# Patient Record
Sex: Female | Born: 1935 | Race: White | Hispanic: No | Smoking: Never smoker
Health system: Southern US, Community
[De-identification: ages and names within clinical notes are randomized; demographics above are authoritative.]

## PROBLEM LIST (undated history)

## (undated) DIAGNOSIS — E785 Hyperlipidemia, unspecified: Secondary | ICD-10-CM

## (undated) DIAGNOSIS — R42 Dizziness and giddiness: Secondary | ICD-10-CM

## (undated) DIAGNOSIS — E079 Disorder of thyroid, unspecified: Secondary | ICD-10-CM

## (undated) DIAGNOSIS — M19019 Primary osteoarthritis, unspecified shoulder: Secondary | ICD-10-CM

## (undated) DIAGNOSIS — M48061 Spinal stenosis, lumbar region without neurogenic claudication: Secondary | ICD-10-CM

## (undated) DIAGNOSIS — C73 Malignant neoplasm of thyroid gland: Secondary | ICD-10-CM

## (undated) DIAGNOSIS — M25311 Other instability, right shoulder: Secondary | ICD-10-CM

## (undated) DIAGNOSIS — I471 Supraventricular tachycardia: Secondary | ICD-10-CM

## (undated) DIAGNOSIS — M353 Polymyalgia rheumatica: Secondary | ICD-10-CM

## (undated) DIAGNOSIS — E119 Type 2 diabetes mellitus without complications: Secondary | ICD-10-CM

## (undated) DIAGNOSIS — M810 Age-related osteoporosis without current pathological fracture: Secondary | ICD-10-CM

## (undated) DIAGNOSIS — I10 Essential (primary) hypertension: Secondary | ICD-10-CM

## (undated) HISTORY — PX: OTHER SURGICAL HISTORY: SHX169

## (undated) HISTORY — PX: THYROIDECTOMY: SHX17

## (undated) HISTORY — PX: ABDOMINAL HYSTERECTOMY: SHX81

## (undated) HISTORY — PX: TOTAL THYROIDECTOMY: SHX2547

## (undated) HISTORY — PX: APPENDECTOMY: SHX54

---

## 2004-01-16 ENCOUNTER — Inpatient Hospital Stay: Payer: Self-pay | Admitting: Internal Medicine

## 2004-06-05 ENCOUNTER — Ambulatory Visit: Payer: Self-pay | Admitting: Internal Medicine

## 2005-02-06 ENCOUNTER — Inpatient Hospital Stay: Payer: Self-pay | Admitting: Internal Medicine

## 2005-03-15 ENCOUNTER — Ambulatory Visit: Payer: Self-pay | Admitting: Infectious Diseases

## 2005-10-21 ENCOUNTER — Ambulatory Visit: Payer: Self-pay | Admitting: Internal Medicine

## 2005-12-31 ENCOUNTER — Emergency Department: Payer: Self-pay | Admitting: Emergency Medicine

## 2005-12-31 ENCOUNTER — Other Ambulatory Visit: Payer: Self-pay

## 2006-01-01 ENCOUNTER — Ambulatory Visit: Payer: Self-pay | Admitting: Emergency Medicine

## 2006-09-26 ENCOUNTER — Emergency Department: Payer: Self-pay | Admitting: Emergency Medicine

## 2006-09-26 ENCOUNTER — Other Ambulatory Visit: Payer: Self-pay

## 2007-02-18 ENCOUNTER — Emergency Department: Payer: Self-pay | Admitting: Emergency Medicine

## 2007-02-18 ENCOUNTER — Other Ambulatory Visit: Payer: Self-pay

## 2007-03-24 ENCOUNTER — Inpatient Hospital Stay: Payer: Self-pay | Admitting: Internal Medicine

## 2007-04-08 ENCOUNTER — Other Ambulatory Visit: Payer: Self-pay

## 2007-04-08 ENCOUNTER — Observation Stay: Payer: Self-pay | Admitting: Internal Medicine

## 2008-08-23 ENCOUNTER — Emergency Department: Payer: Self-pay | Admitting: Unknown Physician Specialty

## 2008-10-19 ENCOUNTER — Ambulatory Visit: Payer: Self-pay | Admitting: Rheumatology

## 2010-04-09 ENCOUNTER — Emergency Department: Payer: Self-pay | Admitting: Emergency Medicine

## 2010-04-15 ENCOUNTER — Emergency Department: Payer: Self-pay | Admitting: Emergency Medicine

## 2010-05-24 ENCOUNTER — Ambulatory Visit: Payer: Self-pay | Admitting: Internal Medicine

## 2010-09-06 ENCOUNTER — Ambulatory Visit: Payer: Self-pay | Admitting: Ophthalmology

## 2010-09-17 ENCOUNTER — Ambulatory Visit: Payer: Self-pay | Admitting: Ophthalmology

## 2010-10-10 ENCOUNTER — Ambulatory Visit: Payer: Self-pay | Admitting: Internal Medicine

## 2010-10-12 ENCOUNTER — Observation Stay: Payer: Self-pay | Admitting: Internal Medicine

## 2011-02-21 ENCOUNTER — Ambulatory Visit: Payer: Self-pay

## 2011-02-21 ENCOUNTER — Ambulatory Visit: Payer: Self-pay | Admitting: Podiatry

## 2011-05-24 ENCOUNTER — Ambulatory Visit: Payer: Self-pay | Admitting: Internal Medicine

## 2011-07-31 ENCOUNTER — Emergency Department: Payer: Self-pay | Admitting: *Deleted

## 2011-07-31 LAB — CBC WITH DIFFERENTIAL/PLATELET
Basophil %: 0.6 %
Eosinophil %: 0.7 %
HGB: 12.2 g/dL (ref 12.0–16.0)
Lymphocyte #: 1.9 10*3/uL (ref 1.0–3.6)
MCH: 28.2 pg (ref 26.0–34.0)
MCV: 85 fL (ref 80–100)
Monocyte #: 0.7 x10 3/mm (ref 0.2–0.9)
Monocyte %: 7.8 %
Neutrophil #: 6 10*3/uL (ref 1.4–6.5)
Platelet: 254 10*3/uL (ref 150–440)
RBC: 4.34 10*6/uL (ref 3.80–5.20)
WBC: 8.6 10*3/uL (ref 3.6–11.0)

## 2011-07-31 LAB — BASIC METABOLIC PANEL
BUN: 22 mg/dL — ABNORMAL HIGH (ref 7–18)
Chloride: 95 mmol/L — ABNORMAL LOW (ref 98–107)
Co2: 31 mmol/L (ref 21–32)
EGFR (Non-African Amer.): 51 — ABNORMAL LOW
Osmolality: 274 (ref 275–301)
Potassium: 4 mmol/L (ref 3.5–5.1)

## 2011-09-26 ENCOUNTER — Emergency Department: Payer: Self-pay | Admitting: Emergency Medicine

## 2011-09-26 LAB — CBC
HCT: 37 % (ref 35.0–47.0)
HGB: 12.3 g/dL (ref 12.0–16.0)
MCH: 28.6 pg (ref 26.0–34.0)
MCHC: 33.3 g/dL (ref 32.0–36.0)
MCV: 86 fL (ref 80–100)
RBC: 4.31 10*6/uL (ref 3.80–5.20)
RDW: 12.7 % (ref 11.5–14.5)
WBC: 7.3 10*3/uL (ref 3.6–11.0)

## 2011-09-26 LAB — COMPREHENSIVE METABOLIC PANEL
Anion Gap: 7 (ref 7–16)
BUN: 16 mg/dL (ref 7–18)
Bilirubin,Total: 0.4 mg/dL (ref 0.2–1.0)
Chloride: 96 mmol/L — ABNORMAL LOW (ref 98–107)
Creatinine: 0.68 mg/dL (ref 0.60–1.30)
EGFR (African American): >60
Osmolality: 270 (ref 275–301)
Potassium: 3.7 mmol/L (ref 3.5–5.1)
Sodium: 133 mmol/L — ABNORMAL LOW (ref 136–145)
Total Protein: 7.4 g/dL (ref 6.4–8.2)

## 2011-09-26 LAB — URINALYSIS, COMPLETE
Blood: NEGATIVE
Ketone: NEGATIVE
Nitrite: NEGATIVE
Ph: 9 (ref 4.5–8.0)
Protein: NEGATIVE
Specific Gravity: 1.006 (ref 1.003–1.030)
WBC UR: 2 /HPF (ref 0–5)

## 2011-09-26 LAB — TROPONIN I: Troponin-I: <0.02 ng/mL

## 2011-09-26 LAB — CK TOTAL AND CKMB (NOT AT ARMC): CK-MB: 3.9 ng/mL — ABNORMAL HIGH (ref 0.5–3.6)

## 2012-01-10 ENCOUNTER — Ambulatory Visit: Payer: Self-pay | Admitting: Internal Medicine

## 2012-03-02 ENCOUNTER — Ambulatory Visit: Payer: Self-pay | Admitting: Orthopedic Surgery

## 2012-03-24 ENCOUNTER — Ambulatory Visit: Payer: Self-pay | Admitting: Vascular Surgery

## 2012-03-24 LAB — BASIC METABOLIC PANEL
Anion Gap: 8 (ref 7–16)
BUN: 30 mg/dL — ABNORMAL HIGH (ref 7–18)
Calcium, Total: 9.7 mg/dL (ref 8.5–10.1)
Chloride: 97 mmol/L — ABNORMAL LOW (ref 98–107)
Co2: 26 mmol/L (ref 21–32)
Creatinine: 1.05 mg/dL (ref 0.60–1.30)
EGFR (African American): 55 — ABNORMAL LOW
EGFR (Non-African Amer.): 47 — ABNORMAL LOW
Sodium: 131 mmol/L — ABNORMAL LOW (ref 136–145)

## 2012-11-26 ENCOUNTER — Ambulatory Visit: Payer: Self-pay | Admitting: Orthopedic Surgery

## 2014-05-20 NOTE — Op Note (Signed)
PATIENT NAME:  Amanda Stark, Amanda Stark MR#:  [id number] DATE OF BIRTH:  May 08, 1922  DATE OF PROCEDURE:  03/24/2012  PREOPERATIVE DIAGNOSIS:  Hematoma, right chest wall and right arm.   POSTOPERATIVE DIAGNOSIS:  Hematoma, right chest wall and right arm.   PROCEDURE PERFORMED:  Venography, right upper extremity.   SURGEON:  Hortencia Pilar, MD  SEDATION:  Versed 3 mg, plus fentanyl 100 mcg administered IV.   Continuous ECG, pulse oximetry, and cardiopulmonary monitoring were performed throughout the entire procedure by the interventional radiology nurse.   Total sedation time was 45 minutes.   ACCESS:  5-French catheter, right basilic vein.   CONTRAST USED:  Isovue 30 mL.   FLUOROSCOPY TIME:  1.1 minutes.   INDICATIONS:  The patient is an 81 year old woman who developed a spontaneous chest wall and right breast hematoma that extended into the axillary area, and subsequently has caused significant swelling and pain in the right upper extremity. Concerns for venous obstruction secondary to external compression were raised, and the patient is therefore undergoing venography to better evaluate the central venous system, in particular the subclavian and axillary veins.   The risks and benefits were reviewed. All questions answered. The patient has agreed to proceed.   PROCEDURE:  The patient was taken to special procedures and placed in the supine position. After adequate sedation was achieved her right arm was extended, prepped and draped in a sterile fashion. Ultrasound was placed in a sterile sleeve. Venous anatomy was examined with the ultrasound as well as the area of hematoma in the right upper arm. The hematoma was clearly compressing the paired brachial veins of the right upper extremity. The cephalic vein was somewhat enlarged the antecubital fossa, and then courses over the hematoma toward the shoulder. The basilic vein appears to be uninvolved and widely patent. It is echolucent and  compressible, indicating patency. Images were recorded for the permanent record, and the puncture with a micropuncture kit is made under direct visualization. A MicroSheath was inserted and hand injection of contrast is utilized through the [employer]. Images in the arm were quite good, but the images of the subclavian and central veins are inadequate from this level, and ultimately a floppy Glidewire was extended, A KMP catheter, but these images still remained uncertain, and therefore a multi-sidehole pigtail catheter was advanced up into the central veins and hand injection of contrast was utilized to demonstrate the complete venous anatomy. After looking at the images, the catheter was removed, pressure was held, and there were no immediate complications.   INTERPRETATION:  The venous anatomy is as described above. The basilic vein is widely patent. Cephalic vein appears to be, patent but is somewhat dilated more distally secondary to  external compression as it courses over the hematoma in the upper arm. Brachial veins appear to be essentially occluded by compression from the hematoma. The axillary vein is widely patent, as is the subclavian, innominate, and superior vena cava. The area of concern appears to have been the confluence of the cephalic vein with the subclavian and non-contrasted blood flowing from the cephalic into the subclavian at that point, with the pigtail catheter in position and a magnified image.   There does not appear to be any evidence of stricture or stenosis throughout the axillary, subclavian, innominate system.    ____________________________ Katha Cabal, MD ggs:dm D: 03/25/2012 08:12:00 ET T: 03/25/2012 09:47:38 ET JOB#: [id number]  cc: Rusty Aus, MD Katha Cabal, MD, <Dictator>    [name] Lory  [facility] MD ELECTRONICALLY SIGNED 04/28/2012 17:14

## 2014-05-20 NOTE — Op Note (Signed)
PATIENT NAME:  Amanda Stark, Amanda Stark MR#:  [id number] DATE OF BIRTH:  March 10, 1922  DATE OF PROCEDURE:  03/25/2012   PREOPERATIVE DIAGNOSIS:  Hematoma, right chest wall and right arm.   POSTOPERATIVE DIAGNOSIS:  Hematoma, right chest wall and right arm.   PROCEDURE PERFORMED:  Venography, right upper extremity.   SURGEON:  Hortencia Pilar, MD  SEDATION:  Versed 3 mg, plus fentanyl 100 mcg administered IV.   Continuous ECG, pulse oximetry, and cardiopulmonary monitoring were performed throughout the entire procedure by the interventional radiology nurse.   Total sedation time was 45 minutes.   ACCESS:  5-French catheter, right basilic vein.   CONTRAST USED:  Isovue 30 mL.   FLUOROSCOPY TIME:  1.1 minutes.   INDICATIONS:  The patient is an 81 year old woman who developed a spontaneous chest wall and right breast hematoma that extended into the axillary area, and subsequently has caused significant swelling and pain in the right upper extremity. Concerns for venous obstruction secondary to external compression were raised, and the patient is therefore undergoing venography to better evaluate the central venous system, in particular the subclavian and axillary veins.   The risks and benefits were reviewed. All questions answered. The patient has agreed to proceed.   PROCEDURE:  The patient was taken to special procedures and placed in the supine position. After adequate sedation was achieved her right arm was extended, prepped and draped in a sterile fashion. Ultrasound was placed in a sterile sleeve. Venous anatomy was examined with the ultrasound as well as the area of hematoma in the right upper arm. The hematoma was clearly compressing the paired brachial veins of the right upper extremity. The cephalic vein was somewhat enlarged the antecubital fossa, and then courses over the hematoma toward the shoulder. The basilic vein appears to be uninvolved and widely patent. It is echolucent and  compressible, indicating patency. Images were recorded for the permanent record, and the puncture with a micropuncture kit is made under direct visualization. A MicroSheath was inserted and hand injection of contrast is utilized through the [employer]. Images in the arm were quite good, but the images of the subclavian and central veins are inadequate from this level, and ultimately a floppy Glidewire was extended, A KMP catheter, but these images still remained uncertain, and therefore a multi-sidehole pigtail catheter was advanced up into the central veins and hand injection of contrast was utilized to demonstrate the complete venous anatomy. After looking at the images, the catheter was removed, pressure was held, and there were no immediate complications.   INTERPRETATION:  The venous anatomy is as described above. The basilic vein is widely patent. Cephalic vein appears to be, patent but is somewhat dilated more distally secondary to  external compression as it courses over the hematoma in the upper arm. Brachial veins appear to be essentially occluded by compression from the hematoma. The axillary vein is widely patent, as is the subclavian, innominate, and superior vena cava. The area of concern appears to have been the confluence of the cephalic vein with the subclavian and non-contrasted blood flowing from the cephalic into the subclavian at that point, with the pigtail catheter in position and a magnified image.   There does not appear to be any evidence of stricture or stenosis throughout the axillary, subclavian, innominate system.     ____________________________ Katha Cabal, MD ggs:dm D: 03/25/2012 08:12:00 ET T: 03/25/2012 09:47:38 ET JOB#: [id number]  cc: Rusty Aus, MD Katha Cabal, MD, <Dictator>

## 2014-08-02 ENCOUNTER — Observation Stay
Admission: EM | Admit: 2014-08-02 | Discharge: 2014-08-03 | Disposition: A | Discharge status: Home / Self Care | Payer: Medicare Other | Attending: Internal Medicine | Admitting: Internal Medicine

## 2014-08-02 ENCOUNTER — Emergency Department: Payer: Medicare Other

## 2014-08-02 ENCOUNTER — Other Ambulatory Visit: Payer: Self-pay

## 2014-08-02 DIAGNOSIS — E785 Hyperlipidemia, unspecified: Secondary | ICD-10-CM | POA: Diagnosis not present

## 2014-08-02 DIAGNOSIS — R000 Tachycardia, unspecified: Secondary | ICD-10-CM | POA: Insufficient documentation

## 2014-08-02 DIAGNOSIS — W19XXXA Unspecified fall, initial encounter: Secondary | ICD-10-CM

## 2014-08-02 DIAGNOSIS — Z794 Long term (current) use of insulin: Secondary | ICD-10-CM | POA: Diagnosis not present

## 2014-08-02 DIAGNOSIS — M810 Age-related osteoporosis without current pathological fracture: Secondary | ICD-10-CM | POA: Diagnosis not present

## 2014-08-02 DIAGNOSIS — K449 Diaphragmatic hernia without obstruction or gangrene: Secondary | ICD-10-CM | POA: Diagnosis not present

## 2014-08-02 DIAGNOSIS — Z9889 Other specified postprocedural states: Secondary | ICD-10-CM | POA: Insufficient documentation

## 2014-08-02 DIAGNOSIS — M353 Polymyalgia rheumatica: Secondary | ICD-10-CM | POA: Insufficient documentation

## 2014-08-02 DIAGNOSIS — M4806 Spinal stenosis, lumbar region: Secondary | ICD-10-CM | POA: Diagnosis not present

## 2014-08-02 DIAGNOSIS — Z85850 Personal history of malignant neoplasm of thyroid: Secondary | ICD-10-CM | POA: Diagnosis not present

## 2014-08-02 DIAGNOSIS — E079 Disorder of thyroid, unspecified: Secondary | ICD-10-CM | POA: Diagnosis not present

## 2014-08-02 DIAGNOSIS — E039 Hypothyroidism, unspecified: Secondary | ICD-10-CM | POA: Diagnosis not present

## 2014-08-02 DIAGNOSIS — I10 Essential (primary) hypertension: Secondary | ICD-10-CM | POA: Insufficient documentation

## 2014-08-02 DIAGNOSIS — E890 Postprocedural hypothyroidism: Secondary | ICD-10-CM | POA: Insufficient documentation

## 2014-08-02 DIAGNOSIS — R42 Dizziness and giddiness: Secondary | ICD-10-CM | POA: Insufficient documentation

## 2014-08-02 DIAGNOSIS — E119 Type 2 diabetes mellitus without complications: Secondary | ICD-10-CM | POA: Insufficient documentation

## 2014-08-02 DIAGNOSIS — Z8501 Personal history of malignant neoplasm of esophagus: Secondary | ICD-10-CM | POA: Diagnosis not present

## 2014-08-02 DIAGNOSIS — I471 Supraventricular tachycardia, unspecified: Secondary | ICD-10-CM | POA: Diagnosis present

## 2014-08-02 DIAGNOSIS — E871 Hypo-osmolality and hyponatremia: Secondary | ICD-10-CM | POA: Diagnosis not present

## 2014-08-02 DIAGNOSIS — I248 Other forms of acute ischemic heart disease: Secondary | ICD-10-CM | POA: Diagnosis present

## 2014-08-02 DIAGNOSIS — Z90710 Acquired absence of both cervix and uterus: Secondary | ICD-10-CM | POA: Insufficient documentation

## 2014-08-02 HISTORY — DX: Other instability, right shoulder: M25.311

## 2014-08-02 HISTORY — DX: Age-related osteoporosis without current pathological fracture: M81.0

## 2014-08-02 HISTORY — DX: Disorder of thyroid, unspecified: E07.9

## 2014-08-02 HISTORY — DX: Type 2 diabetes mellitus without complications: E11.9

## 2014-08-02 HISTORY — DX: Hyperlipidemia, unspecified: E78.5

## 2014-08-02 HISTORY — DX: Polymyalgia rheumatica: M35.3

## 2014-08-02 HISTORY — DX: Malignant neoplasm of thyroid gland: C73

## 2014-08-02 HISTORY — DX: Primary osteoarthritis, unspecified shoulder: M19.019

## 2014-08-02 HISTORY — DX: Supraventricular tachycardia: I47.1

## 2014-08-02 HISTORY — DX: Spinal stenosis, lumbar region without neurogenic claudication: M48.061

## 2014-08-02 HISTORY — DX: Essential (primary) hypertension: I10

## 2014-08-02 LAB — TROPONIN I
TROPONIN I: 0.06 ng/mL — AB (ref <0.031)
Troponin I: 0.04 ng/mL — ABNORMAL HIGH (ref <0.031)

## 2014-08-02 LAB — MAGNESIUM
Magnesium: 1.9 mg/dL (ref 1.7–2.4)
Magnesium: 1.8 mg/dL (ref 1.7–2.4)

## 2014-08-02 LAB — COMPREHENSIVE METABOLIC PANEL
ALT: 18 U/L (ref 14–54)
ANION GAP: 8 (ref 5–15)
AST: 23 U/L (ref 15–41)
Albumin: 3.4 g/dL — ABNORMAL LOW (ref 3.5–5.0)
Alkaline Phosphatase: 78 U/L (ref 38–126)
BUN: 20 mg/dL (ref 6–20)
CALCIUM: 9.2 mg/dL (ref 8.9–10.3)
CO2: 32 mmol/L (ref 22–32)
CREATININE: 0.68 mg/dL (ref 0.44–1.00)
Chloride: 91 mmol/L — ABNORMAL LOW (ref 101–111)
Glucose, Bld: 135 mg/dL — ABNORMAL HIGH (ref 65–99)
Potassium: 4.2 mmol/L (ref 3.5–5.1)
Sodium: 131 mmol/L — ABNORMAL LOW (ref 135–145)
TOTAL PROTEIN: 7.5 g/dL (ref 6.5–8.1)
Total Bilirubin: 0.4 mg/dL (ref 0.3–1.2)

## 2014-08-02 LAB — URINALYSIS COMPLETE WITH MICROSCOPIC (ARMC ONLY)
BILIRUBIN URINE: NEGATIVE
Bacteria, UA: NONE SEEN
Glucose, UA: NEGATIVE mg/dL
Ketones, ur: NEGATIVE mg/dL
Leukocytes, UA: NEGATIVE
Nitrite: NEGATIVE
Protein, ur: NEGATIVE mg/dL
Specific Gravity, Urine: 1.01 (ref 1.005–1.030)
pH: 8 (ref 5.0–8.0)

## 2014-08-02 LAB — GLUCOSE, CAPILLARY
GLUCOSE-CAPILLARY: 56 mg/dL — AB (ref 65–99)
Glucose-Capillary: 147 mg/dL — ABNORMAL HIGH (ref 65–99)
Glucose-Capillary: 131 mg/dL — ABNORMAL HIGH (ref 65–99)
Glucose-Capillary: 85 mg/dL (ref 65–99)

## 2014-08-02 LAB — CBC
HCT: 36.5 % (ref 35.0–47.0)
Hemoglobin: 11.6 g/dL — ABNORMAL LOW (ref 12.0–16.0)
MCH: 24.9 pg — ABNORMAL LOW (ref 26.0–34.0)
MCHC: 31.9 g/dL — ABNORMAL LOW (ref 32.0–36.0)
MCV: 78.1 fL — ABNORMAL LOW (ref 80.0–100.0)
PLATELETS: 366 10*3/uL (ref 150–440)
RBC: 4.68 MIL/uL (ref 3.80–5.20)
RDW: 15.3 % — ABNORMAL HIGH (ref 11.5–14.5)
WBC: 12 10*3/uL — ABNORMAL HIGH (ref 3.6–11.0)

## 2014-08-02 MED ORDER — HEPARIN SODIUM (PORCINE) 5000 UNIT/ML IJ SOLN
5000.0000 [IU] | Freq: Three times a day (TID) | INTRAMUSCULAR | Status: DC
  Administered 2014-08-02 – 2014-08-03 (×4): 5000 [IU] via SUBCUTANEOUS
  Filled 2014-08-02 (×4): qty 1

## 2014-08-02 MED ORDER — LISINOPRIL 10 MG PO TABS: 10.0000 mg | ORAL_TABLET | Freq: Two times a day (BID) | ORAL | Status: DC

## 2014-08-02 MED ORDER — ONDANSETRON HCL 4 MG PO TABS: 4.0000 mg | ORAL_TABLET | Freq: Four times a day (QID) | ORAL | Status: DC | PRN

## 2014-08-02 MED ORDER — ONDANSETRON HCL 4 MG/2ML IJ SOLN: 4.0000 mg | Freq: Four times a day (QID) | INTRAMUSCULAR | Status: DC | PRN

## 2014-08-02 MED ORDER — INSULIN ASPART 100 UNIT/ML ~~LOC~~ SOLN: 0.0000 [IU] | Freq: Every day | SUBCUTANEOUS | Status: DC

## 2014-08-02 MED ORDER — METOPROLOL TARTRATE 50 MG PO TABS: 50.0000 mg | ORAL_TABLET | Freq: Once | ORAL | Status: DC

## 2014-08-02 MED ORDER — METOPROLOL TARTRATE 1 MG/ML IV SOLN
5.0000 mg | Freq: Once | INTRAVENOUS | Status: AC
  Administered 2014-08-02: 5 mg via INTRAVENOUS

## 2014-08-02 MED ORDER — METOPROLOL TARTRATE 1 MG/ML IV SOLN
5.0000 mg | INTRAVENOUS | Status: DC | PRN
  Administered 2014-08-03 (×2): 5 mg via INTRAVENOUS
  Filled 2014-08-02 (×2): qty 5

## 2014-08-02 MED ORDER — SODIUM CHLORIDE 0.9 % IV BOLUS (SEPSIS)
500.0000 mL | Freq: Once | INTRAVENOUS | Status: AC
  Administered 2014-08-02: 500 mL via INTRAVENOUS

## 2014-08-02 MED ORDER — METOPROLOL TARTRATE 50 MG PO TABS: 50.0000 mg | ORAL_TABLET | Freq: Three times a day (TID) | ORAL | Status: DC

## 2014-08-02 MED ORDER — LORAZEPAM 1 MG PO TABS
1.0000 mg | ORAL_TABLET | Freq: Every day | ORAL | Status: DC
  Filled 2014-08-02: qty 1

## 2014-08-02 MED ORDER — PANTOPRAZOLE SODIUM 40 MG PO TBEC
40.0000 mg | DELAYED_RELEASE_TABLET | Freq: Every day | ORAL | Status: DC
  Administered 2014-08-02 – 2014-08-03 (×2): 40 mg via ORAL
  Filled 2014-08-02 (×2): qty 1

## 2014-08-02 MED ORDER — METOPROLOL TARTRATE 25 MG PO TABS: 25.0000 mg | ORAL_TABLET | Freq: Once | ORAL | Status: DC

## 2014-08-02 MED ORDER — METOPROLOL TARTRATE 100 MG PO TABS
100.0000 mg | ORAL_TABLET | Freq: Two times a day (BID) | ORAL | Status: DC
  Administered 2014-08-02 – 2014-08-03 (×3): 100 mg via ORAL
  Filled 2014-08-02 (×3): qty 1

## 2014-08-02 MED ORDER — INSULIN ASPART 100 UNIT/ML ~~LOC~~ SOLN
0.0000 [IU] | Freq: Three times a day (TID) | SUBCUTANEOUS | Status: DC
  Administered 2014-08-02 (×2): 1 [IU] via SUBCUTANEOUS
  Administered 2014-08-03: 5 [IU] via SUBCUTANEOUS
  Administered 2014-08-03: 1 [IU] via SUBCUTANEOUS
  Filled 2014-08-02: qty 1
  Filled 2014-08-02: qty 5
  Filled 2014-08-02 (×2): qty 1

## 2014-08-02 MED ORDER — INSULIN DETEMIR 100 UNIT/ML ~~LOC~~ SOLN
10.0000 [IU] | Freq: Every day | SUBCUTANEOUS | Status: DC
  Filled 2014-08-02 (×2): qty 0.1

## 2014-08-02 MED ORDER — ACETAMINOPHEN 650 MG RE SUPP: 650.0000 mg | Freq: Four times a day (QID) | RECTAL | Status: DC | PRN

## 2014-08-02 MED ORDER — INSULIN DETEMIR 100 UNIT/ML ~~LOC~~ SOLN
8.0000 [IU] | Freq: Every day | SUBCUTANEOUS | Status: DC
  Administered 2014-08-02 – 2014-08-03 (×2): 8 [IU] via SUBCUTANEOUS
  Filled 2014-08-02 (×4): qty 0.08

## 2014-08-02 MED ORDER — SODIUM CHLORIDE 0.9 % IV SOLN
1000.0000 mL | Freq: Once | INTRAVENOUS | Status: AC
  Administered 2014-08-02: 1000 mL via INTRAVENOUS

## 2014-08-02 MED ORDER — LEVOTHYROXINE SODIUM 88 MCG PO TABS: 88.0000 ug | ORAL_TABLET | Freq: Every day | ORAL | Status: DC

## 2014-08-02 MED ORDER — PREDNISONE 10 MG PO TABS
5.0000 mg | ORAL_TABLET | Freq: Every day | ORAL | Status: DC
  Administered 2014-08-03: 5 mg via ORAL
  Filled 2014-08-02: qty 1

## 2014-08-02 MED ORDER — ACETAMINOPHEN 325 MG PO TABS: 650.0000 mg | ORAL_TABLET | Freq: Four times a day (QID) | ORAL | Status: DC | PRN

## 2014-08-02 MED ORDER — INSULIN DETEMIR 100 UNIT/ML ~~LOC~~ SOLN
8.0000 [IU] | Freq: Every day | SUBCUTANEOUS | Status: DC
  Filled 2014-08-02: qty 0.08

## 2014-08-02 MED ORDER — METOPROLOL TARTRATE 25 MG PO TABS
ORAL_TABLET | ORAL | Status: AC
  Filled 2014-08-02: qty 1

## 2014-08-02 MED ORDER — ALBUTEROL SULFATE (2.5 MG/3ML) 0.083% IN NEBU
2.5000 mg | INHALATION_SOLUTION | RESPIRATORY_TRACT | Status: DC | PRN
Start: 2014-08-02 — End: 2014-08-03

## 2014-08-02 MED ORDER — DOXAZOSIN MESYLATE 1 MG PO TABS
1.0000 mg | ORAL_TABLET | Freq: Every day | ORAL | Status: DC
  Filled 2014-08-02: qty 1

## 2014-08-02 MED ORDER — METOPROLOL TARTRATE 1 MG/ML IV SOLN
INTRAVENOUS | Status: AC
  Administered 2014-08-02: 5 mg via INTRAVENOUS
  Filled 2014-08-02: qty 5

## 2014-08-02 MED ORDER — DOCUSATE SODIUM 100 MG PO CAPS
100.0000 mg | ORAL_CAPSULE | Freq: Two times a day (BID) | ORAL | Status: DC
  Administered 2014-08-02 – 2014-08-03 (×3): 100 mg via ORAL
  Filled 2014-08-02 (×3): qty 1

## 2014-08-02 NOTE — H&P (Signed)
[facility] [place] NAME: Amanda Stark    MR#:  [id number]  DATE OF BIRTH:  Jul 05, 1922  DATE OF ADMISSION:  08/02/2014  PRIMARY CARE PHYSICIAN: Rusty Aus., MD   REQUESTING/REFERRING PHYSICIAN: Loney Hering, MD  CHIEF COMPLAINT:   Chief Complaint  Patient presents with  . Fall  Dizziness and fall this am.  HISTORY OF PRESENT ILLNESS:  Amanda Stark  is a 81 y.o. female with a known history of SVT, HTN and DM. The patient reports that she fell this am without injury but has some dizziness. The patient again denies any chest pain or shortness breath nausea or vomiting.  She was noted to have SVT at 130's and treated with NS,  lopressor iv and po in ED.  PAST MEDICAL HISTORY:   Past Medical History  Diagnosis Date  . SVT (supraventricular tachycardia)   . Thyroid disease   . Instability of right shoulder joint   . Glenohumeral arthritis   . Diabetes mellitus without complication   . Hypertension   . Hyperlipidemia   . PMR (polymyalgia rheumatica)   . Lumbar spinal stenosis   . Osteoporosis   . Thyroid cancer   . Esophageal cancer     PAST SURGICAL HISTORY:   Past Surgical History  Procedure Laterality Date  . Total thyroidectomy    . Kidney stone removal    . Right lens implant    . Appendectomy    . Abdominal hysterectomy      SOCIAL HISTORY:   History  Substance Use Topics  . Smoking status: Never Smoker   . Smokeless tobacco: Not on file  . Alcohol Use: No    FAMILY HISTORY:   Family History  Problem Relation Age of Onset  . Cancer Mother   . Cancer Sister     DRUG ALLERGIES:   Allergies  Allergen Reactions  . Ascriptin A-D [Aspirin Buf(Alhyd-Mghyd-Cacar)]   . Codeine   . Hydrocodone   . Statins     Statins-Hmg-CoA Reductase Inhibitors  . Tramadol     REVIEW OF SYSTEMS:  CONSTITUTIONAL: No fever, generalized weakness. Dizziness. EYES: No blurred or double vision.  EARS, NOSE,  AND THROAT: No tinnitus or ear pain.  RESPIRATORY: No cough, shortness of breath, wheezing or hemoptysis.  CARDIOVASCULAR: No chest pain, orthopnea, edema.  GASTROINTESTINAL: No nausea, vomiting, diarrhea or abdominal pain.  GENITOURINARY: No dysuria, hematuria. Incontinence. ENDOCRINE: No polyuria, nocturia,  HEMATOLOGY: No anemia, easy bruising or bleeding SKIN: No rash or lesion. MUSCULOSKELETAL: No joint pain or arthritis.   NEUROLOGIC: No tingling, numbness, weakness.  PSYCHIATRY: No anxiety or depression.   MEDICATIONS AT HOME:   Prior to Admission medications   Medication Sig Start Date End Date Taking? Authorizing Provider  Calcium Carbonate-Vitamin D (CALCIUM + D PO) Take 1 tablet by mouth 2 (two) times daily.   Yes Historical Provider, MD  docusate sodium (COLACE) 100 MG capsule Take 100 mg by mouth 2 (two) times daily.   Yes Historical Provider, MD  insulin detemir (LEVEMIR) 100 UNIT/ML injection Inject 8 Units into the skin daily.    Yes Historical Provider, MD  Iron-Vitamin C 65-125 MG TABS Take 1 tablet by mouth daily.   Yes Historical Provider, MD  levothyroxine (SYNTHROID, LEVOTHROID) 88 MCG tablet Take 88 mcg by mouth daily before breakfast.   Yes Historical Provider, MD  LORazepam (ATIVAN) 1 MG tablet Take 1 mg by mouth at bedtime.   Yes Historical Provider,  MD  metoprolol (LOPRESSOR) 50 MG tablet Take 100 mg by mouth 2 (two) times daily.    Yes Historical Provider, MD  Multiple Vitamins-Minerals (MULTIVITAMIN WITH MINERALS) tablet Take 1 tablet by mouth daily.   Yes Historical Provider, MD  omeprazole (PRILOSEC) 20 MG capsule Take 20 mg by mouth daily.   Yes Historical Provider, MD  oxyCODONE-acetaminophen (PERCOCET/ROXICET) 5-325 MG per tablet Take 1 tablet by mouth 2 (two) times daily as needed for moderate pain.    Yes Historical Provider, MD  pantoprazole (PROTONIX) 40 MG tablet Take 40 mg by mouth daily.   Yes Historical Provider, MD  predniSONE (DELTASONE) 2.5 MG  tablet Take 5 mg by mouth daily with breakfast.   Yes Historical Provider, MD  VITAMIN D, CHOLECALCIFEROL, PO Take 1 tablet by mouth daily.   Yes Historical Provider, MD      VITAL SIGNS:  Blood pressure 108/75, pulse 131, temperature 97.8 F (36.6 C), resp. rate 17, height 5\' 4"  (1.626 m), weight 52.164 kg (115 lb), SpO2 96 %.  PHYSICAL EXAMINATION:  GENERAL:  81 y.o.-year-old patient lying in the bed with no acute distress.  EYES: Pupils equal, round, reactive to light and accommodation. No scleral icterus. Extraocular muscles intact.  HEENT: Head atraumatic, normocephalic. Oropharynx and nasopharynx clear.  NECK:  Supple, no jugular venous distention. No thyroid enlargement, no tenderness.  LUNGS: Normal breath sounds bilaterally, no wheezing, rales,rhonchi or crepitation. No use of accessory muscles of respiration.  CARDIOVASCULAR: S1, S2 normal. No murmurs, rubs, or gallops. Tachycardia. ABDOMEN: Soft, nontender, nondistended. Bowel sounds present. No organomegaly or mass.  EXTREMITIES: No pedal edema, cyanosis, or clubbing.  NEUROLOGIC: Cranial nerves II through XII are intact. Muscle strength 5/5 in all extremities. Sensation intact. Gait not checked.  PSYCHIATRIC: The patient is alert and oriented x 3.  SKIN: No obvious rash, lesion, or ulcer. Bruised on bilateral legs.  LABORATORY PANEL:   CBC  Recent Labs Lab 08/02/14 0629  WBC 12.0*  HGB 11.6*  HCT 36.5  PLT 366   ------------------------------------------------------------------------------------------------------------------  Chemistries   Recent Labs Lab 08/02/14 0629  NA 131*  K 4.2  CL 91*  CO2 32  GLUCOSE 135*  BUN 20  CREATININE 0.68  CALCIUM 9.2  MG 1.9  AST 23  ALT 18  ALKPHOS 78  BILITOT 0.4   ------------------------------------------------------------------------------------------------------------------  Cardiac Enzymes  Recent Labs Lab 08/02/14 0629  TROPONINI 0.04*    ------------------------------------------------------------------------------------------------------------------  RADIOLOGY:  Dg Chest 1 View  08/02/2014   CLINICAL DATA:  Fall with tachycardia  EXAM: CHEST  1 VIEW  COMPARISON:  09/26/2011  FINDINGS: Interface over the peripheral right chest is best ascribed to skin fold. On contemporaneous cervical spine CT there is no apical pneumothorax.  Chronic mild cardiomegaly and aortic tortuosity. Rounded opacity behind the heart correlates with a hiatal hernia.  Hyperinflation, suspect COPD. Peripheral density in the right lower chest correlates with scarring on CT 01/10/2012.  There is a lytic focus in the anterior right second rib with loss of cortex.  IMPRESSION: 1. When accounting for a skin fold on the right, no acute finding. 2. Lytic focus in the anterior right second rib. In this patient with history of malignancy, recommend chest CT follow-up.   Electronically Signed   By: Monte Fantasia M.D.   On: 08/02/2014 07:07   Ct Head Wo Contrast  08/02/2014   CLINICAL DATA:  Status post fall today.  Initial encounter.  EXAM: CT HEAD WITHOUT CONTRAST  CT CERVICAL SPINE WITHOUT  CONTRAST  TECHNIQUE: Multidetector CT imaging of the head and cervical spine was performed following the standard protocol without intravenous contrast. Multiplanar CT image reconstructions of the cervical spine were also generated.  COMPARISON:  Head CT scan 09/26/2011 and 10/12/2010.  FINDINGS: CT HEAD FINDINGS  Hypoattenuation in the subcortical and periventricular deep white matter consistent with chronic microvascular ischemic change is again seen. Small linear calcification which likely lies along the left tentorium is unchanged and likely related to some remote insult. The patient has a remote lacunar infarction in left cerebellum which is new since the most recent CT. No evidence of acute abnormality including hemorrhage, infarct, mass lesion, mass effect, midline shift or abnormal  extra-axial fluid collections identified. The calvarium is intact. There is no hydrocephalus or pneumocephalus.  CT CERVICAL SPINE FINDINGS  Vertebral body height and alignment are maintained. Loss of disc space height is noted at C5-6. The lung apices are clear. Carotid atherosclerotic vascular disease appears worse on the left.  IMPRESSION: No acute abnormality head or cervical spine.  Chronic microvascular ischemic change.  C5-6 degenerative disc disease.  Atherosclerotic vascular disease.   Electronically Signed   By: Inge Rise M.D.   On: 08/02/2014 07:12   Ct Cervical Spine Wo Contrast  08/02/2014   CLINICAL DATA:  Status post fall today.  Initial encounter.  EXAM: CT HEAD WITHOUT CONTRAST  CT CERVICAL SPINE WITHOUT CONTRAST  TECHNIQUE: Multidetector CT imaging of the head and cervical spine was performed following the standard protocol without intravenous contrast. Multiplanar CT image reconstructions of the cervical spine were also generated.  COMPARISON:  Head CT scan 09/26/2011 and 10/12/2010.  FINDINGS: CT HEAD FINDINGS  Hypoattenuation in the subcortical and periventricular deep white matter consistent with chronic microvascular ischemic change is again seen. Small linear calcification which likely lies along the left tentorium is unchanged and likely related to some remote insult. The patient has a remote lacunar infarction in left cerebellum which is new since the most recent CT. No evidence of acute abnormality including hemorrhage, infarct, mass lesion, mass effect, midline shift or abnormal extra-axial fluid collections identified. The calvarium is intact. There is no hydrocephalus or pneumocephalus.  CT CERVICAL SPINE FINDINGS  Vertebral body height and alignment are maintained. Loss of disc space height is noted at C5-6. The lung apices are clear. Carotid atherosclerotic vascular disease appears worse on the left.  IMPRESSION: No acute abnormality head or cervical spine.  Chronic  microvascular ischemic change.  C5-6 degenerative disc disease.  Atherosclerotic vascular disease.   Electronically Signed   By: Inge Rise M.D.   On: 08/02/2014 07:12    EKG:   Orders placed or performed in visit on 07/31/11  . EKG 12-Lead    IMPRESSION AND PLAN:   SVT Fall Hyponatremia HTN DM   Place for observation. Tele monitor. Lopressor iv prn with PO 100 mg bid. Follow up troponin and cardiology consult.  NS iv, f/u BMP and UA.[name] Continue levemir and start sliding scale.      All the records are reviewed and case discussed with ED provider. Management plans discussed with the patient, her daughter and they are in agreement.  CODE STATUS: FULL CODE  TOTAL TIME TAKING CARE OF THIS PATIENT: 52 minutes.    Demetrios Loll M.D on 08/02/2014 at 9:15 AM  Between 7am to 6pm - Pager - [phone]  After 6pm go to [email or web address] - password [email or web address] [facility]  [facility]  Office  [phone]  CC: Primary care physician;  Rusty Aus., MD

## 2014-08-02 NOTE — ED Provider Notes (Signed)
[facility] Emergency Department Provider Note  ____________________________________________  Time seen: Approximately 620 AM  I have reviewed the triage vital signs and the nursing notes.   HISTORY  Chief Complaint Fall    HPI Amanda Stark is a 81 y.o. female who lives at independent living facility and fell this morning. The patient's medical alert bracelet went also EMS came to evaluate her. The patient reports that she has no pain right after the fall did have some dizziness. The patient does have a history of vertigo and has some skin tears. The patient insisted on coming into the emergency department so she is here to be evaluated. The patient fell last week and was seen by Dr. Gilford Rile and everything was unremarkable. The patient according to EMS has a blood sugar of 122. She reports that she does not remember getting up and thinks she might of been going to the bathroom but she is not sure exactly how she fell. The patient again denies no pain has no chest pain or shortness breath nausea or vomiting. She reports that her son asked when called if her heart rate was fast again but EMS reports that it was not fast.   Past Medical History  Diagnosis Date  . SVT (supraventricular tachycardia)   . Thyroid disease   . Instability of right shoulder joint   . Glenohumeral arthritis   . Diabetes mellitus without complication   . Hypertension   . Hyperlipidemia   . PMR (polymyalgia rheumatica)   . Lumbar spinal stenosis   . Osteoporosis   . Thyroid cancer   . Esophageal cancer     Patient Active Problem List   Diagnosis Date Noted  . SVT (supraventricular tachycardia) 08/02/2014  . Hyponatremia 08/02/2014    Past Surgical History  Procedure Laterality Date  . Total thyroidectomy    . Kidney stone removal    . Right lens implant    . Appendectomy    . Abdominal hysterectomy      Current Outpatient Rx  Name  Route  Sig  Dispense  Refill  .  Calcium Carbonate-Vitamin D (CALCIUM + D PO)   Oral   Take 1 tablet by mouth 2 (two) times daily.         [name] docusate sodium (COLACE) 100 MG capsule   Oral   Take 100 mg by mouth 2 (two) times daily.         [name] doxazosin (CARDURA) 2 MG tablet   Oral   Take 1 mg by mouth at bedtime.          . insulin detemir (LEVEMIR) 100 UNIT/ML injection   Subcutaneous   Inject 10 Units into the skin daily.          . Iron-Vitamin C 65-125 MG TABS   Oral   Take 1 tablet by mouth daily.         [name] levothyroxine (SYNTHROID, LEVOTHROID) 88 MCG tablet   Oral   Take 88 mcg by mouth daily before breakfast.         . lisinopril (PRINIVIL,ZESTRIL) 10 MG tablet   Oral   Take 10 mg by mouth 2 (two) times daily.         [name] LORazepam (ATIVAN) 1 MG tablet   Oral   Take 1 mg by mouth at bedtime.         . metoprolol (LOPRESSOR) 50 MG tablet   Oral   Take 50 mg by mouth 3 (three)  times daily.         . Multiple Vitamins-Minerals (MULTIVITAMIN WITH MINERALS) tablet   Oral   Take 1 tablet by mouth daily.         [name] oxyCODONE-acetaminophen (PERCOCET/ROXICET) 5-325 MG per tablet   Oral   Take 1 tablet by mouth 2 (two) times daily as needed for moderate pain.          . pantoprazole (PROTONIX) 40 MG tablet   Oral   Take 40 mg by mouth daily.         . predniSONE (DELTASONE) 2.5 MG tablet   Oral   Take 5 mg by mouth daily with breakfast.         . VITAMIN D, CHOLECALCIFEROL, PO   Oral   Take 1 tablet by mouth daily.           Allergies Ascriptin a-d; Codeine; Hydrocodone; Statins; and Tramadol  Family History  Problem Relation Age of Onset  . Cancer Mother   . Cancer Sister     Social History History  Substance Use Topics  . Smoking status: Not on file  . Smokeless tobacco: Not on file  . Alcohol Use: Not on file    Review of Systems Constitutional: Wall No fever/chills Eyes: No visual changes. ENT: No sore throat. Cardiovascular: Denies chest  pain. Respiratory: Denies shortness of breath. Gastrointestinal: No abdominal pain.  No nausea, no vomiting.   Genitourinary: Negative for dysuria. Musculoskeletal: Negative for back pain. Skin: Negative for rash. Neurological: Dizziness  10-point ROS otherwise negative.  ____________________________________________   PHYSICAL EXAM:  VITAL SIGNS: ED Triage Vitals  Enc Vitals Group     BP 08/02/14 0623 130/104 mmHg     Pulse Rate 08/02/14 0623 131     Resp 08/02/14 0740 20     Temp 08/02/14 0623 97.8 F (36.6 C)     Temp src --      SpO2 08/02/14 0623 98 %     Weight 08/02/14 0623 115 lb (52.164 kg)     Height 08/02/14 0623 5\' 4"  (1.626 m)     Head Cir --      Peak Flow --      Pain Score 08/02/14 0625 0     Pain Loc --      Pain Edu? --      Excl. in [place]? --     Constitutional: Alert and oriented. Well appearing and in no acute distress. Eyes: Conjunctivae are normal. PERRL. EOMI. Head: Atraumatic. Nose: No congestion/rhinnorhea. Mouth/Throat: Mucous membranes are moist.  Oropharynx non-erythematous. Cardiovascular: Normal rate, regular rhythm. Grossly normal heart sounds.  Good peripheral circulation. Respiratory: Normal respiratory effort.  No retractions. Lungs CTAB. Gastrointestinal: Soft and nontender. No distention. No abdominal bruits. No CVA tenderness. Genitourinary: deferred Musculoskeletal: No lower extremity tenderness nor edema.  No joint effusions. Neurologic:  Normal speech and language. No gross focal neurologic deficits are appreciated.  Skin:  Skin is warm, dry and intact. No rash noted. Psychiatric: Mood and affect are normal.   ____________________________________________   LABS (all labs ordered are listed, but only abnormal results are displayed)  Labs Reviewed  CBC - Abnormal; Notable for the following:    WBC 12.0 (*)    Hemoglobin 11.6 (*)    MCV 78.1 (*)    MCH 24.9 (*)    MCHC 31.9 (*)    RDW 15.3 (*)    All other components  within normal limits  TROPONIN I - Abnormal; Notable for  the following:    Troponin I 0.04 (*)    All other components within normal limits  COMPREHENSIVE METABOLIC PANEL - Abnormal; Notable for the following:    Sodium 131 (*)    Chloride 91 (*)    Glucose, Bld 135 (*)    Albumin 3.4 (*)    All other components within normal limits  MAGNESIUM  URINALYSIS COMPLETEWITH MICROSCOPIC ([facility] ONLY)   ____________________________________________  EKG  ED ECG REPORT I, Loney Hering, the attending physician, personally viewed and interpreted this ECG.   Date: 08/02/2014  EKG Time: 625  Rate: 130  Rhythm: SVT at a rate of 130  Axis: Left axis deviation  Intervals:none  ST&T Change: flipped t waves in lead avl  ____________________________________________  RADIOLOGY  CXR: No acute finding, lytic focus in the right anterior second rib with loss of cortex CT head/cspine: No acute abnormality head or cervical spine. Chronic microvascular ischemic disease, C5-6 degenerative disc disease, atherosclerotic vascular disease. ____________________________________________   PROCEDURES  Procedure(s) performed: None  Critical Care performed: No  ____________________________________________   INITIAL IMPRESSION / ASSESSMENT AND PLAN / ED COURSE  Pertinent labs & imaging results that were available during my care of the patient were reviewed by me and considered in my medical decision making (see chart for details).  The patient is a 81 year old female who comes in with a fall and is found to have SVT. The patient does have some mild elevation of her troponin with normal blood pressure. According to her daughter she has had a recent change in her medications to help control this SVT. The patient had a recent fall when she developed the tachycardia previously. The patient will receive some lopressor 5mg  IV and 25mg  orally to help control her symptoms. Patient also received a liter of  normal saline IV. I will admit the patient to the hospitalist service who can further evaluate the patient and determine if there is another cause or better treatment for her symptoms. I discussed this with the patient and her daughter and they do agree with the plan as stated. I also discussed the patient's care with Dr. Geryl Councilman who will accept the patient to the hospitalist service. ____________________________________________   FINAL CLINICAL IMPRESSION(S) / ED DIAGNOSES  Final diagnoses:  Fall, initial encounter  Demand ischemia  SVT (supraventricular tachycardia)      Loney Hering, MD 08/02/14 [id number]

## 2014-08-02 NOTE — ED Notes (Signed)
MD at bedside. 

## 2014-08-02 NOTE — ED Notes (Signed)
Patient from [facility] for a fall. Patient denies pain at the present time but does point to her right leg. SKin tears to bilateral elbows noted. No bleeding at this time.

## 2014-08-02 NOTE — Consult Note (Signed)
[facility] Cardiology  CARDIOLOGY CONSULT NOTE  Patient ID: MADELL HEINO MRN: [id number] DOB/AGE: [birth year] 81 y.o.  Admit date: 08/02/2014 Referring Physician Emily Filbert M.D. Primary Physician Emily Filbert M.D. Primary Cardiologist Jordan Hawks M.D. Reason for Consultation SVT  HPI: 81 year old female with history of SVT who is admitted after falling. The patient reports that this morning, she apparently fell without loss of consciousness. She had difficulty getting up and called [facility] and EMS arrived. Patient was brought to [facility] emergency room where she was noted be in narrow complex tachycardia at a rate of 130 bpm. Patient was treated with intravenous fluids and Lopressor is admitted to telemetry. Patient currently is in sinus rhythm at a rate of 70 bpm. Patient denies chest pain or shortness of breath. She does report she had a similar episode last week, where she fell, without loss of consciousness. Patient was seen by Dr. Emily Filbert as an outpatient, noted to have elevated blood pressure, and metoprolol tartrate was increased to 100 mg twice a day.  Review of systems complete and found to be negative unless listed above     Past Medical History  Diagnosis Date  . SVT (supraventricular tachycardia)   . Thyroid disease   . Instability of right shoulder joint   . Glenohumeral arthritis   . Diabetes mellitus without complication   . Hypertension   . Hyperlipidemia   . PMR (polymyalgia rheumatica)   . Lumbar spinal stenosis   . Osteoporosis   . Thyroid cancer   . Esophageal cancer     Past Surgical History  Procedure Laterality Date  . Total thyroidectomy    . Kidney stone removal    . Right lens implant    . Appendectomy    . Abdominal hysterectomy      Prescriptions prior to admission  Medication Sig Dispense Refill Last Dose  . Calcium Carbonate-Vitamin D (CALCIUM + D PO) Take 1 tablet by mouth 2 (two) times daily.   unknown  . docusate sodium (COLACE) 100 MG capsule Take  100 mg by mouth 2 (two) times daily.   unknown  . insulin detemir (LEVEMIR) 100 UNIT/ML injection Inject 8 Units into the skin daily.    unknown  . Iron-Vitamin C 65-125 MG TABS Take 1 tablet by mouth daily.   unknown  . levothyroxine (SYNTHROID, LEVOTHROID) 88 MCG tablet Take 88 mcg by mouth daily before breakfast.   unknown  . LORazepam (ATIVAN) 1 MG tablet Take 1 mg by mouth at bedtime.   unknown  . metoprolol (LOPRESSOR) 50 MG tablet Take 100 mg by mouth 2 (two) times daily.    unknown  . Multiple Vitamins-Minerals (MULTIVITAMIN WITH MINERALS) tablet Take 1 tablet by mouth daily.   unknown  . omeprazole (PRILOSEC) 20 MG capsule Take 20 mg by mouth daily.   unknown  . oxyCODONE-acetaminophen (PERCOCET/ROXICET) 5-325 MG per tablet Take 1 tablet by mouth 2 (two) times daily as needed for moderate pain.    unknown  . pantoprazole (PROTONIX) 40 MG tablet Take 40 mg by mouth daily.   unknown  . predniSONE (DELTASONE) 2.5 MG tablet Take 5 mg by mouth daily with breakfast.   unknown  . VITAMIN D, CHOLECALCIFEROL, PO Take 1 tablet by mouth daily.   unknown   History   Social History  . Marital Status: Married    Spouse Name: N/A  . Number of Children: N/A  . Years of Education: N/A   Occupational History  . Not on file.  Social History Main Topics  . Smoking status: Never Smoker   . Smokeless tobacco: Never Used  . Alcohol Use: No  . Drug Use: No  . Sexual Activity: Not Currently   Other Topics Concern  . Not on file   Social History Narrative  . No narrative on file    Family History  Problem Relation Age of Onset  . Cancer Mother   . Cancer Sister       Review of systems complete and found to be negative unless listed above      PHYSICAL EXAM  General: Well developed, well nourished, in no acute distress HEENT:  Normocephalic and atramatic Neck:  No JVD.  Lungs: Clear bilaterally to auscultation and percussion. Heart: HRRR . Normal S1 and S2 without gallops or  murmurs.  Abdomen: Bowel sounds are positive, abdomen soft and non-tender  Msk:  Back normal, normal gait. Normal strength and tone for age. Extremities: No clubbing, cyanosis or edema.   Neuro: Alert and oriented X 3. Psych:  Good affect, responds appropriately  Labs:   Lab Results  Component Value Date   WBC 12.0* 08/02/2014   HGB 11.6* 08/02/2014   HCT 36.5 08/02/2014   MCV 78.1* 08/02/2014   PLT 366 08/02/2014    Recent Labs Lab 08/02/14 0629  NA 131*  K 4.2  CL 91*  CO2 32  BUN 20  CREATININE 0.68  CALCIUM 9.2  PROT 7.5  BILITOT 0.4  ALKPHOS 78  ALT 18  AST 23  GLUCOSE 135*   Lab Results  Component Value Date   TROPONINI 0.04* 08/02/2014   No results found for: CHOL No results found for: HDL No results found for: LDLCALC No results found for: TRIG No results found for: CHOLHDL No results found for: LDLDIRECT    Radiology: Dg Chest 1 View  08/02/2014   CLINICAL DATA:  Fall with tachycardia  EXAM: CHEST  1 VIEW  COMPARISON:  09/26/2011  FINDINGS: Interface over the peripheral right chest is best ascribed to skin fold. On contemporaneous cervical spine CT there is no apical pneumothorax.  Chronic mild cardiomegaly and aortic tortuosity. Rounded opacity behind the heart correlates with a hiatal hernia.  Hyperinflation, suspect COPD. Peripheral density in the right lower chest correlates with scarring on CT 01/10/2012.  There is a lytic focus in the anterior right second rib with loss of cortex.  IMPRESSION: 1. When accounting for a skin fold on the right, no acute finding. 2. Lytic focus in the anterior right second rib. In this patient with history of malignancy, recommend chest CT follow-up.   Electronically Signed   By: Monte Fantasia M.D.   On: 08/02/2014 07:07   Ct Head Wo Contrast  08/02/2014   CLINICAL DATA:  Status post fall today.  Initial encounter.  EXAM: CT HEAD WITHOUT CONTRAST  CT CERVICAL SPINE WITHOUT CONTRAST  TECHNIQUE: Multidetector CT imaging of the  head and cervical spine was performed following the standard protocol without intravenous contrast. Multiplanar CT image reconstructions of the cervical spine were also generated.  COMPARISON:  Head CT scan 09/26/2011 and 10/12/2010.  FINDINGS: CT HEAD FINDINGS  Hypoattenuation in the subcortical and periventricular deep white matter consistent with chronic microvascular ischemic change is again seen. Small linear calcification which likely lies along the left tentorium is unchanged and likely related to some remote insult. The patient has a remote lacunar infarction in left cerebellum which is new since the most recent CT. No evidence of acute abnormality  including hemorrhage, infarct, mass lesion, mass effect, midline shift or abnormal extra-axial fluid collections identified. The calvarium is intact. There is no hydrocephalus or pneumocephalus.  CT CERVICAL SPINE FINDINGS  Vertebral body height and alignment are maintained. Loss of disc space height is noted at C5-6. The lung apices are clear. Carotid atherosclerotic vascular disease appears worse on the left.  IMPRESSION: No acute abnormality head or cervical spine.  Chronic microvascular ischemic change.  C5-6 degenerative disc disease.  Atherosclerotic vascular disease.   Electronically Signed   By: Inge Rise M.D.   On: 08/02/2014 07:12   Ct Cervical Spine Wo Contrast  08/02/2014   CLINICAL DATA:  Status post fall today.  Initial encounter.  EXAM: CT HEAD WITHOUT CONTRAST  CT CERVICAL SPINE WITHOUT CONTRAST  TECHNIQUE: Multidetector CT imaging of the head and cervical spine was performed following the standard protocol without intravenous contrast. Multiplanar CT image reconstructions of the cervical spine were also generated.  COMPARISON:  Head CT scan 09/26/2011 and 10/12/2010.  FINDINGS: CT HEAD FINDINGS  Hypoattenuation in the subcortical and periventricular deep white matter consistent with chronic microvascular ischemic change is again seen.  Small linear calcification which likely lies along the left tentorium is unchanged and likely related to some remote insult. The patient has a remote lacunar infarction in left cerebellum which is new since the most recent CT. No evidence of acute abnormality including hemorrhage, infarct, mass lesion, mass effect, midline shift or abnormal extra-axial fluid collections identified. The calvarium is intact. There is no hydrocephalus or pneumocephalus.  CT CERVICAL SPINE FINDINGS  Vertebral body height and alignment are maintained. Loss of disc space height is noted at C5-6. The lung apices are clear. Carotid atherosclerotic vascular disease appears worse on the left.  IMPRESSION: No acute abnormality head or cervical spine.  Chronic microvascular ischemic change.  C5-6 degenerative disc disease.  Atherosclerotic vascular disease.   Electronically Signed   By: Inge Rise M.D.   On: 08/02/2014 07:12    EKG: narrow complex tachycardia at a rate of 130 bpm consistent with junctional tachycardia, does not appear to be atrial tachycardia or atrial flutter  ASSESSMENT AND PLAN:   81 year old female with history of supraventricular tachycardia, who presents after falling and having difficulty getting up, noted to be in narrow complex tachycardia at a rate of 130 bpm. Patient treated with intravenous Lopressor, currently in sinus rhythm.  Recommendations  1. Continue current medications 2. Consider adding Cardizem elevated blood pressure or recurrent SVT 3. Consider 30 day loop monitor following discharge to correlate symptoms with episodes of SVT  Signed: Tylique Aull MD,PhD, [facility] 08/02/2014, 1:22 PM

## 2014-08-02 NOTE — Progress Notes (Signed)
   08/02/14 1500  Clinical Encounter Type  Visited With Patient and family together  Visit Type Initial  Referral From Nurse  Consult/Referral To Chaplain  Spiritual Encounters  Spiritual Needs Brochure  Stress Factors  Patient Stress Factors None identified   Faith tradition: [employer] Status: alert and oriented/SVT (supraventricular tachycardia) Family: Daughter bedside Visit Assessment: The daughter and patient requested Adv Directive education. Chaplain gave the brief lesson and issued the packet for their family meeting. The patient inquired about the DNR process but did not make decision or request to see the physician to make the order; The patient and daughter shared stories about their cow advocacy and children's simulation program.  Chaplains and pastoral care can be reached via online request or pager [phone]

## 2014-08-03 ENCOUNTER — Encounter
Admission: RE | Admit: 2014-08-03 | Discharge: 2014-08-03 | Disposition: A | Discharge status: Home / Self Care | Payer: Medicare Other | Source: Ambulatory Visit | Attending: Internal Medicine | Admitting: Internal Medicine

## 2014-08-03 DIAGNOSIS — R410 Disorientation, unspecified: Secondary | ICD-10-CM | POA: Insufficient documentation

## 2014-08-03 DIAGNOSIS — I471 Supraventricular tachycardia: Secondary | ICD-10-CM | POA: Insufficient documentation

## 2014-08-03 DIAGNOSIS — I10 Essential (primary) hypertension: Secondary | ICD-10-CM | POA: Insufficient documentation

## 2014-08-03 DIAGNOSIS — E119 Type 2 diabetes mellitus without complications: Secondary | ICD-10-CM | POA: Insufficient documentation

## 2014-08-03 LAB — BASIC METABOLIC PANEL
ANION GAP: 6 (ref 5–15)
BUN: 23 mg/dL — ABNORMAL HIGH (ref 6–20)
CALCIUM: 8.4 mg/dL — AB (ref 8.9–10.3)
CHLORIDE: 94 mmol/L — AB (ref 101–111)
CO2: 29 mmol/L (ref 22–32)
Creatinine, Ser: 0.68 mg/dL (ref 0.44–1.00)
GFR calc Af Amer: >60 mL/min (ref >60)
GFR calc non Af Amer: >60 mL/min (ref >60)
GLUCOSE: 160 mg/dL — AB (ref 65–99)
POTASSIUM: 4.6 mmol/L (ref 3.5–5.1)
Sodium: 129 mmol/L — ABNORMAL LOW (ref 135–145)

## 2014-08-03 LAB — CBC
HCT: 31.8 % — ABNORMAL LOW (ref 35.0–47.0)
Hemoglobin: 10.5 g/dL — ABNORMAL LOW (ref 12.0–16.0)
MCH: 25.4 pg — ABNORMAL LOW (ref 26.0–34.0)
MCHC: 32.9 g/dL (ref 32.0–36.0)
MCV: 77.2 fL — ABNORMAL LOW (ref 80.0–100.0)
Platelets: 340 10*3/uL (ref 150–440)
RBC: 4.12 MIL/uL (ref 3.80–5.20)
RDW: 15.3 % — AB (ref 11.5–14.5)
WBC: 10 10*3/uL (ref 3.6–11.0)

## 2014-08-03 LAB — TSH: TSH: 32.3 u[IU]/mL — ABNORMAL HIGH (ref 0.350–4.500)

## 2014-08-03 LAB — GLUCOSE, CAPILLARY
GLUCOSE-CAPILLARY: 115 mg/dL — AB (ref 65–99)
GLUCOSE-CAPILLARY: 211 mg/dL — AB (ref 65–99)
GLUCOSE-CAPILLARY: 287 mg/dL — AB (ref 65–99)
Glucose-Capillary: 125 mg/dL — ABNORMAL HIGH (ref 65–99)

## 2014-08-03 MED ORDER — LEVOTHYROXINE SODIUM 100 MCG PO TABS
100.0000 ug | ORAL_TABLET | Freq: Every day | ORAL | Status: DC
  Administered 2014-08-03: 100 ug via ORAL
  Filled 2014-08-03: qty 1

## 2014-08-03 MED ORDER — LEVOTHYROXINE SODIUM 100 MCG PO TABS: 100.0000 ug | ORAL_TABLET | Freq: Every day | ORAL | Status: DC

## 2014-08-03 NOTE — Progress Notes (Signed)
Pt's SBP remained over 150, PRN IV metoprolol given, & will recheck in 30 mins, pt asymptomatic, will continue to monitor. Conley Simmonds, RN

## 2014-08-03 NOTE — Discharge Summary (Signed)
Amanda Stark, is a 81 y.o. female  DOB 07-05-1922  MRN [id number].  Admission date:  08/02/2014  Admitting Physician  Rusty Aus, MD  Discharge Date:  08/03/2014    Admission Diagnosis  SVT (supraventricular tachycardia) [I47.1] Demand ischemia [I24.8] Fall, initial encounter [W19.XXXA]  Discharge Diagnoses    Supraventricular tachycardia Hypothyroid Hypertension Polymyalgia rheumatica Lumbar spinal stenosis   Past Medical History  Diagnosis Date  . SVT (supraventricular tachycardia)   . Thyroid disease   . Instability of right shoulder joint   . Glenohumeral arthritis   . Diabetes mellitus without complication   . Hypertension   . Hyperlipidemia   . PMR (polymyalgia rheumatica)   . Lumbar spinal stenosis   . Osteoporosis   . Thyroid cancer   . Esophageal cancer     Past Surgical History  Procedure Laterality Date  . Total thyroidectomy    . Kidney stone removal    . Right lens implant    . Appendectomy    . Abdominal hysterectomy         History of present illness and  Hospital Course:     Kindly see H&P for history of present illness and admission details, please review complete Labs, Consult reports and Test reports for all details in brief  HPI  from the history and physical done on the day of admission    Hospital Course    Patient was admitted and her metoprolol dose increased to 100 mg twice a day. She went back to normal sinus rhythm and is asymptomatic. She has not been taking her medicine correctly, likely drawing her into this abnormality. For now her lisinopril and Cardura medications will be held.   Discharge Condition: Stable   Follow UP  Dr. Sabra Heck one week    Discharge Instructions  and  Discharge Medications  Calcium with vitamin D twice a day Colace 100 mg twice a day Levemir insulin 8 units at bedtime Vitron C iron pill daily Ativan 1 mg at  bedtime Metoprolol tartrate 50 mg 2 tabs twice daily Multiple vitamin daily Omeprazole 20 mg daily Percocet 5/325 one tab twice a day Prednisone 5 g daily Vitamin D daily Synthroid 100 g daily    Today   Subjective:   Amanda Stark today is asymptomatic and ready to go home.   Objective:   Blood pressure 164/67, pulse 73, temperature 98.4 F (36.9 C), temperature source Oral, resp. rate 18, height 5\' 3"  (1.6 m), weight 47.356 kg (104 lb 6.4 oz), SpO2 97 %.   Exam Awake Alert, Oriented x 3, No new F.N deficits, Normal affect [place].AT,PERRAL Supple Neck,No JVD, No cervical lymphadenopathy appriciated.  Symmetrical Chest wall movement, Good air movement bilaterally, CTAB RRR,No Gallops,Rubs or new Murmurs, Abd Soft, Non tender, No rebound. No Cyanosis, Clubbing or edema, No new Rash or bruise  Total Time in preparing paper work, data evaluation and todays exam - 35 minutes  Amanda Stark F. M.D on 08/03/2014 at 7:24 AM

## 2014-08-03 NOTE — Plan of Care (Signed)
Problem: Discharge Progression Outcomes Goal: Other Discharge Outcomes/Goals Outcome: Completed/Met Date Met:  08/03/14 Took over care at 15:20 for Amanda Stark, pt is alert and oriented, forgetful at times, on room air, denies chest pain, d/c to home with home health, educated daughter and patient on updated medications, prescriptions given to daughter this morning by Dr. Sabra Heck, pt up to bathroom with stand by assist, pushed to visitor entrance via nursing staff. Uneventful shift.

## 2014-08-03 NOTE — Care Management (Signed)
It is reported during progression that patient is from [facility] but upon further discussion it was discovered that patient is from [employer] independent apartment on the grounds of [employer].  There is concern that patient is not able to stand.  Discussed obtaining PT consult.  Family has requested meeting with CSW to discuss higher level of care.  CSW will notify this CM if there are any home heath needs

## 2014-08-03 NOTE — Care Management (Signed)
Physical therapy is recommending that patient can discharge home with home health physical therapy.  Patient has walker at home.  She will also benefit from home health nurse for skilled monitoring and assessment.  Patient is in agreement with this plan.  She verbalizes that "I know I am [age] but I want to make my own decisions."  No agency preference- referral to [facility] for physical therapy and nursing

## 2014-08-03 NOTE — Clinical Social Work Note (Signed)
CSW spoke to pt.  She had just been moved from the bed to the recliner.  Pt  Was awake and oriented x4 and sitting up in the chair legs extended and crossed at the ankles.  She stated that she would be in favor of DC to SNF if needed.  CSW then contacted next of kin pt's daughter daughter [name] [phone].  [name] and son stated that they would prefer to handle pt's placement.  CSW notified family that [place] would still need to discuss DC plans with pt as long as pt was competent enough to make her own decisions.  [name] stated that she understood this.  CSW explained what SNF was and gave them a SNF packet to look over.  CSW also asked the pt be seen by PT before being placed.  Per PT, pt is ambulating well enough at her base line to go back to her independent living with some home health.  CSW notified family that SNF placement was not recommended at this time and she does not qualify to go to SNF at this point in time.  RNCM notified that [facility] was recommended at this time.  CSW signing off.

## 2014-08-03 NOTE — Evaluation (Signed)
Physical Therapy Evaluation Patient Details Name: Amanda Stark MRN: [id number] DOB: January 22, 1923 Today's Date: 08/03/2014   History of Present Illness  Pt is a 81 year old female who was admitted to the hospital for falls at home and SVT.   Clinical Impression  Pt presents with hx of SVT, HTN, DM, thyroid disease, hyperlipidemia, osteoporosis, PMR, thyroid cancer and esophageal cancer. Examination reveals that pt performs bed mobility independently, transfers at modified independence, and ambulates 150 ft with RW at supervision. It was noted that pt has mild balance issues with ambulation (cross-stepping/staggering), however this was noted to be the pt's baseline. It is not the therapist's opinion that this gait pattern is unsafe for the pt, but it is conceded by the PT that there are improvements to be made in her gait pattern, which is why HHPT is the PT recommendation. Pt also performed a 5 time sit-to-stand in 16.5 seconds, which is a good time for a woman of her age. By all physical accounts, pt appears to be doing quite well.     Follow Up Recommendations Home health PT    Equipment Recommendations       Recommendations for Other Services       Precautions / Restrictions Precautions Precautions: None Restrictions Weight Bearing Restrictions: No      Mobility  Bed Mobility Overal bed mobility: Independent             General bed mobility comments: Needs no assistance  Transfers Overall transfer level: Modified independent Equipment used: Rolling walker (2 wheeled)             General transfer comment: Pt is able to transfer with RW, but requires no assistance from therapist.   Ambulation/Gait Ambulation/Gait assistance: Modified independent (Device/Increase time);Supervision Ambulation Distance (Feet): 150 Feet Assistive device: Rolling walker (2 wheeled) Gait Pattern/deviations: Step-through pattern;Staggering left;Staggering right (mild staggering) Gait  velocity: decreased   General Gait Details: Pt able to ambulate at supervision with RW. Mild LOBs observed by PT, however it was brought to the therapist's attention by the pt that this is how she walked prior to arriving.   Stairs            Wheelchair Mobility    Modified Rankin (Stroke Patients Only)       Balance Overall balance assessment: No apparent balance deficits (not formally assessed) (At baseline)                                           Pertinent Vitals/Pain Pain Assessment: No/denies pain    Home Living Family/patient expects to be discharged to:: Assisted living Living Arrangements: Alone             Home Equipment: Walker - 2 wheels Additional Comments: Pt is currently in the indep care section of her assisted living facility.     Prior Function Level of Independence: Independent with assistive device(s)         Comments: Pt was able to ambulate, perform ADLs and drive prior to admission      Hand Dominance        Extremity/Trunk Assessment   Upper Extremity Assessment: Overall WFL for tasks assessed           Lower Extremity Assessment: Overall WFL for tasks assessed         Communication   Communication: No difficulties  Cognition Arousal/Alertness: Awake/alert  Behavior During Therapy: WFL for tasks assessed/performed Overall Cognitive Status: Within Functional Limits for tasks assessed                      General Comments      Exercises        Assessment/Plan    PT Assessment Patient needs continued PT services  PT Diagnosis Difficulty walking;Abnormality of gait   PT Problem List Decreased mobility;Decreased balance;Decreased range of motion;Decreased knowledge of use of DME;Cardiopulmonary status limiting activity  PT Treatment Interventions DME instruction;Gait training;Stark training;Functional mobility training;Therapeutic activities;Neuromuscular re-education;Balance  training;Therapeutic exercise;Patient/family education   PT Goals (Current goals can be found in the Care Plan section) Acute Rehab PT Goals Patient Stated Goal: To return home PT Goal Formulation: With patient Time For Goal Achievement: 08/17/14 Potential to Achieve Goals: Good    Frequency Min 2X/week   Barriers to discharge        Co-evaluation               End of Session Equipment Utilized During Treatment: Gait belt Activity Tolerance: Patient tolerated treatment well Patient left: in bed;with bed alarm set;with call bell/phone within reach Nurse Communication: Mobility status         Time: 5681-2751 PT Time Calculation (min) (ACUTE ONLY): 16 min   Charges:         PT G CodesJanyth Contes 09-01-2014, 2:35 PM  Janyth Contes, SPT. [phone]

## 2014-08-03 NOTE — Evaluation (Signed)
Physical Therapy Evaluation Patient Details Name: Amanda Stark MRN: [id number] DOB: 08-23-1922 Today's Date: 08/03/2014   History of Present Illness  Pt is a 81 year old female who was admitted to the hospital for falls at home and SVT.   Clinical Impression  Pt presents with hx of SVT, HTN, DM, thyroid disease, hyperlipidemia, osteoporosis, PMR, thyroid cancer and esophageal cancer. Examination reveals that pt performs bed mobility independently, transfers at modified independence, and ambulates 150 ft with RW at supervision. It was noted that pt has mild balance issues with ambulation (cross-stepping/staggering), however this was noted to be the pt's baseline. It is not the therapist's opinion that this gait pattern is unsafe for the pt, but it is conceded by the PT that there are improvements to be made in her gait pattern, which is why HHPT is the PT recommendation. Pt also performed a 5 time sit-to-stand in 16.5 seconds, which is a good time for a woman of her age. By all physical accounts, pt appears to be doing quite well. This entire session was guided, instructed, and directly supervised by Greggory Stallion, DPT.     Follow Up Recommendations Home health PT    Equipment Recommendations       Recommendations for Other Services       Precautions / Restrictions Precautions Precautions: None Restrictions Weight Bearing Restrictions: No      Mobility  Bed Mobility Overal bed mobility: Independent             General bed mobility comments: Needs no assistance  Transfers Overall transfer level: Modified independent Equipment used: Rolling walker (2 wheeled)             General transfer comment: Pt is able to transfer with RW, but requires no assistance from therapist.   Ambulation/Gait Ambulation/Gait assistance: Modified independent (Device/Increase time);Supervision Ambulation Distance (Feet): 150 Feet Assistive device: Rolling walker (2 wheeled) Gait  Pattern/deviations: Step-through pattern;Staggering left;Staggering right (mild staggering) Gait velocity: decreased   General Gait Details: Pt able to ambulate at supervision with RW. Mild LOBs observed by PT, however it was brought to the therapist's attention by the pt that this is how she walked prior to arriving.   Stairs            Wheelchair Mobility    Modified Rankin (Stroke Patients Only)       Balance Overall balance assessment: No apparent balance deficits (not formally assessed) (At baseline)                                           Pertinent Vitals/Pain Pain Assessment: No/denies pain    Home Living Family/patient expects to be discharged to:: Assisted living Living Arrangements: Alone             Home Equipment: Walker - 2 wheels Additional Comments: Pt is currently in the indep care section of her assisted living facility.     Prior Function Level of Independence: Independent with assistive device(s)         Comments: Pt was able to ambulate, perform ADLs and drive prior to admission      Hand Dominance        Extremity/Trunk Assessment   Upper Extremity Assessment: Overall WFL for tasks assessed           Lower Extremity Assessment: Overall WFL for tasks assessed  Communication   Communication: No difficulties  Cognition Arousal/Alertness: Awake/alert Behavior During Therapy: WFL for tasks assessed/performed Overall Cognitive Status: Within Functional Limits for tasks assessed                      General Comments      Exercises        Assessment/Plan    PT Assessment Patient needs continued PT services  PT Diagnosis Difficulty walking;Abnormality of gait   PT Problem List Decreased mobility;Decreased balance;Decreased range of motion;Decreased knowledge of use of DME;Cardiopulmonary status limiting activity  PT Treatment Interventions DME instruction;Gait training;Stair  training;Functional mobility training;Therapeutic activities;Neuromuscular re-education;Balance training;Therapeutic exercise;Patient/family education   PT Goals (Current goals can be found in the Care Plan section) Acute Rehab PT Goals Patient Stated Goal: To return home PT Goal Formulation: With patient Time For Goal Achievement: 08/17/14 Potential to Achieve Goals: Good    Frequency Min 2X/week   Barriers to discharge        Co-evaluation               End of Session Equipment Utilized During Treatment: Gait belt Activity Tolerance: Patient tolerated treatment well Patient left: in bed;with bed alarm set;with call bell/phone within reach Nurse Communication: Mobility status    Functional Assessment Tool Used: 5 time sit<>Stand Functional Limitation: Mobility: Walking and moving around Mobility: Walking and Moving Around Current Status (Z6109): At least 1 percent but less than 20 percent impaired, limited or restricted Mobility: Walking and Moving Around Goal Status [phone]): 0 percent impaired, limited or restricted    Time: 0981-1914 PT Time Calculation (min) (ACUTE ONLY): 16 min   Charges:   PT Evaluation $Initial PT Evaluation Tier I: 1 Procedure     PT G Codes:   PT G-Codes **NOT FOR INPATIENT CLASS** Functional Assessment Tool Used: 5 time sit<>Stand Functional Limitation: Mobility: Walking and moving around Mobility: Walking and Moving Around Current Status (N8295): At least 1 percent but less than 20 percent impaired, limited or restricted Mobility: Walking and Moving Around Goal Status [phone]): 0 percent impaired, limited or restricted    [facility] 08/03/2014, 2:45 PM Janyth Contes, SPT. [phone]

## 2014-08-07 ENCOUNTER — Emergency Department
Admission: EM | Admit: 2014-08-07 | Discharge: 2014-08-08 | Disposition: A | Discharge status: Home / Self Care | Payer: Medicare Other | Attending: Emergency Medicine | Admitting: Emergency Medicine

## 2014-08-07 ENCOUNTER — Encounter: Payer: Self-pay | Admitting: Emergency Medicine

## 2014-08-07 ENCOUNTER — Other Ambulatory Visit: Payer: Self-pay

## 2014-08-07 DIAGNOSIS — Z79899 Other long term (current) drug therapy: Secondary | ICD-10-CM | POA: Insufficient documentation

## 2014-08-07 DIAGNOSIS — I10 Essential (primary) hypertension: Secondary | ICD-10-CM | POA: Diagnosis not present

## 2014-08-07 DIAGNOSIS — Z7952 Long term (current) use of systemic steroids: Secondary | ICD-10-CM | POA: Insufficient documentation

## 2014-08-07 DIAGNOSIS — W19XXXD Unspecified fall, subsequent encounter: Secondary | ICD-10-CM

## 2014-08-07 DIAGNOSIS — W1839XD Other fall on same level, subsequent encounter: Secondary | ICD-10-CM | POA: Insufficient documentation

## 2014-08-07 DIAGNOSIS — S81801D Unspecified open wound, right lower leg, subsequent encounter: Secondary | ICD-10-CM | POA: Insufficient documentation

## 2014-08-07 NOTE — ED Notes (Signed)
Pt. Here via EMS from [facility] (retired living, not assisted living) for fall.  Pt. Denies hitting head or LOC.   EMS report pt fell out of bed.  Pt. Has a bandaged rt. Lower extremity.  EMS report skin tear to lower rt. Leg.

## 2014-08-07 NOTE — ED Notes (Signed)
Pt. Released from [facility] last week for fall.  Family and pt. State 3 fall in as many weeks.

## 2014-08-07 NOTE — ED Notes (Signed)
Pt. Has a bandaged rt. Lower extremity due to fall from bed.  EMS report skin tear to lower rt. Extremity.

## 2014-08-08 ENCOUNTER — Emergency Department: Payer: Medicare Other

## 2014-08-08 LAB — CBC WITH DIFFERENTIAL/PLATELET
BASOS PCT: 0 %
Basophils Absolute: 0 10*3/uL (ref 0–0.1)
EOS ABS: 0.1 10*3/uL (ref 0–0.7)
Eosinophils Relative: 1 %
HCT: 31.9 % — ABNORMAL LOW (ref 35.0–47.0)
Hemoglobin: 10.2 g/dL — ABNORMAL LOW (ref 12.0–16.0)
Lymphocytes Relative: 22 %
Lymphs Abs: 2.3 10*3/uL (ref 1.0–3.6)
MCH: 25.1 pg — ABNORMAL LOW (ref 26.0–34.0)
MCHC: 32.1 g/dL (ref 32.0–36.0)
MCV: 78.1 fL — ABNORMAL LOW (ref 80.0–100.0)
MONO ABS: 0.9 10*3/uL (ref 0.2–0.9)
MONOS PCT: 9 %
Neutro Abs: 6.9 10*3/uL — ABNORMAL HIGH (ref 1.4–6.5)
Neutrophils Relative %: 68 %
PLATELETS: 334 10*3/uL (ref 150–440)
RBC: 4.08 MIL/uL (ref 3.80–5.20)
RDW: 15.5 % — ABNORMAL HIGH (ref 11.5–14.5)
WBC: 10.3 10*3/uL (ref 3.6–11.0)

## 2014-08-08 LAB — URINALYSIS COMPLETE WITH MICROSCOPIC (ARMC ONLY)
Bacteria, UA: NONE SEEN
Bilirubin Urine: NEGATIVE
Glucose, UA: 50 mg/dL — AB
KETONES UR: NEGATIVE mg/dL
Leukocytes, UA: NEGATIVE
Nitrite: NEGATIVE
PH: 9 — AB (ref 5.0–8.0)
Protein, ur: NEGATIVE mg/dL
Specific Gravity, Urine: 1.005 (ref 1.005–1.030)
Squamous Epithelial / LPF: NONE SEEN

## 2014-08-08 LAB — TROPONIN I: Troponin I: <0.03 ng/mL (ref <0.031)

## 2014-08-08 LAB — GLUCOSE, CAPILLARY: Glucose-Capillary: 79 mg/dL (ref 65–99)

## 2014-08-08 LAB — COMPREHENSIVE METABOLIC PANEL
ALT: 16 U/L (ref 14–54)
AST: 20 U/L (ref 15–41)
Albumin: 3.2 g/dL — ABNORMAL LOW (ref 3.5–5.0)
Alkaline Phosphatase: 88 U/L (ref 38–126)
Anion gap: 9 (ref 5–15)
BILIRUBIN TOTAL: 0.7 mg/dL (ref 0.3–1.2)
BUN: 20 mg/dL (ref 6–20)
CO2: 31 mmol/L (ref 22–32)
Calcium: 9.3 mg/dL (ref 8.9–10.3)
Chloride: 91 mmol/L — ABNORMAL LOW (ref 101–111)
Creatinine, Ser: 0.57 mg/dL (ref 0.44–1.00)
GFR calc Af Amer: >60 mL/min (ref >60)
GLUCOSE: 79 mg/dL (ref 65–99)
POTASSIUM: 4.1 mmol/L (ref 3.5–5.1)
Sodium: 131 mmol/L — ABNORMAL LOW (ref 135–145)
Total Protein: 7.1 g/dL (ref 6.5–8.1)

## 2014-08-08 NOTE — ED Notes (Signed)
Patient transported to X-ray 

## 2014-08-08 NOTE — ED Provider Notes (Signed)
[facility] Emergency Department Provider Note  ____________________________________________  Time seen: Approximately 12:28 AM  I have reviewed the triage vital signs and the nursing notes.   HISTORY  Chief Complaint Fall    HPI Amanda Stark is a 81 y.o. female who presents to the ED via EMS for a chief complaint of fall. Patient lives independently in the [place] portion of [place]; states she was getting up to use her walker to ambulate to the restroom when her feet slipped out from under her. Denies striking head or LOC. Complains of a skin tear to right leg.  Family is very concerned that this is patient's third fall in 2 weeks. She was recently admitted to the hospital s/p fall; she was found to be in SVT with an elevated troponin. While in the hospital, the social worker did discuss with patient to go to a higher level of care. However, it was ultimately decided she could return to live independently with physical therapy. Family state they are working to place her at [facility] but were told it would be at least 1 month before everything would be set in place.  Patient denies chest pain, shortness of breath, abdominal pain, vomiting, diarrhea, hip pain. Tetanus shot is up-to-date.   Past Medical History  Diagnosis Date  . SVT (supraventricular tachycardia)   . Thyroid disease   . Instability of right shoulder joint   . Glenohumeral arthritis   . Diabetes mellitus without complication   . Hypertension   . Hyperlipidemia   . PMR (polymyalgia rheumatica)   . Lumbar spinal stenosis   . Osteoporosis   . Thyroid cancer   . Esophageal cancer     Patient Active Problem List   Diagnosis Date Noted  . SVT (supraventricular tachycardia) 08/02/2014  . Hyponatremia 08/02/2014    Past Surgical History  Procedure Laterality Date  . Total thyroidectomy    . Kidney stone removal    . Right lens implant    . Appendectomy    . Abdominal  hysterectomy    . Thyroidectomy    . Back surgery      Current Outpatient Rx  Name  Route  Sig  Dispense  Refill  . Calcium Carbonate-Vitamin D (CALCIUM + D PO)   Oral   Take 1 tablet by mouth 2 (two) times daily.         [name] docusate sodium (COLACE) 100 MG capsule   Oral   Take 100 mg by mouth 2 (two) times daily.         . insulin detemir (LEVEMIR) 100 UNIT/ML injection   Subcutaneous   Inject 8 Units into the skin daily.          . Iron-Vitamin C 65-125 MG TABS   Oral   Take 1 tablet by mouth daily.         [name] levothyroxine (SYNTHROID, LEVOTHROID) 100 MCG tablet   Oral   Take 1 tablet (100 mcg total) by mouth daily before breakfast.   30 tablet   11   . LORazepam (ATIVAN) 1 MG tablet   Oral   Take 1 mg by mouth at bedtime.         . metoprolol (LOPRESSOR) 50 MG tablet   Oral   Take 100 mg by mouth 2 (two) times daily.          . Multiple Vitamins-Minerals (MULTIVITAMIN WITH MINERALS) tablet   Oral   Take 1 tablet by mouth daily.         [name]  omeprazole (PRILOSEC) 20 MG capsule   Oral   Take 20 mg by mouth daily.         [name] oxyCODONE-acetaminophen (PERCOCET/ROXICET) 5-325 MG per tablet   Oral   Take 1 tablet by mouth 2 (two) times daily as needed for moderate pain.          . predniSONE (DELTASONE) 2.5 MG tablet   Oral   Take 5 mg by mouth daily with breakfast.         . VITAMIN D, CHOLECALCIFEROL, PO   Oral   Take 1 tablet by mouth daily.           Allergies Ascriptin a-d; Codeine; Hydrocodone; Statins; and Tramadol  Family History  Problem Relation Age of Onset  . Cancer Mother   . Cancer Sister     Social History History  Substance Use Topics  . Smoking status: Never Smoker   . Smokeless tobacco: Never Used  . Alcohol Use: No    Review of Systems Constitutional: No fever/chills Eyes: No visual changes. ENT: No sore throat. Cardiovascular: Denies chest pain. Respiratory: Denies shortness of breath. Gastrointestinal:  No abdominal pain.  No nausea, no vomiting.  No diarrhea.  No constipation. Genitourinary: Negative for dysuria. Musculoskeletal: Positive for right lower extremity skin tear. Negative for back pain. Skin: Negative for rash. Neurological: Negative for headaches, focal weakness or numbness.  10-point ROS otherwise negative.  ____________________________________________   PHYSICAL EXAM:  VITAL SIGNS: ED Triage Vitals  Enc Vitals Group     BP 08/07/14 2337 187/73 mmHg     Pulse Rate 08/07/14 2337 65     Resp 08/07/14 2337 15     Temp 08/07/14 2337 97.6 F (36.4 C)     Temp Source 08/07/14 2337 Oral     SpO2 08/07/14 2337 100 %     Weight 08/07/14 2337 104 lb (47.174 kg)     Height 08/07/14 2337 5\' 3"  (1.6 m)     Head Cir --      Peak Flow --      Pain Score 08/07/14 2340 4     Pain Loc --      Pain Edu? --      Excl. in [place]? --     Constitutional: Alert and oriented. Well appearing and in no acute distress. Eyes: Conjunctivae are normal. PERRL. EOMI. Head: Atraumatic. Nose: No congestion/rhinnorhea. Mouth/Throat: Mucous membranes are moist.  Oropharynx non-erythematous. Neck: No stridor. No cervical spine tenderness to palpation. Cardiovascular: Normal rate, regular rhythm. Grossly normal heart sounds.  Good peripheral circulation. Respiratory: Normal respiratory effort.  No retractions. Lungs CTAB. Gastrointestinal: Soft and nontender. No distention. No abdominal bruits. No CVA tenderness. Musculoskeletal: Lateral aspect of right lower leg with skin tear. No deformities noted. 2+ distal pulses palpated. Neurologic:  Normal speech and language. No gross focal neurologic deficits are appreciated. Speech is normal.  Skin:  Skin is warm, dry and intact. No rash noted. Psychiatric: Mood and affect are normal. Speech and behavior are normal.  ____________________________________________   LABS (all labs ordered are listed, but only abnormal results are displayed)  Labs  Reviewed  URINALYSIS COMPLETEWITH MICROSCOPIC ([facility] ONLY) - Abnormal; Notable for the following:    Color, Urine STRAW (*)    APPearance CLEAR (*)    Glucose, UA 50 (*)    Hgb urine dipstick 1+ (*)    pH 9.0 (*)    All other components within normal limits  CBC WITH DIFFERENTIAL/PLATELET - Abnormal; Notable for  the following:    Hemoglobin 10.2 (*)    HCT 31.9 (*)    MCV 78.1 (*)    MCH 25.1 (*)    RDW 15.5 (*)    Neutro Abs 6.9 (*)    All other components within normal limits  COMPREHENSIVE METABOLIC PANEL - Abnormal; Notable for the following:    Sodium 131 (*)    Chloride 91 (*)    Albumin 3.2 (*)    All other components within normal limits  GLUCOSE, CAPILLARY  TROPONIN I  CBG MONITORING, ED   ____________________________________________  EKG  ED ECG REPORT I, SUNG,JADE J, the attending physician, personally viewed and interpreted this ECG.   Date: 08/08/2014  EKG Time: 2337  Rate: 64  Rhythm: normal EKG, normal sinus rhythm  Axis: LAD  Intervals:none  ST&T Change: Nonspecific  ____________________________________________  RADIOLOGY  Chest 2 view (viewed by me, interpreted per Dr. Gerilyn Nestle): No evidence of active pulmonary disease. Stable chronic changes since previous study.  Right knee x-rays (viewed by me, interpreted by Dr. Gerilyn Nestle): No acute bony abnormalities. ____________________________________________   PROCEDURES  Procedure(s) performed: None  Critical Care performed: No  ____________________________________________   INITIAL IMPRESSION / ASSESSMENT AND PLAN / ED COURSE  Pertinent labs & imaging results that were available during my care of the patient were reviewed by me and considered in my medical decision making (see chart for details).  81 year old female s/p fall with right lower extremity skin tear. Wound will be dressed by nurse. Extensive discussion with patient and her family regarding safety concern of patient returning to  live independently. Will obtain screening lab work, urine and chest x-ray. If there is no medical necessity for hospital admission, patient will remain in the ED overnight for clinical social work consult in the morning.  ----------------------------------------- 3:06 AM on 08/08/2014 -----------------------------------------  Patient resting in no acute distress. I had updated family of patient's test results including stable hyponatremia and normalize troponin from prior admission. At this time we will place patient in a hospital bed for comfort while she awaits clinical social work consult in the morning.   ----------------------------------------- 7:13 AM on 08/08/2014 -----------------------------------------  Patient resting in NAD. Pending CSW and PT consults. Care transferred to Dr. Archie Balboa.  ____________________________________________   FINAL CLINICAL IMPRESSION(S) / ED DIAGNOSES  Final diagnoses:  Fall, subsequent encounter      Paulette Blanch, MD 08/08/14 [phone]

## 2014-08-08 NOTE — Discharge Instructions (Signed)
Please seek medical attention for any high fevers, chest pain, shortness of breath, change in behavior, persistent vomiting, bloody stool or any other new or concerning symptoms.  Fall Prevention and Home Safety Falls cause injuries and can affect all age groups. It is possible to prevent falls.  HOW TO PREVENT FALLS  Wear shoes with rubber soles that do not have an opening for your toes.  Keep the inside and outside of your house well lit.  Use night lights throughout your home.  Remove clutter from floors.  Clean up floor spills.  Remove throw rugs or fasten them to the floor with carpet tape.  Do not place electrical cords across pathways.  Put grab bars by your tub, shower, and toilet. Do not use towel bars as grab bars.  Put handrails on both sides of the stairway. Fix loose handrails.  Do not climb on stools or stepladders, if possible.  Do not wax your floors.  Repair uneven or unsafe sidewalks, walkways, or stairs.  Keep items you use a lot within reach.  Be aware of pets.  Keep emergency numbers next to the telephone.  Put smoke detectors in your home and near bedrooms. Ask your doctor what other things you can do to prevent falls. Document Released: 11/10/2008 Document Revised: 07/16/2011 Document Reviewed: 04/16/2011 [facility] Patient Information 2015 [place], [place]. This information is not intended to replace advice given to you by your health care provider. Make sure you discuss any questions you have with your health care provider.

## 2014-08-08 NOTE — ED Notes (Signed)
Pt has acceptance at [facility], called and [facility] advised that room that pt would be going to was not cleaned yet at this time and should be cleaned around 1400

## 2014-08-08 NOTE — Clinical Social Work Placement (Signed)
   CLINICAL SOCIAL WORK PLACEMENT  NOTE  Date:  08/08/2014  Patient Details  Name: MILISA KIMBELL MRN: [id number] Date of Birth: 06/09/1922  Clinical Social Work is seeking post-discharge placement for this patient at the [place] level of care (*CSW will initial, date and re-position this form in  chart as items are completed):  Yes   Patient/family provided with [facility] Work Department's list of facilities offering this level of care within the geographic area requested by the patient (or if unable, by the patient's family).  Yes   Patient/family informed of their freedom to choose among providers that offer the needed level of care, that participate in Medicare, Medicaid or managed care program needed by the patient, have an available bed and are willing to accept the patient.  Yes   Patient/family informed of [facility]'s ownership interest in [facility] and [facility], as well as of the fact that they are under no obligation to receive care at these facilities.  PASRR submitted to EDS on       PASRR number received on       Existing PASRR number confirmed on 08/08/14     FL2 transmitted to all facilities in geographic area requested by pt/family on 08/08/14     FL2 transmitted to all facilities within larger geographic area on       Patient informed that his/her managed care company has contracts with or will negotiate with certain facilities, including the following:        Yes   Patient/family informed of bed offers received.  Patient chooses bed at [facility]     Physician recommends and patient chooses bed at      Patient to be transferred to [facility] on 08/08/14.  Patient to be transferred to facility by EMS     Patient family notified on 08/08/14 of transfer.  Name of family member notified:  Oren Beckmann (daughter)     PHYSICIAN Please prepare priority discharge summary, including medications, Please  sign FL2     Additional Comment:  Patient and family agrees with the placement at [facility] for short-term SNF.  Patient initially preferred a private room but has agreed with the semi-private bed offer.  CSW spoke with Kathy(daughter) and she will go to the facility to sign admission paperwork.  Patient will be transferred by [facility].  Patient will be going to bed 214A and call report to [phone].    _______________________________________________ Edmund Hilda, LCSW 08/08/2014, 12:20 PM

## 2014-08-08 NOTE — ED Notes (Signed)
PT in room to consult with pt

## 2014-08-08 NOTE — Care Management Note (Signed)
Case Management Note  Patient Details  Name: Amanda Stark MRN: [id number] Date of Birth: 1922-03-20  Subjective/Objective:   Pt . Is a 81 year old with falls at home. Family have decided she needs a higher level of care. Awaiting CSW.                 Action/Plan:   Expected Discharge Date:                  Expected Discharge Plan:     In-House Referral:     Discharge planning Services     Post Acute Care Choice:    Choice offered to:     DME Arranged:    DME Agency:     HH Arranged:    [place] Agency:     Status of Service:     Medicare Important Message Given:    Date Medicare IM Given:    Medicare IM give by:    Date Additional Medicare IM Given:    Additional Medicare Important Message give by:     If discussed at [place] of Stay Meetings, dates discussed:    Additional Comments:  Beau Fanny, RN 08/08/2014, 10:06 AM

## 2014-08-08 NOTE — Progress Notes (Signed)
°   08/08/14 1200  Clinical Encounter Type  Visited With Patient (Pastoral Care and prayer provided.)  Visit Type Follow-up  Referral From Chaplain  Spiritual Encounters  Spiritual Needs Prayer

## 2014-08-08 NOTE — ED Provider Notes (Signed)
-----------------------------------------   3:13 PM on 08/08/2014 -----------------------------------------  Patient was seen by social work and placement was obtained. Patient being transported by EMS.  Nance Pear, MD 08/08/14 [phone]

## 2014-08-08 NOTE — ED Notes (Signed)
Pt. BS of 81.

## 2014-08-08 NOTE — Evaluation (Signed)
Physical Therapy Evaluation Patient Details Name: Amanda Stark MRN: [id number] DOB: 1922/05/08 Today's Date: 08/08/2014   History of Present Illness  Pt is a 81 y.o. female s/p fall at home (feet slipped out from under her trying to use walker to ambulate to restroom) sustaining R LE skin tear and seen in ED for PT evaluation.  Knee imaging on R negative for acute bony abnormality 08/08/14.  Pt with recent admission d/t fall 08/02/14 and found to have  SVT with elevated troponin.  Pt reports 3 falls in past 2 weeks.  Clinical Impression  Currently pt demonstrates impairments with balance, gait mechanics, and limitations with functional mobility.  Prior to admission, pt was independent using rollator.  Pt and pt's family report 3 falls in past 2 weeks.  Pt lives alone in independent living section of her facility (family is working on moving pt to assistive living section of a facility).  Currently pt is min assist with ambulation d/t balance and gait mechanic impairments.  Pt would benefit from skilled PT to address above noted impairments and functional limitations.  Recommend pt discharge to STR when medically appropriate (pt and pt's family in agreement).     Follow Up Recommendations SNF    Equipment Recommendations  Rolling walker with 5" wheels    Recommendations for Other Services       Precautions / Restrictions Precautions Precautions: Fall Restrictions Weight Bearing Restrictions: No      Mobility  Bed Mobility Overal bed mobility: Independent (Supine to sit)                Transfers Overall transfer level: Needs assistance Equipment used: Rolling walker (2 wheeled) Transfers: Sit to/from Stand Sit to Stand: Min guard         General transfer comment: vc's required for safety with transfers  Ambulation/Gait Ambulation/Gait assistance: Min guard;Min assist Ambulation Distance (Feet): 100 Feet Assistive device: Rolling walker (2 wheeled)   Gait velocity:  decreased   General Gait Details: Pt with occasional loss of balance requiring assist to prevent falls; mild scisorring gait pattern noted intermittently; occasional path veering noted (mostly to L); altered stepping pattern (sometimes taking long steps and other times shorter steps)  Stairs            Wheelchair Mobility    Modified Rankin (Stroke Patients Only)       Balance Overall balance assessment: Needs assistance Sitting-balance support: No upper extremity supported Sitting balance-[name]: Good     Standing balance support: Bilateral upper extremity supported Standing balance-[name]: Fair                               Pertinent Vitals/Pain Pain Assessment: 0-10 Pain Score: 6  Pain Location: R knee Pain Descriptors / Indicators: Sore;Tender Pain Intervention(s): Limited activity within patient's tolerance;Monitored during session  Pt's HR 80-88bpm during session. Sitting on edge of bed pt's BP 181/86 and standing 171/91 (pt c/o feeling "woozy" standing but went away)    Home Living Family/patient expects to be discharged to:: Skilled nursing facility Living Arrangements: Alone   Type of Home: Independent living facility Home Access: Level entry     Home Layout: One level Home Equipment: Walker - 4 wheels (3 ww) Additional Comments: Pt lives in [place] section of Independent living.    Prior Function Level of Independence: Independent with assistive device(s)         Comments: Pt uses 4ww  Hand Dominance        Extremity/Trunk Assessment   Upper Extremity Assessment: Generalized weakness           Lower Extremity Assessment: Generalized weakness         Communication   Communication: No difficulties  Cognition Arousal/Alertness: Awake/alert Behavior During Therapy: WFL for tasks assessed/performed Overall Cognitive Status:  (pt oriented x4 but pt tangential at times and did not clearly answer questions  (pt's family assisted))       Memory: Decreased recall of precautions              General Comments  [name] and 2 sons present for beginning of session.  Nursing cleared pt for participation in PT.  Pt agreeable to PT session.    Exercises        Assessment/Plan    PT Assessment Patient needs continued PT services  PT Diagnosis Difficulty walking;Abnormality of gait   PT Problem List Decreased strength;Decreased balance;Decreased mobility;Decreased safety awareness;Pain  PT Treatment Interventions DME instruction;Gait training;Functional mobility training;Therapeutic activities;Therapeutic exercise;Balance training;Patient/family education   PT Goals (Current goals can be found in the Care Plan section) Acute Rehab PT Goals Patient Stated Goal: To not fall any more PT Goal Formulation: With patient Time For Goal Achievement: 08/22/14 Potential to Achieve Goals: Good    Frequency Min 2X/week   Barriers to discharge Decreased caregiver support      Co-evaluation               End of Session Equipment Utilized During Treatment: Gait belt Activity Tolerance: Patient tolerated treatment well (except did c/o 6/10 R knee pain) Patient left: in bed;with call bell/phone within reach;with bed alarm set Nurse Communication: Mobility status; STR recommendation    Functional Assessment Tool Used: AM-PAC without stairs Functional Limitation: Mobility: Walking and moving around Mobility: Walking and Moving Around Current Status (J6811): At least 40 percent but less than 60 percent impaired, limited or restricted Mobility: Walking and Moving Around Goal Status [phone]): At least 1 percent but less than 20 percent impaired, limited or restricted    Time: 0905-0930 PT Time Calculation (min) (ACUTE ONLY): 25 min   Charges:   PT Evaluation $Initial PT Evaluation Tier I: 1 Procedure     PT G Codes:   PT G-Codes **NOT FOR INPATIENT CLASS** Functional Assessment  Tool Used: AM-PAC without stairs Functional Limitation: Mobility: Walking and moving around Mobility: Walking and Moving Around Current Status (M3559): At least 40 percent but less than 60 percent impaired, limited or restricted Mobility: Walking and Moving Around Goal Status [phone]): At least 1 percent but less than 20 percent impaired, limited or restricted    Leitha Bleak 08/08/2014, 10:58 AM Leitha Bleak, [place]

## 2014-08-11 DIAGNOSIS — I471 Supraventricular tachycardia: Secondary | ICD-10-CM | POA: Diagnosis not present

## 2014-08-11 DIAGNOSIS — I10 Essential (primary) hypertension: Secondary | ICD-10-CM | POA: Diagnosis not present

## 2014-08-11 DIAGNOSIS — E119 Type 2 diabetes mellitus without complications: Secondary | ICD-10-CM | POA: Diagnosis not present

## 2014-08-11 DIAGNOSIS — R410 Disorientation, unspecified: Secondary | ICD-10-CM | POA: Diagnosis not present

## 2014-08-11 LAB — SEDIMENTATION RATE: SED RATE: 58 mm/h — AB (ref 0–30)

## 2014-08-11 LAB — TSH: TSH: 29.568 u[IU]/mL — AB (ref 0.350–4.500)

## 2014-08-11 LAB — VITAMIN B12: Vitamin B-12: 546 pg/mL (ref 180–914)

## 2014-08-14 ENCOUNTER — Other Ambulatory Visit
Admission: RE | Admit: 2014-08-14 | Discharge: 2014-08-14 | Disposition: A | Discharge status: Home / Self Care | Payer: Medicare Other | Source: Ambulatory Visit | Attending: Internal Medicine | Admitting: Internal Medicine

## 2014-08-14 DIAGNOSIS — R410 Disorientation, unspecified: Secondary | ICD-10-CM | POA: Diagnosis present

## 2014-08-14 LAB — URINALYSIS COMPLETE WITH MICROSCOPIC (ARMC ONLY)
Bacteria, UA: NONE SEEN
Bilirubin Urine: NEGATIVE
Glucose, UA: 50 mg/dL — AB
Hgb urine dipstick: NEGATIVE
Ketones, ur: NEGATIVE mg/dL
Leukocytes, UA: NEGATIVE
Nitrite: NEGATIVE
Protein, ur: NEGATIVE mg/dL
Specific Gravity, Urine: 1.01 (ref 1.005–1.030)
Squamous Epithelial / HPF: NONE SEEN
pH: 7 (ref 5.0–8.0)

## 2014-08-16 LAB — URINE CULTURE: Culture: 3000

## 2014-08-20 ENCOUNTER — Other Ambulatory Visit
Admission: RE | Admit: 2014-08-20 | Discharge: 2014-08-20 | Disposition: A | Discharge status: Home / Self Care | Payer: Medicare Other | Source: Ambulatory Visit | Attending: Internal Medicine | Admitting: Internal Medicine

## 2014-08-20 DIAGNOSIS — R350 Frequency of micturition: Secondary | ICD-10-CM | POA: Insufficient documentation

## 2014-08-20 DIAGNOSIS — R300 Dysuria: Secondary | ICD-10-CM | POA: Insufficient documentation

## 2014-08-20 LAB — URINALYSIS COMPLETE WITH MICROSCOPIC (ARMC ONLY)
Bacteria, UA: NONE SEEN
Bilirubin Urine: NEGATIVE
Glucose, UA: NEGATIVE mg/dL
Ketones, ur: NEGATIVE mg/dL
Nitrite: NEGATIVE
PH: 7 (ref 5.0–8.0)
PROTEIN: 30 mg/dL — AB
SPECIFIC GRAVITY, URINE: 1.011 (ref 1.005–1.030)
Squamous Epithelial / LPF: NONE SEEN

## 2014-08-22 LAB — URINE CULTURE: Culture: 80000

## 2014-08-29 ENCOUNTER — Encounter
Admission: RE | Admit: 2014-08-29 | Discharge: 2014-08-29 | Disposition: A | Discharge status: Home / Self Care | Payer: Medicare Other | Source: Ambulatory Visit | Attending: Internal Medicine | Admitting: Internal Medicine

## 2014-09-14 ENCOUNTER — Other Ambulatory Visit: Payer: Self-pay

## 2014-09-14 ENCOUNTER — Emergency Department: Payer: Medicare Other

## 2014-09-14 ENCOUNTER — Encounter: Payer: Self-pay | Admitting: Emergency Medicine

## 2014-09-14 ENCOUNTER — Emergency Department
Admission: EM | Admit: 2014-09-14 | Discharge: 2014-09-14 | Disposition: A | Discharge status: Home / Self Care | Payer: Medicare Other | Attending: Emergency Medicine | Admitting: Emergency Medicine

## 2014-09-14 DIAGNOSIS — Y9389 Activity, other specified: Secondary | ICD-10-CM | POA: Diagnosis not present

## 2014-09-14 DIAGNOSIS — I10 Essential (primary) hypertension: Secondary | ICD-10-CM | POA: Insufficient documentation

## 2014-09-14 DIAGNOSIS — S8010XA Contusion of unspecified lower leg, initial encounter: Secondary | ICD-10-CM | POA: Insufficient documentation

## 2014-09-14 DIAGNOSIS — R42 Dizziness and giddiness: Secondary | ICD-10-CM | POA: Insufficient documentation

## 2014-09-14 DIAGNOSIS — E119 Type 2 diabetes mellitus without complications: Secondary | ICD-10-CM | POA: Diagnosis not present

## 2014-09-14 DIAGNOSIS — S0990XA Unspecified injury of head, initial encounter: Secondary | ICD-10-CM | POA: Insufficient documentation

## 2014-09-14 DIAGNOSIS — S3992XA Unspecified injury of lower back, initial encounter: Secondary | ICD-10-CM | POA: Insufficient documentation

## 2014-09-14 DIAGNOSIS — W010XXA Fall on same level from slipping, tripping and stumbling without subsequent striking against object, initial encounter: Secondary | ICD-10-CM | POA: Diagnosis not present

## 2014-09-14 DIAGNOSIS — Y998 Other external cause status: Secondary | ICD-10-CM | POA: Insufficient documentation

## 2014-09-14 DIAGNOSIS — S0003XA Contusion of scalp, initial encounter: Secondary | ICD-10-CM | POA: Insufficient documentation

## 2014-09-14 DIAGNOSIS — Y92121 Bathroom in nursing home as the place of occurrence of the external cause: Secondary | ICD-10-CM | POA: Insufficient documentation

## 2014-09-14 HISTORY — DX: Dizziness and giddiness: R42

## 2014-09-14 LAB — CBC WITH DIFFERENTIAL/PLATELET
BASOS PCT: 1 %
Basophils Absolute: 0.1 10*3/uL (ref 0–0.1)
EOS ABS: 0.2 10*3/uL (ref 0–0.7)
EOS PCT: 1 %
HCT: 36.2 % (ref 35.0–47.0)
HEMOGLOBIN: 11.6 g/dL — AB (ref 12.0–16.0)
Lymphocytes Relative: 25 %
Lymphs Abs: 2.7 10*3/uL (ref 1.0–3.6)
MCH: 26 pg (ref 26.0–34.0)
MCHC: 32 g/dL (ref 32.0–36.0)
MCV: 81.2 fL (ref 80.0–100.0)
MONOS PCT: 8 %
Monocytes Absolute: 0.9 10*3/uL (ref 0.2–0.9)
NEUTROS PCT: 65 %
Neutro Abs: 7.3 10*3/uL — ABNORMAL HIGH (ref 1.4–6.5)
Platelets: 312 10*3/uL (ref 150–440)
RBC: 4.45 MIL/uL (ref 3.80–5.20)
RDW: 16.8 % — ABNORMAL HIGH (ref 11.5–14.5)
WBC: 11.1 10*3/uL — AB (ref 3.6–11.0)

## 2014-09-14 LAB — URINALYSIS COMPLETE WITH MICROSCOPIC (ARMC ONLY)
Bilirubin Urine: NEGATIVE
GLUCOSE, UA: 150 mg/dL — AB
Ketones, ur: NEGATIVE mg/dL
LEUKOCYTES UA: NEGATIVE
Nitrite: NEGATIVE
PH: 7 (ref 5.0–8.0)
Protein, ur: NEGATIVE mg/dL
Specific Gravity, Urine: 1.011 (ref 1.005–1.030)

## 2014-09-14 LAB — COMPREHENSIVE METABOLIC PANEL
ALBUMIN: 3.3 g/dL — AB (ref 3.5–5.0)
ALK PHOS: 76 U/L (ref 38–126)
ALT: 18 U/L (ref 14–54)
AST: 24 U/L (ref 15–41)
Anion gap: 7 (ref 5–15)
BUN: 22 mg/dL — ABNORMAL HIGH (ref 6–20)
CALCIUM: 9.2 mg/dL (ref 8.9–10.3)
CO2: 31 mmol/L (ref 22–32)
Chloride: 90 mmol/L — ABNORMAL LOW (ref 101–111)
Creatinine, Ser: 0.72 mg/dL (ref 0.44–1.00)
GFR calc non Af Amer: >60 mL/min (ref >60)
GLUCOSE: 137 mg/dL — AB (ref 65–99)
POTASSIUM: 3.6 mmol/L (ref 3.5–5.1)
SODIUM: 128 mmol/L — AB (ref 135–145)
Total Bilirubin: 0.6 mg/dL (ref 0.3–1.2)
Total Protein: 6.9 g/dL (ref 6.5–8.1)

## 2014-09-14 LAB — GLUCOSE, CAPILLARY: GLUCOSE-CAPILLARY: 389 mg/dL — AB (ref 65–99)

## 2014-09-14 LAB — TROPONIN I: TROPONIN I: 0.04 ng/mL — AB (ref <0.031)

## 2014-09-14 MED ORDER — MECLIZINE HCL 25 MG PO TABS: 25.0000 mg | ORAL_TABLET | Freq: Three times a day (TID) | ORAL | Status: DC | PRN

## 2014-09-14 MED ORDER — MECLIZINE HCL 25 MG PO TABS
25.0000 mg | ORAL_TABLET | Freq: Once | ORAL | Status: AC
  Administered 2014-09-14: 25 mg via ORAL
  Filled 2014-09-14: qty 1

## 2014-09-14 NOTE — ED Provider Notes (Signed)
[facility] Emergency Department Provider Note     Time seen: ----------------------------------------- 9:06 AM on 09/14/2014 -----------------------------------------    I have reviewed the triage vital signs and the nursing notes.   HISTORY  Chief Complaint Fall; Headache; and Back Pain    HPI Amanda Stark is a 81 y.o. female brought to the ER for fall. According to reports she's had recent fall. Patient denies complaints of the back pain currently and soreness to her scalp. Patient unwitnessed fall at the nursing home, no clear etiology was identified as to the reason for the fall.   Past Medical History  Diagnosis Date  . SVT (supraventricular tachycardia)   . Thyroid disease   . Instability of right shoulder joint   . Glenohumeral arthritis   . Diabetes mellitus without complication   . Hypertension   . Hyperlipidemia   . PMR (polymyalgia rheumatica)   . Lumbar spinal stenosis   . Osteoporosis   . Thyroid cancer   . Esophageal cancer     Patient Active Problem List   Diagnosis Date Noted  . SVT (supraventricular tachycardia) 08/02/2014  . Hyponatremia 08/02/2014    Past Surgical History  Procedure Laterality Date  . Total thyroidectomy    . Kidney stone removal    . Right lens implant    . Appendectomy    . Abdominal hysterectomy    . Thyroidectomy    . Back surgery      Allergies Ascriptin a-d; Codeine; Hydrocodone; Statins; and Tramadol  Social History Social History  Substance Use Topics  . Smoking status: Never Smoker   . Smokeless tobacco: Never Used  . Alcohol Use: No    Review of Systems Constitutional: Negative for fever. Eyes: Negative for visual changes. ENT: Negative for sore throat. Cardiovascular: Negative for chest pain. Respiratory: Negative for shortness of breath. Gastrointestinal: Negative for abdominal pain, vomiting and diarrhea. Genitourinary: Negative for dysuria. Musculoskeletal:  Positive for low back pain Skin: Negative for rash. Neurological: Negative for headaches, positive for weakness  10-point ROS otherwise negative.  ____________________________________________   PHYSICAL EXAM:  VITAL SIGNS: ED Triage Vitals  Enc Vitals Group     BP --      Pulse --      Resp --      Temp --      Temp src --      SpO2 --      Weight --      Height --      Head Cir --      Peak Flow --      Pain Score --      Pain Loc --      Pain Edu? --      Excl. in [place]? --     Constitutional: Alert and oriented. Well appearing and in no distress. Eyes: Conjunctivae are normal. PERRL. Normal extraocular movements. ENT   Head: Posterior scalp hematoma   Nose: No congestion/rhinnorhea.   Mouth/Throat: Mucous membranes are moist.   Neck: No stridor. Cardiovascular: Normal rate, regular rhythm. Normal and symmetric distal pulses are present in all extremities.  Respiratory: Normal respiratory effort without tachypnea nor retractions. Breath sounds are clear and equal bilaterally. No wheezes/rales/rhonchi. Gastrointestinal: Soft and nontender. No distention. No abdominal bruits.  Musculoskeletal: Nontender with normal range of motion in all extremities. No joint effusions.  No lower extremity tenderness nor edema. Neurologic:  Normal speech and language. No gross focal neurologic deficits are appreciated. Speech is normal. Patient  is weak but was able to stand with assistance Skin:  Scattered bruising on her extremities Psychiatric: Mood and affect are normal. Speech and behavior are normal. Patient exhibits appropriate insight and judgment. ____________________________________________  EKG: Interpreted by me. Normal sinus rhythm with left axis deviation, rate is 74 bpm, first-degree AV block, septal infarct.  ____________________________________________  ED COURSE:  Pertinent labs & imaging results that were available during my care of the patient were  reviewed by me and considered in my medical decision making (see chart for details). Patient with frequent falls, will check basic labs and imaging. ____________________________________________    LABS (pertinent positives/negatives)  Labs Reviewed  CBC WITH DIFFERENTIAL/PLATELET - Abnormal; Notable for the following:    WBC 11.1 (*)    Hemoglobin 11.6 (*)    RDW 16.8 (*)    Neutro Abs 7.3 (*)    All other components within normal limits  COMPREHENSIVE METABOLIC PANEL - Abnormal; Notable for the following:    Sodium 128 (*)    Chloride 90 (*)    Glucose, Bld 137 (*)    BUN 22 (*)    Albumin 3.3 (*)    All other components within normal limits  TROPONIN I - Abnormal; Notable for the following:    Troponin I 0.04 (*)    All other components within normal limits  URINALYSIS COMPLETEWITH MICROSCOPIC ([facility] ONLY) - Abnormal; Notable for the following:    Color, Urine YELLOW (*)    APPearance CLEAR (*)    Glucose, UA 150 (*)    Hgb urine dipstick 1+ (*)    Bacteria, UA RARE (*)    Squamous Epithelial / LPF 0-5 (*)    All other components within normal limits    RADIOLOGY Images were viewed by me  CT head, lumbosacral spine x-ray IMPRESSION: No acute intracranial abnormality.  Atrophy, chronic microvascular disease.   IMPRESSION: Severe scoliosis and spondylosis. No acute bony abnormality. ____________________________________________  FINAL ASSESSMENT AND PLAN  Fall, minor head injury, low back pain  Plan: Patient with labs and imaging as dictated above. Patient has been complaining of some vertigo symptoms and room spinning sensation. She states this is the reason that she fell. Her scan is unremarkable. She's being started on some meclizine. She is stable for outpatient follow-up   Earleen Newport, MD   Earleen Newport, MD 09/14/14 1048

## 2014-09-14 NOTE — Discharge Instructions (Signed)
Head Injury °You have received a head injury. It does not appear serious at this time. Headaches and vomiting are common following head injury. It should be easy to awaken from sleeping. Sometimes it is necessary for you to stay in the emergency department for a while for observation. Sometimes admission to the hospital may be needed. After injuries such as yours, most problems occur within the first 24 hours, but side effects may occur up to 7-10 days after the injury. It is important for you to carefully monitor your condition and contact your health care provider or seek immediate medical care if there is a change in your condition. °WHAT ARE THE TYPES OF HEAD INJURIES? °Head injuries can be as minor as a bump. Some head injuries can be more severe. More severe head injuries include: °· A jarring injury to the brain (concussion). °· A bruise of the brain (contusion). This mean there is bleeding in the brain that can cause swelling. °· A cracked skull (skull fracture). °· Bleeding in the brain that collects, clots, and forms a bump (hematoma). °WHAT CAUSES A HEAD INJURY? °A serious head injury is most likely to happen to someone who is in a car wreck and is not wearing a seat belt. Other causes of major head injuries include bicycle or motorcycle accidents, sports injuries, and falls. °HOW ARE HEAD INJURIES DIAGNOSED? °A complete history of the event leading to the injury and your current symptoms will be helpful in diagnosing head injuries. Many times, pictures of the brain, such as CT or MRI are needed to see the extent of the injury. Often, an overnight hospital stay is necessary for observation.  °WHEN SHOULD I SEEK IMMEDIATE MEDICAL CARE?  °You should get help right away if: °· You have confusion or drowsiness. °· You feel sick to your stomach (nauseous) or have continued, forceful vomiting. °· You have dizziness or unsteadiness that is getting worse. °· You have severe, continued headaches not relieved by  medicine. Only take over-the-counter or prescription medicines for pain, fever, or discomfort as directed by your health care provider. °· You do not have normal function of the arms or legs or are unable to walk. °· You notice changes in the black spots in the center of the colored part of your eye (pupil). °· You have a clear or bloody fluid coming from your nose or ears. °· You have a loss of vision. °During the next 24 hours after the injury, you must stay with someone who can watch you for the warning signs. This person should contact local emergency services (911 in the [place] if you have seizures, you become unconscious, or you are unable to wake up. °HOW CAN I PREVENT A HEAD INJURY IN THE FUTURE? °The most important factor for preventing major head injuries is avoiding motor vehicle accidents.  To minimize the potential for damage to your head, it is crucial to wear seat belts while riding in motor vehicles. Wearing helmets while bike riding and playing collision sports (like football) is also helpful. Also, avoiding dangerous activities around the house will further help reduce your risk of head injury.  °WHEN CAN I RETURN TO NORMAL ACTIVITIES AND ATHLETICS? °You should be reevaluated by your health care provider before returning to these activities. If you have any of the following symptoms, you should not return to activities or contact sports until 1 week after the symptoms have stopped: °· Persistent headache. °· Dizziness or vertigo. °· Poor attention and concentration. °· Confusion. °·   Memory problems.  Nausea or vomiting.  Fatigue or tire easily.  Irritability.  Intolerant of bright lights or loud noises.  Anxiety or depression.  Disturbed sleep. MAKE SURE YOU:   Understand these instructions.  Will watch your condition.  Will get help right away if you are not doing well or get worse. Document Released: 01/14/2005 Document Revised: 01/19/2013 Document Reviewed:  09/21/2012 [facility] Patient Information 2015 [place], [place]. This information is not intended to replace advice given to you by your health care provider. Make sure you discuss any questions you have with your health care provider.   Vertigo Vertigo means you feel like you or your surroundings are moving when they are not. Vertigo can be dangerous if it occurs when you are at work, driving, or performing difficult activities.  CAUSES  Vertigo occurs when there is a conflict of signals sent to your brain from the visual and sensory systems in your body. There are many different causes of vertigo, including:  Infections, especially in the inner ear.  A bad reaction to a drug or misuse of alcohol and medicines.  Withdrawal from drugs or alcohol.  Rapidly changing positions, such as lying down or rolling over in bed.  A migraine headache.  Decreased blood flow to the brain.  Increased pressure in the brain from a head injury, infection, tumor, or bleeding. SYMPTOMS  You may feel as though the world is spinning around or you are falling to the ground. Because your balance is upset, vertigo can cause nausea and vomiting. You may have involuntary eye movements (nystagmus). DIAGNOSIS  Vertigo is usually diagnosed by physical exam. If the cause of your vertigo is unknown, your caregiver may perform imaging tests, such as an MRI scan (magnetic resonance imaging). TREATMENT  Most cases of vertigo resolve on their own, without treatment. Depending on the cause, your caregiver may prescribe certain medicines. If your vertigo is related to body position issues, your caregiver may recommend movements or procedures to correct the problem. In rare cases, if your vertigo is caused by certain inner ear problems, you may need surgery. HOME CARE INSTRUCTIONS   Follow your caregiver's instructions.  Avoid driving.  Avoid operating heavy machinery.  Avoid performing any tasks that would be dangerous to  you or others during a vertigo episode.  Tell your caregiver if you notice that certain medicines seem to be causing your vertigo. Some of the medicines used to treat vertigo episodes can actually make them worse in some people. SEEK IMMEDIATE MEDICAL CARE IF:   Your medicines do not relieve your vertigo or are making it worse.  You develop problems with talking, walking, weakness, or using your arms, hands, or legs.  You develop severe headaches.  Your nausea or vomiting continues or gets worse.  You develop visual changes.  A family member notices behavioral changes.  Your condition gets worse. MAKE SURE YOU:  Understand these instructions.  Will watch your condition.  Will get help right away if you are not doing well or get worse. Document Released: 10/24/2004 Document Revised: 04/08/2011 Document Reviewed: 08/02/2010 [facility] Patient Information 2015 [place], [place]. This information is not intended to replace advice given to you by your health care provider. Make sure you discuss any questions you have with your health care provider.

## 2014-09-14 NOTE — ED Notes (Signed)
Patient arrives via [facility] from [facility] with c/o unwitnessed fall. Patient states she was in the restroom urinating when she had an acute onset of vertigo resulting in a fall injuring her lower back and striking her posterior right head; Hematoma noted. Hx of Vertigo confirmed with patient. EMS reports [facility] resulted a CBG of 121 and subsequently gave 10 Units of insulin prior to departure from their facility.

## 2014-10-07 ENCOUNTER — Encounter: Payer: Self-pay | Admitting: Emergency Medicine

## 2014-10-07 ENCOUNTER — Emergency Department
Admission: EM | Admit: 2014-10-07 | Discharge: 2014-10-07 | Disposition: A | Discharge status: Home / Self Care | Payer: Medicare Other | Attending: Student | Admitting: Student

## 2014-10-07 ENCOUNTER — Emergency Department: Payer: Medicare Other

## 2014-10-07 DIAGNOSIS — Z7952 Long term (current) use of systemic steroids: Secondary | ICD-10-CM | POA: Diagnosis not present

## 2014-10-07 DIAGNOSIS — Z79899 Other long term (current) drug therapy: Secondary | ICD-10-CM | POA: Diagnosis not present

## 2014-10-07 DIAGNOSIS — I471 Supraventricular tachycardia: Secondary | ICD-10-CM | POA: Insufficient documentation

## 2014-10-07 DIAGNOSIS — Z792 Long term (current) use of antibiotics: Secondary | ICD-10-CM | POA: Insufficient documentation

## 2014-10-07 DIAGNOSIS — Z794 Long term (current) use of insulin: Secondary | ICD-10-CM | POA: Diagnosis not present

## 2014-10-07 DIAGNOSIS — R739 Hyperglycemia, unspecified: Secondary | ICD-10-CM

## 2014-10-07 DIAGNOSIS — R000 Tachycardia, unspecified: Secondary | ICD-10-CM | POA: Diagnosis present

## 2014-10-07 DIAGNOSIS — I10 Essential (primary) hypertension: Secondary | ICD-10-CM | POA: Diagnosis not present

## 2014-10-07 DIAGNOSIS — E1165 Type 2 diabetes mellitus with hyperglycemia: Secondary | ICD-10-CM | POA: Diagnosis not present

## 2014-10-07 LAB — COMPREHENSIVE METABOLIC PANEL
ALBUMIN: 3.3 g/dL — AB (ref 3.5–5.0)
ALK PHOS: 85 U/L (ref 38–126)
ALT: 18 U/L (ref 14–54)
ANION GAP: 9 (ref 5–15)
AST: 30 U/L (ref 15–41)
BUN: 27 mg/dL — ABNORMAL HIGH (ref 6–20)
CALCIUM: 9.3 mg/dL (ref 8.9–10.3)
CO2: 29 mmol/L (ref 22–32)
Chloride: 91 mmol/L — ABNORMAL LOW (ref 101–111)
Creatinine, Ser: 0.88 mg/dL (ref 0.44–1.00)
GFR calc non Af Amer: 56 mL/min — ABNORMAL LOW (ref >60)
GLUCOSE: 400 mg/dL — AB (ref 65–99)
POTASSIUM: 5 mmol/L (ref 3.5–5.1)
SODIUM: 129 mmol/L — AB (ref 135–145)
TOTAL PROTEIN: 6.7 g/dL (ref 6.5–8.1)
Total Bilirubin: 0.6 mg/dL (ref 0.3–1.2)

## 2014-10-07 LAB — URINALYSIS COMPLETE WITH MICROSCOPIC (ARMC ONLY)
BILIRUBIN URINE: NEGATIVE
Ketones, ur: NEGATIVE mg/dL
LEUKOCYTES UA: NEGATIVE
NITRITE: NEGATIVE
Protein, ur: NEGATIVE mg/dL
Specific Gravity, Urine: 1.01 (ref 1.005–1.030)
pH: 7 (ref 5.0–8.0)

## 2014-10-07 LAB — CBC WITH DIFFERENTIAL/PLATELET
BASOS PCT: 0 %
Basophils Absolute: 0 10*3/uL (ref 0–0.1)
EOS ABS: 0 10*3/uL (ref 0–0.7)
EOS PCT: 0 %
HCT: 35.2 % (ref 35.0–47.0)
Hemoglobin: 11.2 g/dL — ABNORMAL LOW (ref 12.0–16.0)
LYMPHS ABS: 1.3 10*3/uL (ref 1.0–3.6)
Lymphocytes Relative: 8 %
MCH: 26.4 pg (ref 26.0–34.0)
MCHC: 31.9 g/dL — AB (ref 32.0–36.0)
MCV: 82.8 fL (ref 80.0–100.0)
MONO ABS: 0.6 10*3/uL (ref 0.2–0.9)
MONOS PCT: 4 %
NEUTROS PCT: 88 %
Neutro Abs: 13.9 10*3/uL — ABNORMAL HIGH (ref 1.4–6.5)
Platelets: 348 10*3/uL (ref 150–440)
RBC: 4.25 MIL/uL (ref 3.80–5.20)
RDW: 14.9 % — AB (ref 11.5–14.5)
WBC: 15.9 10*3/uL — ABNORMAL HIGH (ref 3.6–11.0)

## 2014-10-07 LAB — T4, FREE: FREE T4: 1.15 ng/dL — AB (ref 0.61–1.12)

## 2014-10-07 LAB — TROPONIN I: Troponin I: <0.03 ng/mL (ref <0.031)

## 2014-10-07 LAB — TSH: TSH: 5.428 u[IU]/mL — AB (ref 0.350–4.500)

## 2014-10-07 LAB — PROTIME-INR
INR: 0.97
PROTHROMBIN TIME: 13.1 s (ref 11.4–15.0)

## 2014-10-07 LAB — APTT: APTT: 27 s (ref 24–36)

## 2014-10-07 MED ORDER — ADENOSINE 6 MG/2ML IV SOLN
6.0000 mg | Freq: Once | INTRAVENOUS | Status: AC
  Administered 2014-10-07: 6 mg via INTRAVENOUS

## 2014-10-07 MED ORDER — SODIUM CHLORIDE 0.9 % IV BOLUS (SEPSIS)
500.0000 mL | Freq: Once | INTRAVENOUS | Status: AC
  Administered 2014-10-07: 500 mL via INTRAVENOUS

## 2014-10-07 MED ORDER — ADENOSINE 6 MG/2ML IV SOLN
INTRAVENOUS | Status: AC
  Administered 2014-10-07: 6 mg via INTRAVENOUS
  Filled 2014-10-07: qty 2

## 2014-10-07 MED ORDER — ADENOSINE 12 MG/4ML IV SOLN
INTRAVENOUS | Status: AC
  Filled 2014-10-07: qty 4

## 2014-10-07 NOTE — ED Notes (Signed)
Pt on 2L [place], resting in bed, pt awake and alert, pt complaining of dry mouth

## 2014-10-07 NOTE — ED Notes (Signed)
Pt. Going home with family.  DNR given back to family to take back to facility.

## 2014-10-07 NOTE — ED Notes (Signed)
6 mg adenosine given with Dr. Edd Fabian at bedside, continuous EKG preformed, pt awake and alert, pt now 88 NSR

## 2014-10-07 NOTE — ED Notes (Signed)
[id number] 

## 2014-10-07 NOTE — ED Notes (Signed)
Pt placed on 2L [place], vaegeal maneuver attempted, MD at bedside

## 2014-10-07 NOTE — ED Provider Notes (Signed)
[facility] Emergency Department Provider Note  ____________________________________________  Time seen: Approximately 3:42 PM  I have reviewed the triage vital signs and the nursing notes.   HISTORY  Chief Complaint Irregular Heart Beat    HPI Amanda Stark is a 81 y.o. female with history of diabetes, thyroid disease, hypertension, hyperlipidemia history of SVT who presents for evaluation of elevated heart rate. Patient reports that her home health aide checked her heart rate today and it was elevated to the center to the emergency department. Currently she is asymptomatic other than complaining of dry mouth. She has no chest pain, no difficulty breathing. She denies any recent illness including no cough, sneezing, runny nose, congestion, vomiting, diarrhea, fevers or chills.She was seen here on 09/15/2014 for a fall with head injury but reports no recurrent falls since that time. Currently she has no symptoms. No modifying factors.   Past Medical History  Diagnosis Date  . SVT (supraventricular tachycardia)   . Thyroid disease   . Instability of right shoulder joint   . Glenohumeral arthritis   . Diabetes mellitus without complication   . Hypertension   . Hyperlipidemia   . PMR (polymyalgia rheumatica)   . Lumbar spinal stenosis   . Osteoporosis   . Thyroid cancer   . Esophageal cancer   . Vertigo     Patient Active Problem List   Diagnosis Date Noted  . SVT (supraventricular tachycardia) 08/02/2014  . Hyponatremia 08/02/2014    Past Surgical History  Procedure Laterality Date  . Total thyroidectomy    . Kidney stone removal    . Right lens implant    . Appendectomy    . Abdominal hysterectomy    . Thyroidectomy    . Back surgery      Current Outpatient Rx  Name  Route  Sig  Dispense  Refill  . acetaminophen (TYLENOL) 650 MG CR tablet   Oral   Take 650 mg by mouth every 4 (four) hours as needed for pain or fever.         .  Calcium Carb-Cholecalciferol (CALCIUM 600+D3 PO)   Oral   Take 1 tablet by mouth 2 (two) times daily.         . Cholecalciferol (VITAMIN D3) 5000 UNITS TABS   Oral   Take 5,000 Units by mouth daily.         [name] docusate sodium (COLACE) 100 MG capsule   Oral   Take 100 mg by mouth 2 (two) times daily.         . insulin detemir (LEVEMIR) 100 UNIT/ML injection   Subcutaneous   Inject 5-10 Units into the skin 2 (two) times daily. Pt uses 10 units in the morning and 5 units at bedtime.         . Iron-Vitamin C (VITRON-C) 65-125 MG TABS   Oral   Take 1 tablet by mouth daily at 12 noon.          [name] levothyroxine (SYNTHROID, LEVOTHROID) 125 MCG tablet   Oral   Take 125 mcg by mouth daily before breakfast.         . LORazepam (ATIVAN) 1 MG tablet   Oral   Take 1 mg by mouth at bedtime.         . meclizine (ANTIVERT) 25 MG tablet   Oral   Take 1 tablet (25 mg total) by mouth 3 (three) times daily as needed for dizziness or nausea.   30 tablet  1   . metoprolol (LOPRESSOR) 100 MG tablet   Oral   Take 100 mg by mouth 2 (two) times daily.         . Multiple Vitamin (MULTIVITAMIN WITH MINERALS) TABS tablet   Oral   Take 1 tablet by mouth daily.         [name] neomycin-bacitracin-polymyxin (NEOSPORIN) ointment   Topical   Apply 1 application topically daily. Pt applies to right lower leg.         [name] omeprazole (PRILOSEC) 20 MG capsule   Oral   Take 20 mg by mouth daily.         [name] oxyCODONE-acetaminophen (PERCOCET/ROXICET) 5-325 MG per tablet   Oral   Take 1 tablet by mouth 2 (two) times daily as needed for severe pain.         . polyethylene glycol (MIRALAX / GLYCOLAX) packet   Oral   Take 17 g by mouth daily.         . predniSONE (DELTASONE) 5 MG tablet   Oral   Take 7.5 mg by mouth daily.         [name] levothyroxine (SYNTHROID, LEVOTHROID) 100 MCG tablet   Oral   Take 1 tablet (100 mcg total) by mouth daily before breakfast. Patient not taking:  Reported on 09/14/2014   30 tablet   11     Allergies Ascriptin a-d; Aspirin; Codeine; Hydrocodone; Statins; and Tramadol  Family History  Problem Relation Age of Onset  . Cancer Mother   . Cancer Sister     Social History Social History  Substance Use Topics  . Smoking status: Never Smoker   . Smokeless tobacco: Never Used  . Alcohol Use: No    Review of Systems Constitutional: No fever/chills Eyes: No visual changes. ENT: No sore throat. Cardiovascular: Denies chest pain. Respiratory: Denies shortness of breath. Gastrointestinal: No abdominal pain.  No nausea, no vomiting.  No diarrhea.  No constipation. Genitourinary: Negative for dysuria. Musculoskeletal: Negative for back pain. Skin: Negative for rash. Neurological: Negative for headaches, focal weakness or numbness.  10-point ROS otherwise negative.  ____________________________________________   PHYSICAL EXAM:  VITAL SIGNS: ED Triage Vitals  Enc Vitals Group     BP 10/07/14 1510 137/84 mmHg     Pulse Rate 10/07/14 1510 140     Resp 10/07/14 1510 18     Temp 10/07/14 1510 98.9 F (37.2 C)     Temp Source 10/07/14 1510 Oral     SpO2 10/07/14 1510 98 %     Weight 10/07/14 1506 107 lb (48.535 kg)     Height 10/07/14 1506 5\' 2"  (1.575 m)     Head Cir --      Peak Flow --      Pain Score 10/07/14 1506 3     Pain Loc --      Pain Edu? --      Excl. in [place]? --     Constitutional: Alert and oriented. Well appearing and in no acute distress. Eyes: Conjunctivae are normal. PERRL. EOMI. Head: Atraumatic. Nose: No congestion/rhinnorhea. Mouth/Throat: Mucous membranes are moist.  Oropharynx non-erythematous. Neck: No stridor.  Cardiovascular: tachycardic rate, regular rhythm. Grossly normal heart sounds.  Good peripheral circulation. Respiratory: Normal respiratory effort.  No retractions. Lungs CTAB. Gastrointestinal: Soft and nontender. No distention. No abdominal bruits. No CVA  tenderness. Genitourinary: deferred Musculoskeletal: No lower extremity tenderness nor edema.  No joint effusions. Neurologic:  Normal speech and language. No gross focal neurologic deficits  are appreciated.  Skin:  Skin is warm, dry and intact. No rash noted. Psychiatric: Mood and affect are normal. Speech and behavior are normal.  ____________________________________________   LABS (all labs ordered are listed, but only abnormal results are displayed)  Labs Reviewed  CBC WITH DIFFERENTIAL/PLATELET - Abnormal; Notable for the following:    WBC 15.9 (*)    Hemoglobin 11.2 (*)    MCHC 31.9 (*)    RDW 14.9 (*)    Neutro Abs 13.9 (*)    All other components within normal limits  COMPREHENSIVE METABOLIC PANEL - Abnormal; Notable for the following:    Sodium 129 (*)    Chloride 91 (*)    Glucose, Bld 400 (*)    BUN 27 (*)    Albumin 3.3 (*)    GFR calc non Af Amer 56 (*)    All other components within normal limits  URINALYSIS COMPLETEWITH MICROSCOPIC ([facility] ONLY) - Abnormal; Notable for the following:    Color, Urine STRAW (*)    APPearance CLEAR (*)    Glucose, UA >500 (*)    Hgb urine dipstick 1+ (*)    Bacteria, UA RARE (*)    Squamous Epithelial / LPF 0-5 (*)    All other components within normal limits  TSH - Abnormal; Notable for the following:    TSH 5.428 (*)    All other components within normal limits  T4, FREE - Abnormal; Notable for the following:    Free T4 1.15 (*)    All other components within normal limits  URINE CULTURE  TROPONIN I  APTT  PROTIME-INR   ____________________________________________  EKG  ED ECG REPORT I, Joanne Gavel, the attending physician, personally viewed and interpreted this ECG.   Date: 10/07/2014  EKG Time: 15:18  Rate: 138  Rhythm: supraventricular tachycardia  Axis: normal  Intervals:left anterior fascicular block  ST&T Change: No STEMI  ED ECG REPORT I, Joanne Gavel, the attending physician, personally viewed  and interpreted this ECG.   Date: 10/07/2014  EKG Time: 15:39  Rate: 87  Rhythm: normal sinus rhythm with premature supraventricular complexes  Axis: Left  Intervals:none  ST&T Change: No acute ST segment elevation.   ____________________________________________  RADIOLOGY  CXR  IMPRESSION: Patchy densities in the right chest could represent a combination of volume loss and chronic changes, such as old rib fractures. Recommend follow-up examination to exclude airspace densities in this region.  Low lung volumes.  Hiatal hernia. ____________________________________________   PROCEDURES  Procedure(s) performed: None  Critical Care performed: Yes, see critical care note(s). Total critical care time spent 30 minutes.  ____________________________________________   INITIAL IMPRESSION / ASSESSMENT AND PLAN / ED COURSE  Pertinent labs & imaging results that were available during my care of the patient were reviewed by me and considered in my medical decision making (see chart for details).  Amanda Stark is a 81 y.o. female with history of diabetes, hypertension, hyperlipidemia history of SVT who presents for evaluation of elevated heart rate found to have stable SVT with heart rate in the low 130s to 140s on arrival which would not respond to vagal maneuvers. She converted to sinus rhythm with 6 mg grams of adenosine and is currently in sinus rhythm. Currently asymptomatic maintaining adequate blood pressure. She has had this before. Plan screening labs, chest x-ray, observation in the ER.  ----------------------------------------- 6:57 PM on 10/07/2014 -----------------------------------------  Patient to use to feel well. She has had no recurrence of superventricular tachycardia  since being in the department and she has been observed for nearly 4 hours. Troponin negative. Normal coags. CBC with notches of leukocytosis which may be: Induced in the setting of her SVT  today. Her urine appears chronically inflamed on chart review and she has no urinary complaints so we'll send cultures. She has mild anemia which is chronic and stable. She was noted to be hyponatremic with sodium of 129 however she also has hyperglycemia with glucose of 400 on arrival and this correct appropriately for her degree of hyperglycemia. Her glucose improved to 326 with IV fluids only, I encouraged her to take her diabetic medications when she gets home. Normal bicarb, no ketones, not comsistent with DKA.  T4 very minimally elevated  at 1.15 but she has history of thyroid disease. Additionally she has chronic hyponatremia which is stable on chart review. He does return precautions, need for close PCP follow-up in the patient and her family at bedside are comfortable with the discharge plan. DC home. ____________________________________________   FINAL CLINICAL IMPRESSION(S) / ED DIAGNOSES  Final diagnoses:  SVT (supraventricular tachycardia)  Hyperglycemia      Joanne Gavel, MD 10/07/14 [phone]

## 2014-10-07 NOTE — ED Notes (Signed)
Pt states resides at [facility] and her home health nurse came out to see her and told her that her heart beat was irregular, states she has been feeling "funny" in her chest for the last couple of days

## 2014-10-07 NOTE — ED Notes (Signed)
Called into patients room to discuss plan of care. Family told this RN that the patient had a fall 3 weeks ago. When she fell she hit the back of her head and chipped both her upper and lower dentures. Since the fall, family states the patient has been increasingly confused. Patient did have CT done and are now requesting x-rays. Family also states patient is just getting over a UTI that she was diagnosed with a week ago. Patient has finished full course of antibiotics. Dr. Edd Fabian notified.

## 2014-10-07 NOTE — ED Notes (Signed)
Pt states dizziness, pt on monitor, code cart at bedside, IV established, pt awake and alert

## 2014-10-08 LAB — GLUCOSE, CAPILLARY: Glucose-Capillary: 327 mg/dL — ABNORMAL HIGH (ref 65–99)

## 2014-10-09 LAB — URINE CULTURE

## 2014-11-20 ENCOUNTER — Encounter: Payer: Self-pay | Admitting: Emergency Medicine

## 2014-11-20 ENCOUNTER — Emergency Department: Payer: Medicare Other

## 2014-11-20 ENCOUNTER — Inpatient Hospital Stay
Admission: EM | Admit: 2014-11-20 | Discharge: 2014-11-23 | DRG: 310 | Disposition: A | Discharge status: Nursing Home (Includes ICF) | Payer: Medicare Other | Attending: Internal Medicine | Admitting: Internal Medicine

## 2014-11-20 DIAGNOSIS — Z8501 Personal history of malignant neoplasm of esophagus: Secondary | ICD-10-CM | POA: Diagnosis not present

## 2014-11-20 DIAGNOSIS — R000 Tachycardia, unspecified: Secondary | ICD-10-CM

## 2014-11-20 DIAGNOSIS — M353 Polymyalgia rheumatica: Secondary | ICD-10-CM | POA: Diagnosis present

## 2014-11-20 DIAGNOSIS — I480 Paroxysmal atrial fibrillation: Secondary | ICD-10-CM | POA: Diagnosis present

## 2014-11-20 DIAGNOSIS — E785 Hyperlipidemia, unspecified: Secondary | ICD-10-CM | POA: Diagnosis present

## 2014-11-20 DIAGNOSIS — Z794 Long term (current) use of insulin: Secondary | ICD-10-CM

## 2014-11-20 DIAGNOSIS — M4806 Spinal stenosis, lumbar region: Secondary | ICD-10-CM | POA: Diagnosis present

## 2014-11-20 DIAGNOSIS — F0390 Unspecified dementia without behavioral disturbance: Secondary | ICD-10-CM | POA: Diagnosis present

## 2014-11-20 DIAGNOSIS — M810 Age-related osteoporosis without current pathological fracture: Secondary | ICD-10-CM | POA: Diagnosis present

## 2014-11-20 DIAGNOSIS — I4891 Unspecified atrial fibrillation: Secondary | ICD-10-CM | POA: Diagnosis present

## 2014-11-20 DIAGNOSIS — E11649 Type 2 diabetes mellitus with hypoglycemia without coma: Secondary | ICD-10-CM | POA: Diagnosis present

## 2014-11-20 DIAGNOSIS — E039 Hypothyroidism, unspecified: Secondary | ICD-10-CM | POA: Diagnosis present

## 2014-11-20 DIAGNOSIS — R55 Syncope and collapse: Secondary | ICD-10-CM | POA: Diagnosis not present

## 2014-11-20 DIAGNOSIS — Z79899 Other long term (current) drug therapy: Secondary | ICD-10-CM

## 2014-11-20 DIAGNOSIS — Z85850 Personal history of malignant neoplasm of thyroid: Secondary | ICD-10-CM | POA: Diagnosis not present

## 2014-11-20 DIAGNOSIS — Z66 Do not resuscitate: Secondary | ICD-10-CM | POA: Diagnosis present

## 2014-11-20 DIAGNOSIS — K219 Gastro-esophageal reflux disease without esophagitis: Secondary | ICD-10-CM | POA: Diagnosis present

## 2014-11-20 DIAGNOSIS — I10 Essential (primary) hypertension: Secondary | ICD-10-CM | POA: Diagnosis present

## 2014-11-20 LAB — COMPREHENSIVE METABOLIC PANEL
ALK PHOS: 86 U/L (ref 38–126)
ALT: 18 U/L (ref 14–54)
AST: 23 U/L (ref 15–41)
Albumin: 3.3 g/dL — ABNORMAL LOW (ref 3.5–5.0)
Anion gap: 10 (ref 5–15)
BILIRUBIN TOTAL: 0.7 mg/dL (ref 0.3–1.2)
BUN: 30 mg/dL — AB (ref 6–20)
CALCIUM: 9.4 mg/dL (ref 8.9–10.3)
CO2: 29 mmol/L (ref 22–32)
CREATININE: 0.81 mg/dL (ref 0.44–1.00)
Chloride: 95 mmol/L — ABNORMAL LOW (ref 101–111)
GFR calc Af Amer: >60 mL/min (ref >60)
GFR calc non Af Amer: >60 mL/min (ref >60)
GLUCOSE: 207 mg/dL — AB (ref 65–99)
Potassium: 3.8 mmol/L (ref 3.5–5.1)
SODIUM: 134 mmol/L — AB (ref 135–145)
Total Protein: 7 g/dL (ref 6.5–8.1)

## 2014-11-20 LAB — URINALYSIS COMPLETE WITH MICROSCOPIC (ARMC ONLY)
BACTERIA UA: NONE SEEN
BILIRUBIN URINE: NEGATIVE
GLUCOSE, UA: 50 mg/dL — AB
LEUKOCYTES UA: NEGATIVE
NITRITE: NEGATIVE
PH: 7 (ref 5.0–8.0)
Protein, ur: 30 mg/dL — AB
SPECIFIC GRAVITY, URINE: 1.012 (ref 1.005–1.030)
SQUAMOUS EPITHELIAL / LPF: NONE SEEN

## 2014-11-20 LAB — PROTIME-INR
INR: 1.04
Prothrombin Time: 13.8 seconds (ref 11.4–15.0)

## 2014-11-20 LAB — T4, FREE: FREE T4: 1.53 ng/dL — AB (ref 0.61–1.12)

## 2014-11-20 LAB — CBC WITH DIFFERENTIAL/PLATELET
BASOS PCT: 1 %
Basophils Absolute: 0.1 10*3/uL (ref 0–0.1)
Eosinophils Absolute: 0.1 10*3/uL (ref 0–0.7)
Eosinophils Relative: 1 %
HEMATOCRIT: 37.6 % (ref 35.0–47.0)
HEMOGLOBIN: 12.6 g/dL (ref 12.0–16.0)
LYMPHS ABS: 2.5 10*3/uL (ref 1.0–3.6)
LYMPHS PCT: 20 %
MCH: 27.5 pg (ref 26.0–34.0)
MCHC: 33.4 g/dL (ref 32.0–36.0)
MCV: 82.2 fL (ref 80.0–100.0)
MONOS PCT: 12 %
Monocytes Absolute: 1.4 10*3/uL — ABNORMAL HIGH (ref 0.2–0.9)
NEUTROS ABS: 8.1 10*3/uL — AB (ref 1.4–6.5)
NEUTROS PCT: 66 %
Platelets: 379 10*3/uL (ref 150–440)
RBC: 4.57 MIL/uL (ref 3.80–5.20)
RDW: 13.3 % (ref 11.5–14.5)
WBC: 12.1 10*3/uL — ABNORMAL HIGH (ref 3.6–11.0)

## 2014-11-20 LAB — TSH: TSH: 7.616 u[IU]/mL — AB (ref 0.350–4.500)

## 2014-11-20 LAB — LIPASE, BLOOD: Lipase: 50 U/L (ref 11–51)

## 2014-11-20 LAB — LACTIC ACID, PLASMA
LACTIC ACID, VENOUS: 1.6 mmol/L (ref 0.5–2.0)
Lactic Acid, Venous: 1.2 mmol/L (ref 0.5–2.0)

## 2014-11-20 LAB — TROPONIN I: Troponin I: 0.05 ng/mL — ABNORMAL HIGH (ref <0.031)

## 2014-11-20 MED ORDER — DILTIAZEM HCL 25 MG/5ML IV SOLN
10.0000 mg | Freq: Once | INTRAVENOUS | Status: AC
  Administered 2014-11-20: 10 mg via INTRAVENOUS

## 2014-11-20 MED ORDER — ACETAMINOPHEN 650 MG RE SUPP: 650.0000 mg | Freq: Four times a day (QID) | RECTAL | Status: DC | PRN

## 2014-11-20 MED ORDER — ACETAMINOPHEN 325 MG PO TABS: 650.0000 mg | ORAL_TABLET | Freq: Four times a day (QID) | ORAL | Status: DC | PRN

## 2014-11-20 MED ORDER — ONDANSETRON HCL 4 MG/2ML IJ SOLN
4.0000 mg | Freq: Four times a day (QID) | INTRAMUSCULAR | Status: DC | PRN
  Administered 2014-11-20: 4 mg via INTRAVENOUS
  Filled 2014-11-20: qty 2

## 2014-11-20 MED ORDER — SODIUM CHLORIDE 0.9 % IJ SOLN
3.0000 mL | Freq: Two times a day (BID) | INTRAMUSCULAR | Status: DC
  Administered 2014-11-21 – 2014-11-22 (×4): 3 mL via INTRAVENOUS

## 2014-11-20 MED ORDER — DILTIAZEM HCL 25 MG/5ML IV SOLN
INTRAVENOUS | Status: AC
  Administered 2014-11-20: 10 mg via INTRAVENOUS
  Filled 2014-11-20: qty 5

## 2014-11-20 MED ORDER — SODIUM CHLORIDE 0.9 % IV BOLUS (SEPSIS)
1000.0000 mL | Freq: Once | INTRAVENOUS | Status: AC
  Administered 2014-11-20: 1000 mL via INTRAVENOUS

## 2014-11-20 MED ORDER — SODIUM CHLORIDE 0.9 % IV SOLN
INTRAVENOUS | Status: DC
  Administered 2014-11-21 (×2): via INTRAVENOUS

## 2014-11-20 MED ORDER — DILTIAZEM HCL 100 MG IV SOLR
5.0000 mg/h | INTRAVENOUS | Status: DC
  Administered 2014-11-20: 5 mg/h via INTRAVENOUS
  Filled 2014-11-20: qty 100

## 2014-11-20 MED ORDER — SODIUM CHLORIDE 0.9 % IV SOLN
Freq: Once | INTRAVENOUS | Status: AC
  Administered 2014-11-20: 23:00:00 via INTRAVENOUS

## 2014-11-20 MED ORDER — DILTIAZEM HCL 25 MG/5ML IV SOLN
10.0000 mg | Freq: Once | INTRAVENOUS | Status: AC
  Administered 2014-11-20: 10 mg via INTRAVENOUS
  Filled 2014-11-20: qty 5

## 2014-11-20 MED ORDER — DILTIAZEM LOAD VIA INFUSION
10.0000 mg | Freq: Once | INTRAVENOUS | Status: DC
  Filled 2014-11-20: qty 10

## 2014-11-20 MED ORDER — ONDANSETRON HCL 4 MG PO TABS: 4.0000 mg | ORAL_TABLET | Freq: Four times a day (QID) | ORAL | Status: DC | PRN

## 2014-11-20 MED ORDER — HEPARIN SODIUM (PORCINE) 5000 UNIT/ML IJ SOLN
5000.0000 [IU] | Freq: Three times a day (TID) | INTRAMUSCULAR | Status: DC
Start: 2014-11-20 — End: 2014-11-22
  Administered 2014-11-21 – 2014-11-22 (×5): 5000 [IU] via SUBCUTANEOUS
  Filled 2014-11-20 (×5): qty 1

## 2014-11-20 MED ORDER — ADENOSINE 6 MG/2ML IV SOLN
INTRAVENOUS | Status: AC
Start: 2014-11-20 — End: 2014-11-21
  Filled 2014-11-20: qty 2

## 2014-11-20 MED ORDER — HYDRALAZINE HCL 20 MG/ML IJ SOLN: 10.0000 mg | INTRAMUSCULAR | Status: DC | PRN

## 2014-11-20 NOTE — ED Provider Notes (Addendum)
[facility] Emergency Department Provider Note  ____________________________________________   I have reviewed the triage vital signs and the nursing notes.   HISTORY  Chief Complaint Nausea; Emesis; Diarrhea; and Loss of Consciousness  history is limited by patient baseline mental status, I did call the nursing home as well as talk to EMS   HPI Amanda Stark is a 81 y.o. female history of of diabetes, thyroid disease, hypertension, hyperlipidemia history on the dementia, as well as in the chart it appears SVT. Patient is ondiltiazem, 3 times a day but did not have his diltiazem dose at 2:00 this afternoon as is her walk. According to nursing home staff patient has been increasingly weak during the course of the day. She had diarrhea, nonbloody a few times, did not vomit. The past for "a few seconds" without hitting her head. Patient herself cannot give a history as she has a history of dementia. She is able to tell me her name and where she is. No documented fevers.  Past Medical History  Diagnosis Date  . SVT (supraventricular tachycardia) ([place])   . Thyroid disease   . Instability of right shoulder joint   . Glenohumeral arthritis   . Diabetes mellitus without complication ([place])   . Hypertension   . Hyperlipidemia   . PMR (polymyalgia rheumatica) ([facility])   . Lumbar spinal stenosis   . Osteoporosis   . Thyroid cancer ([place])   . Esophageal cancer ([place])   . Vertigo     Patient Active Problem List   Diagnosis Date Noted  . SVT (supraventricular tachycardia) ([place]) 08/02/2014  . Hyponatremia 08/02/2014    Past Surgical History  Procedure Laterality Date  . Total thyroidectomy    . Kidney stone removal    . Right lens implant    . Appendectomy    . Abdominal hysterectomy    . Thyroidectomy    . Back surgery      Current Outpatient Rx  Name  Route  Sig  Dispense  Refill  . acetaminophen (TYLENOL) 650 MG CR tablet   Oral   Take 650 mg by mouth  every 4 (four) hours as needed for pain or fever.         . Calcium Carb-Cholecalciferol (CALCIUM 600+D3 PO)   Oral   Take 1 tablet by mouth 2 (two) times daily.         . Cholecalciferol (VITAMIN D3) 5000 UNITS TABS   Oral   Take 5,000 Units by mouth daily.         [name] docusate sodium (COLACE) 100 MG capsule   Oral   Take 100 mg by mouth 2 (two) times daily.         . insulin detemir (LEVEMIR) 100 UNIT/ML injection   Subcutaneous   Inject 5-10 Units into the skin 2 (two) times daily. Pt uses 10 units in the morning and 5 units at bedtime.         . Iron-Vitamin C (VITRON-C) 65-125 MG TABS   Oral   Take 1 tablet by mouth daily at 12 noon.          [name] levothyroxine (SYNTHROID, LEVOTHROID) 100 MCG tablet   Oral   Take 1 tablet (100 mcg total) by mouth daily before breakfast. Patient not taking: Reported on 09/14/2014   30 tablet   11   . levothyroxine (SYNTHROID, LEVOTHROID) 125 MCG tablet   Oral   Take 125 mcg by mouth daily before breakfast.         .  LORazepam (ATIVAN) 1 MG tablet   Oral   Take 1 mg by mouth at bedtime.         . meclizine (ANTIVERT) 25 MG tablet   Oral   Take 1 tablet (25 mg total) by mouth 3 (three) times daily as needed for dizziness or nausea.   30 tablet   1   . metoprolol (LOPRESSOR) 100 MG tablet   Oral   Take 100 mg by mouth 2 (two) times daily.         . Multiple Vitamin (MULTIVITAMIN WITH MINERALS) TABS tablet   Oral   Take 1 tablet by mouth daily.         [name] neomycin-bacitracin-polymyxin (NEOSPORIN) ointment   Topical   Apply 1 application topically daily. Pt applies to right lower leg.         [name] omeprazole (PRILOSEC) 20 MG capsule   Oral   Take 20 mg by mouth daily.         [name] oxyCODONE-acetaminophen (PERCOCET/ROXICET) 5-325 MG per tablet   Oral   Take 1 tablet by mouth 2 (two) times daily as needed for severe pain.         . polyethylene glycol (MIRALAX / GLYCOLAX) packet   Oral   Take 17 g by mouth  daily.         . predniSONE (DELTASONE) 5 MG tablet   Oral   Take 7.5 mg by mouth daily.           Allergies Ascriptin a-d; Aspirin; Codeine; Hydrocodone; Statins; and Tramadol  Family History  Problem Relation Age of Onset  . Cancer Mother   . Cancer Sister     Social History Social History  Substance Use Topics  . Smoking status: Never Smoker   . Smokeless tobacco: Never Used  . Alcohol Use: No    Review of Systems Dementia severely limits her ability to obtain review of systems, patient denies everything. However, not reliably  ____________________________________________   PHYSICAL EXAM:  VITAL SIGNS: ED Triage Vitals  Enc Vitals Group     BP 11/20/14 2007 142/88 mmHg     Pulse Rate 11/20/14 2007 156     Resp 11/20/14 2007 17     Temp 11/20/14 2007 98.2 F (36.8 C)     Temp Source 11/20/14 2007 Oral     SpO2 11/20/14 2007 99 %     Weight 11/20/14 2007 110 lb (49.896 kg)     Height 11/20/14 2007 5\' 2"  (1.575 m)     Head Cir --      Peak Flow --      Pain Score --      Pain Loc --      Pain Edu? --      Excl. in [place]? --     Constitutional: Alert and oriented name and place. Well appearing and in no acute distress. Eyes: Conjunctivae are normal. PERRL. EOMI. Head: Atraumatic. Nose: No congestion/rhinnorhea. Mouth/Throat: Mucous membranes are dry.  Oropharynx non-erythematous. Neck: No stridor.   Nontender with no meningismus Cardiovascular: Tachycardia irregularly irregular Grossly normal heart sounds.  Good peripheral circulation. Respiratory: Normal respiratory effort.  No retractions. Lungs CTAB. Gastrointestinal: Soft and nontender. No distention. No guarding no rebound Back:  There is no focal tenderness or step off there is no midline tenderness there are no lesions noted. there is no CVA tenderness Musculoskeletal: No lower extremity tenderness. No joint effusions, no DVT signs strong distal pulses no edema Neurologic:  Normal  speech and  language. No gross focal neurologic deficits are appreciated.  Skin:  Skin is warm, dry and intact. No rash noted. Psychiatric: Mood and affect are normal. Speech and behavior are normal.  ____________________________________________   LABS (all labs ordered are listed, but only abnormal results are displayed)  Labs Reviewed  CBC WITH DIFFERENTIAL/PLATELET - Abnormal; Notable for the following:    WBC 12.1 (*)    Neutro Abs 8.1 (*)    Monocytes Absolute 1.4 (*)    All other components within normal limits  CULTURE, BLOOD (ROUTINE X 2)  CULTURE, BLOOD (ROUTINE X 2)  URINE CULTURE  PROTIME-INR  URINALYSIS COMPLETEWITH MICROSCOPIC ([facility] ONLY)  TSH  COMPREHENSIVE METABOLIC PANEL  LIPASE, BLOOD  TROPONIN I  LACTIC ACID, PLASMA  LACTIC ACID, PLASMA   ____________________________________________  EKG  I personally interpreted EKG,  First EKG shows a narrow complex tachycardia, rate 160, LVH, it is irregular most consistent with A. fib though SVT is also possible second EKG shows there are complex tachycardia rate 140. No acute ischemic changes some likely rate related ST changes. Most consistent with A. fib  ____________________________________________  RADIOLOGY  I personally reviewed x-ray ____________________________________________   PROCEDURES  Procedure(s) performed: None  Critical Care performed: CRITICAL CARE Performed by: Schuyler Amor   Total critical care time: 59 minutes  Critical care time was exclusive of separately billable procedures and treating other patients.  Critical care was necessary to treat or prevent imminent or life-threatening deterioration.  Critical care was time spent personally by me on the following activities: development of treatment plan with patient and/or surrogate as well as nursing, discussions with consultants, evaluation of patient's response to treatment, examination of patient, obtaining history from patient or  surrogate, ordering and performing treatments and interventions, ordering and review of laboratory studies, ordering and review of radiographic studies, pulse oximetry and re-evaluation of patient's condition.   ____________________________________________   INITIAL IMPRESSION / ASSESSMENT AND PLAN / ED COURSE  Pertinent labs & imaging results that were available during my care of the patient were reviewed by me and considered in my medical decision making (see chart for details).  Patient with history of SVT per notes presents today complaining of diarrhea and sick BP. Her heart rate is between 120 and 160, quite variable and most consistent with atrial fibrillation not SVT given the rates in the variability. We'll start her on a Cardizem drip as it appears that she has missed her evening dose of Cardizem according to the nursinghome.She has no abdominal pain or tenderness.She is at her baseline mentally. We are giving her IV fluid which is helping bring down her heart rate ,given that her blood pressures are in the 140s to 150s, thick we do have some room to give Cardizem which I think will help her pressure as well as help her feel better. In addition, patient has a history of demand ischemia placing her at some risk for untreated tachycardia. Patient does have a DO NOT RESUSCITATE order at bedside ____________________________________________ ----------------------------------------- 9:02 PM on 11/20/2014 -----------------------------------------  Heart rate closer to 110-140 now rather than 140-160. Family at bedside they state that the patient does have a history of atrial fibrillation one time in the past. Patient is in no significant distress at this time. The initial bolus of Cardizem has helped we are waiting for the infusion from the lab, I will give another bolus at this time as her pressure is holding strong. She remains nonfocal in her neurologic  exam.  ----------------------------------------- 9:28 PM on 11/20/2014 -----------------------------------------  Heart rate now in the 90s and low 100s, blood pressure is now in the 140s, patient a symptomatically at this time. Blood work is noted. Have admitted her to the hospitalist service.  ----------------------------------------- 10:05 PM on 11/20/2014 -----------------------------------------  Remains chest pain-free we are titrating the drip up, heart rate is 116 at this moment but sometimes goes up to 130s and 140s again. Still remains in A. fib. Patient has an aspirin allergy and we will defer.   FINAL CLINICAL IMPRESSION(S) / ED DIAGNOSES  Final diagnoses:  Tachycardia     Schuyler Amor, MD 11/20/14 2036  Schuyler Amor, MD 11/20/14 2103  Schuyler Amor, MD 11/20/14 2129  Schuyler Amor, MD 11/20/14 9191  Schuyler Amor, MD 11/20/14 2206

## 2014-11-20 NOTE — ED Notes (Signed)
Pt presents to ED via [facility] from [place] with C/o N/V/D/syncope. Per EMS pt has been in the 150-160 hr since they arrived. EMS states per family she had a temperature, EMS states temp 98.2 and BP 119/61.

## 2014-11-20 NOTE — H&P (Signed)
[facility] NAME: Amanda Stark    MR#:  [id number]  DATE OF BIRTH:  1922/06/24   DATE OF ADMISSION:  11/20/2014  PRIMARY CARE PHYSICIAN: Rusty Aus., MD   REQUESTING/REFERRING PHYSICIAN: McShane  CHIEF COMPLAINT:   Chief Complaint  Patient presents with  . Nausea  . Emesis  . Diarrhea  . Loss of Consciousness    HISTORY OF PRESENT ILLNESS:  Amanda Stark  is a 81 y.o. female with a known history of hypothyroidism unspecified who is presenting after syncopal episode. Patient unable to provide meaningful information given mental status/medical condition history provided by daughter present at bedside. She states 1 day duration of altered mental status described mainly as confusion as well as weakness/fatigue. They're attempting to ambulate to the bathroom when she became lightheaded and subsequently passed out. Loss of consciousness lasting approximately 30-40 seconds no head trauma no fall no loss of bowel/bladder function. In emergency department noted to be in atrial fibrillation rapid ventricular response, received Cardizem bolus 2 started on Cardizem drip  PAST MEDICAL HISTORY:   Past Medical History  Diagnosis Date  . SVT (supraventricular tachycardia) ([place])   . Thyroid disease   . Instability of right shoulder joint   . Glenohumeral arthritis   . Diabetes mellitus without complication ([place])   . Hypertension   . Hyperlipidemia   . PMR (polymyalgia rheumatica) ([facility])   . Lumbar spinal stenosis   . Osteoporosis   . Thyroid cancer ([place])   . Esophageal cancer ([place])   . Vertigo     PAST SURGICAL HISTORY:   Past Surgical History  Procedure Laterality Date  . Total thyroidectomy    . Kidney stone removal    . Right lens implant    . Appendectomy    . Abdominal hysterectomy    . Thyroidectomy    . Back surgery      SOCIAL HISTORY:   Social History  Substance Use Topics  . Smoking status:  Never Smoker   . Smokeless tobacco: Never Used  . Alcohol Use: No    FAMILY HISTORY:   Family History  Problem Relation Age of Onset  . Cancer Mother   . Cancer Sister     DRUG ALLERGIES:   Allergies  Allergen Reactions  . Ascriptin A-D [Aspirin Buf(Alhyd-Mghyd-Cacar)] Other (See Comments)    Reaction:  Unknown   . Aspirin Other (See Comments)    Reaction:  Unknown   . Codeine Other (See Comments)    Reaction:  Unknown   . Hydrocodone Other (See Comments)    Reaction:  Unknown   . Statins Other (See Comments)    Reaction:  Unknown    . Tramadol Other (See Comments)    Reaction:  Unknown     REVIEW OF SYSTEMS:  Unable to obtain given patient's mental status/medical condition   MEDICATIONS AT HOME:   Prior to Admission medications   Medication Sig Start Date End Date Taking? Authorizing Provider  acetaminophen (TYLENOL) 650 MG CR tablet Take 650 mg by mouth every 4 (four) hours as needed for pain or fever.   Yes Historical Provider, MD  Calcium Carb-Cholecalciferol (CALCIUM 600+D3 PO) Take 1 tablet by mouth 2 (two) times daily.   Yes Historical Provider, MD  Cholecalciferol (VITAMIN D3) 5000 UNITS TABS Take 5,000 Units by mouth daily.   Yes Historical Provider, MD  diltiazem (CARDIZEM) 30 MG tablet Take 30 mg by mouth 3 (three) times daily.  Yes Historical Provider, MD  docusate sodium (COLACE) 100 MG capsule Take 100 mg by mouth 2 (two) times daily.   Yes Historical Provider, MD  insulin detemir (LEVEMIR) 100 UNIT/ML injection Inject 5-15 Units into the skin 2 (two) times daily. Pt uses 15 units in the morning and 5 units at bedtime.   Yes Historical Provider, MD  Iron-Vitamin C (VITRON-C) 65-125 MG TABS Take 1 tablet by mouth daily at 12 noon.    Yes Historical Provider, MD  levothyroxine (SYNTHROID, LEVOTHROID) 125 MCG tablet Take 125 mcg by mouth daily before breakfast.   Yes Historical Provider, MD  Liniments (SALONPAS PAIN RELIEF PATCH EX) Apply 1 patch topically  daily. Apply for 12 hours, then remove   Yes Historical Provider, MD  LORazepam (ATIVAN) 1 MG tablet Take 1 mg by mouth at bedtime.   Yes Historical Provider, MD  meclizine (ANTIVERT) 25 MG tablet Take 1 tablet (25 mg total) by mouth 3 (three) times daily as needed for dizziness or nausea. 09/14/14  Yes Earleen Newport, MD  metoprolol (LOPRESSOR) 100 MG tablet Take 100 mg by mouth 2 (two) times daily.   Yes Historical Provider, MD  Multiple Vitamin (MULTIVITAMIN WITH MINERALS) TABS tablet Take 1 tablet by mouth daily.   Yes Historical Provider, MD  neomycin-bacitracin-polymyxin (NEOSPORIN) ointment Apply 1 application topically daily. Pt applies to right lower leg.   Yes Historical Provider, MD  omeprazole (PRILOSEC) 20 MG capsule Take 20 mg by mouth daily.   Yes Historical Provider, MD  polyethylene glycol (MIRALAX / GLYCOLAX) packet Take 17 g by mouth daily.   Yes Historical Provider, MD  predniSONE (DELTASONE) 5 MG tablet Take 7.5 mg by mouth daily.   Yes Historical Provider, MD  levothyroxine (SYNTHROID, LEVOTHROID) 100 MCG tablet Take 1 tablet (100 mcg total) by mouth daily before breakfast. Patient not taking: Reported on 09/14/2014 08/03/14   Rusty Aus, MD      VITAL SIGNS:  Blood pressure 140/102, pulse 85, temperature 98.2 F (36.8 C), temperature source Oral, resp. rate 21, height 5\' 2"  (1.575 m), weight 110 lb (49.896 kg), SpO2 97 %.  PHYSICAL EXAMINATION:  VITAL SIGNS: Filed Vitals:   11/20/14 2245  BP: 140/102  Pulse: 85  Temp:   Resp: 21   GENERAL:81 y.o.female currently in no acute distress. Pleasantly confused HEAD: Normocephalic, atraumatic.  EYES: Pupils equal, round, reactive to light. Extraocular muscles intact. No scleral icterus.  MOUTH: Moist mucosal membrane. Dentition intact. No abscess noted.  EAR, NOSE, THROAT: Clear without exudates. No external lesions.  NECK: Supple. No thyromegaly. No nodules. No JVD.  PULMONARY: Clear to ascultation, without wheeze  rails or rhonci. No use of accessory muscles, Good respiratory effort. good air entry bilaterally CHEST: Nontender to palpation.  CARDIOVASCULAR: S1 and S2. Irregular rate and rhythm. No murmurs, rubs, or gallops. No edema. Pedal pulses 2+ bilaterally.  GASTROINTESTINAL: Soft, nontender, nondistended. No masses. Positive bowel sounds. No hepatosplenomegaly.  MUSCULOSKELETAL: No swelling, clubbing, or edema. Range of motion full in all extremities.  NEUROLOGIC: Cranial nerves II through XII are intact. No gross focal neurological deficits. Sensation intact. Reflexes intact.  SKIN: No ulceration, lesions, rashes, or cyanosis. Skin warm and dry. Turgor intact.  PSYCHIATRIC: Mood, affect within normal limits. The patient is awake, alert and oriented x 3. Insight, judgment intact.    LABORATORY PANEL:   CBC  Recent Labs Lab 11/20/14 2009  WBC 12.1*  HGB 12.6  HCT 37.6  PLT 379   ------------------------------------------------------------------------------------------------------------------  Chemistries   Recent Labs Lab 11/20/14 2009  NA 134*  K 3.8  CL 95*  CO2 29  GLUCOSE 207*  BUN 30*  CREATININE 0.81  CALCIUM 9.4  AST 23  ALT 18  ALKPHOS 86  BILITOT 0.7   ------------------------------------------------------------------------------------------------------------------  Cardiac Enzymes  Recent Labs Lab 11/20/14 2009  TROPONINI 0.05*   ------------------------------------------------------------------------------------------------------------------  RADIOLOGY:  Dg Chest Port 1 View  11/20/2014  CLINICAL DATA:  Tachycardia.  Nausea and vomiting.  Near syncope. EXAM: PORTABLE CHEST 1 VIEW COMPARISON:  Chest x-rays dated 10/07/2014, 08/08/2014 and 08/02/2014 FINDINGS: There is chronic cardiomegaly with a chronic large hiatal hernia. Lungs are hyperinflated with flattening of the diaphragm consistent with emphysema. Pulmonary vascularity is normal. No infiltrates or  effusions. Scarring at the right lung base laterally. Old rib deformities anterior laterally on the right. Severe arthritic changes at the right shoulder with deformity of the right scapula which is chronic. IMPRESSION: No acute abnormalities. Chronic cardiomegaly. Emphysema. Large hiatal hernia. Electronically Signed   By: Lorriane Shire M.D.   On: 11/20/2014 21:03    EKG:   Orders placed or performed during the hospital encounter of 11/20/14  . ED EKG  . ED EKG    IMPRESSION AND PLAN:   81 year old Caucasian female history of type 2 diabetes insulin requiring, hypothyroidism unspecified presenting with syncope.  1. Atrial fibrillation with rapid ventricular response: Placed on Cardizem drip, goal heart rate less than 120, admit to stepdown, continue telemetry, cardiac enzymes 3, check TSH 2. Hypothyroidism unspecified: Continue Synthroid check free T4 3. Type 2 diabetes insulin requiring continue basal insulin at insulin sliding scale 4. GERD without esophagitis: PPI therapy 5. Venous thrombi embolism prophylactic: Heparin subcutaneous     All the records are reviewed and case discussed with ED provider. Management plans discussed with the patient, family and they are in agreement.  CODE STATUS: DO NOT RESUSCITATE  TOTAL TIME TAKING CARE OF THIS PATIENT: 45 minutes.    Hower,  Karenann Cai.D on 11/20/2014 at 11:14 PM  Between 7am to 6pm - Pager - [phone]  After 6pm: House Pager: - [phone]  [facility]  Office  [phone]  CC: Primary care physician; Rusty Aus., MD

## 2014-11-21 LAB — GLUCOSE, CAPILLARY
GLUCOSE-CAPILLARY: 143 mg/dL — AB (ref 65–99)
GLUCOSE-CAPILLARY: 34 mg/dL — AB (ref 65–99)
GLUCOSE-CAPILLARY: 86 mg/dL (ref 65–99)
GLUCOSE-CAPILLARY: 278 mg/dL — AB (ref 65–99)
Glucose-Capillary: 120 mg/dL — ABNORMAL HIGH (ref 65–99)
Glucose-Capillary: 176 mg/dL — ABNORMAL HIGH (ref 65–99)
Glucose-Capillary: 204 mg/dL — ABNORMAL HIGH (ref 65–99)
Glucose-Capillary: 35 mg/dL — CL (ref 65–99)
Glucose-Capillary: 37 mg/dL — CL (ref 65–99)
Glucose-Capillary: 62 mg/dL — ABNORMAL LOW (ref 65–99)
Glucose-Capillary: 68 mg/dL (ref 65–99)

## 2014-11-21 LAB — TROPONIN I
TROPONIN I: 0.12 ng/mL — AB (ref <0.031)
Troponin I: 0.09 ng/mL — ABNORMAL HIGH (ref <0.031)
Troponin I: 0.1 ng/mL — ABNORMAL HIGH (ref <0.031)

## 2014-11-21 LAB — MRSA PCR SCREENING: MRSA by PCR: NEGATIVE

## 2014-11-21 MED ORDER — LEVOTHYROXINE SODIUM 125 MCG PO TABS
125.0000 ug | ORAL_TABLET | Freq: Every day | ORAL | Status: DC
  Administered 2014-11-21 – 2014-11-23 (×3): 125 ug via ORAL
  Filled 2014-11-21 (×3): qty 1

## 2014-11-21 MED ORDER — INSULIN DETEMIR 100 UNIT/ML ~~LOC~~ SOLN
15.0000 [IU] | Freq: Every morning | SUBCUTANEOUS | Status: DC
  Administered 2014-11-21: 15 [IU] via SUBCUTANEOUS
  Filled 2014-11-21 (×2): qty 0.15

## 2014-11-21 MED ORDER — ADULT MULTIVITAMIN W/MINERALS CH
1.0000 | ORAL_TABLET | Freq: Every day | ORAL | Status: DC
  Administered 2014-11-21 – 2014-11-23 (×3): 1 via ORAL
  Filled 2014-11-21 (×3): qty 1

## 2014-11-21 MED ORDER — PREDNISONE 5 MG PO TABS
7.5000 mg | ORAL_TABLET | Freq: Every day | ORAL | Status: DC
  Administered 2014-11-21 – 2014-11-23 (×3): 7.5 mg via ORAL
  Filled 2014-11-21: qty 1
  Filled 2014-11-21 (×2): qty 2

## 2014-11-21 MED ORDER — DILTIAZEM HCL 60 MG PO TABS
60.0000 mg | ORAL_TABLET | Freq: Three times a day (TID) | ORAL | Status: DC
  Administered 2014-11-21 – 2014-11-22 (×4): 60 mg via ORAL
  Filled 2014-11-21 (×4): qty 1

## 2014-11-21 MED ORDER — METOPROLOL TARTRATE 100 MG PO TABS
100.0000 mg | ORAL_TABLET | Freq: Two times a day (BID) | ORAL | Status: DC
  Administered 2014-11-21 (×3): 100 mg via ORAL
  Filled 2014-11-21 (×2): qty 2
  Filled 2014-11-21: qty 1

## 2014-11-21 MED ORDER — INSULIN DETEMIR 100 UNIT/ML ~~LOC~~ SOLN: 5.0000 [IU] | Freq: Two times a day (BID) | SUBCUTANEOUS | Status: DC

## 2014-11-21 MED ORDER — POLYETHYLENE GLYCOL 3350 17 G PO PACK
17.0000 g | PACK | Freq: Every day | ORAL | Status: DC
  Administered 2014-11-21 – 2014-11-23 (×3): 17 g via ORAL
  Filled 2014-11-21 (×3): qty 1

## 2014-11-21 MED ORDER — INSULIN ASPART 100 UNIT/ML ~~LOC~~ SOLN
0.0000 [IU] | Freq: Every day | SUBCUTANEOUS | Status: DC
  Administered 2014-11-21: 2 [IU] via SUBCUTANEOUS
  Administered 2014-11-22: 4 [IU] via SUBCUTANEOUS
  Filled 2014-11-21: qty 2
  Filled 2014-11-21: qty 4

## 2014-11-21 MED ORDER — INSULIN ASPART 100 UNIT/ML ~~LOC~~ SOLN
0.0000 [IU] | Freq: Three times a day (TID) | SUBCUTANEOUS | Status: DC
  Administered 2014-11-21: 1 [IU] via SUBCUTANEOUS
  Administered 2014-11-21: 5 [IU] via SUBCUTANEOUS
  Filled 2014-11-21: qty 1
  Filled 2014-11-21: qty 5

## 2014-11-21 MED ORDER — LORAZEPAM 1 MG PO TABS
1.0000 mg | ORAL_TABLET | Freq: Every day | ORAL | Status: DC
  Administered 2014-11-21 – 2014-11-22 (×2): 1 mg via ORAL
  Filled 2014-11-21 (×2): qty 1

## 2014-11-21 MED ORDER — DOCUSATE SODIUM 100 MG PO CAPS
100.0000 mg | ORAL_CAPSULE | Freq: Two times a day (BID) | ORAL | Status: DC
  Administered 2014-11-21 – 2014-11-23 (×5): 100 mg via ORAL
  Filled 2014-11-21 (×5): qty 1

## 2014-11-21 MED ORDER — SOTALOL HCL 80 MG PO TABS
40.0000 mg | ORAL_TABLET | Freq: Two times a day (BID) | ORAL | Status: DC
  Administered 2014-11-21 – 2014-11-23 (×5): 40 mg via ORAL
  Filled 2014-11-21: qty 0.5
  Filled 2014-11-21 (×2): qty 1
  Filled 2014-11-21: qty 0.5
  Filled 2014-11-21: qty 1

## 2014-11-21 MED ORDER — PANTOPRAZOLE SODIUM 40 MG PO TBEC
40.0000 mg | DELAYED_RELEASE_TABLET | Freq: Every day | ORAL | Status: DC
  Administered 2014-11-21 – 2014-11-23 (×3): 40 mg via ORAL
  Filled 2014-11-21 (×3): qty 1

## 2014-11-21 MED ORDER — INSULIN DETEMIR 100 UNIT/ML ~~LOC~~ SOLN
5.0000 [IU] | Freq: Every day | SUBCUTANEOUS | Status: DC
  Administered 2014-11-21 – 2014-11-22 (×3): 5 [IU] via SUBCUTANEOUS
  Filled 2014-11-21 (×4): qty 0.05

## 2014-11-21 MED ORDER — CETYLPYRIDINIUM CHLORIDE 0.05 % MT LIQD
7.0000 mL | Freq: Two times a day (BID) | OROMUCOSAL | Status: DC
  Administered 2014-11-21 – 2014-11-23 (×5): 7 mL via OROMUCOSAL

## 2014-11-21 NOTE — Progress Notes (Signed)
Amanda Stark is a 81 y.o. female   SUBJECTIVE:  Patient admitted with rapid A. fib and syncope. No focal complaints, patient back in normal sinus rhythm on IV diltiazem.  ______________________________________________________________________  ROS: Review of systems is unremarkable for any active cardiac,respiratory, GI, GU, hematologic, neurologic or psychiatric systems, 10 systems reviewed.  [name] adenosine      . antiseptic oral rinse  7 mL Mouth Rinse BID  . diltiazem  60 mg Oral 3 times per day  . docusate sodium  100 mg Oral BID  . heparin  5,000 Units Subcutaneous 3 times per day  . insulin aspart  0-5 Units Subcutaneous QHS  . insulin aspart  0-9 Units Subcutaneous TID WC  . insulin detemir  15 Units Subcutaneous q morning - 10a  . insulin detemir  5 Units Subcutaneous QHS  . levothyroxine  125 mcg Oral QAC breakfast  . LORazepam  1 mg Oral QHS  . metoprolol  100 mg Oral BID  . multivitamin with minerals  1 tablet Oral Daily  . pantoprazole  40 mg Oral Daily  . polyethylene glycol  17 g Oral Daily  . predniSONE  7.5 mg Oral Daily  . sodium chloride  3 mL Intravenous Q12H   acetaminophen **OR** acetaminophen, hydrALAZINE, ondansetron **OR** ondansetron (ZOFRAN) IV   Past Medical History  Diagnosis Date  . SVT (supraventricular tachycardia) ([place])   . Thyroid disease   . Instability of right shoulder joint   . Glenohumeral arthritis   . Diabetes mellitus without complication ([place])   . Hypertension   . Hyperlipidemia   . PMR (polymyalgia rheumatica) ([facility])   . Lumbar spinal stenosis   . Osteoporosis   . Thyroid cancer ([place])   . Esophageal cancer ([place])   . Vertigo     Past Surgical History  Procedure Laterality Date  . Total thyroidectomy    . Kidney stone removal    . Right lens implant    . Appendectomy    . Abdominal hysterectomy    . Thyroidectomy    . Back surgery      PHYSICAL EXAM:  BP 119/58 mmHg  Pulse 64  Temp(Src) 99.5 F (37.5 C) (Oral)   Resp 20  Ht 5\' 5"  (1.651 m)  Wt 49.896 kg (110 lb)  BMI 18.31 kg/m2  SpO2 96%  Wt Readings from Last 3 Encounters:  11/20/14 49.896 kg (110 lb)  10/07/14 48.535 kg (107 lb)  09/14/14 48.716 kg (107 lb 6.4 oz)           BP Readings from Last 3 Encounters:  11/21/14 119/58  10/07/14 123/93  09/14/14 167/72    Constitutional: NAD Neck: supple, no thyromegaly Respiratory: CTA, no rales or wheezes Cardiovascular: RRR, no murmur, no gallop Abdomen: soft, good BS, nontender Extremities: no edema Neuro: alert and oriented, no focal motor or sensory deficits  ASSESSMENT/PLAN:  Labs and imaging studies were reviewed  Rapid A. fib-back in normal sinus rhythm, reactive by mouth diltiazem, wean IV diltiazem, cardiology thoughts, troponins mildly elevated, likely demand ischemia Lumbar spinal stenosis-ambulation limited Diabetes mellitus, insulin requiring-p.m. sugars have been elevated, has been on Levemir 10 units a.m., 5 units p.m., recently increased to 15 units a.m., 5 units p.m., follow sugars Chronic right shoulder dysfunction-follow Out of unit when off IV diltiazem

## 2014-11-21 NOTE — Care Management Note (Addendum)
Case Management Note  Patient Details  Name: Amanda Stark MRN: [id number] Date of Birth: October 04, 1922  Subjective/Objective:  Admitted with Atrial fib with RVR, on Cardizem gtt. Patient from [facility]. Spoke with Cat at [facility]. Patient is active with [facility] with RN coming out for wound care, PT. Cheryl with [facility] notified of admission.                   Action/Plan: CSW made aware. Will follow progression.   Expected Discharge Date:                  Expected Discharge Plan:  Assisted Living / Rest Home  In-House Referral:  Clinical Social Work  Discharge planning Services  CM Consult  Post Acute Care Choice:    Choice offered to:     DME Arranged:    DME Agency:     HH Arranged:    HH Agency:     Status of Service:  In process, will continue to follow  Medicare Important Message Given:    Date Medicare IM Given:    Medicare IM give by:    Date Additional Medicare IM Given:    Additional Medicare Important Message give by:     If discussed at Center of Stay Meetings, dates discussed:    Additional Comments:  Jolly Mango, RN 11/21/2014, 9:24 AM

## 2014-11-21 NOTE — Progress Notes (Signed)
Initial Nutrition Assessment  DOCUMENTATION CODES:   Severe malnutrition in context of chronic illness  INTERVENTION:  Meals and snacks: Cater to pt preferences Medical Nutrition Supplement Therapy: will add mightyshake TID for added nutrition   NUTRITION DIAGNOSIS:   Malnutrition related to chronic illness as evidenced by severe depletion of body fat, moderate depletion of body fat, moderate depletions of muscle mass, severe depletion of muscle mass.    GOAL:   Patient will meet greater than or equal to 90% of their needs    MONITOR:    (Energy intake, electrolyte and renal profile)  REASON FOR ASSESSMENT:   Malnutrition Screening Tool    ASSESSMENT:      Pt admitted with rapid afib, syncope, nausea, vomiting, diarrhea prior to admission  Past Medical History  Diagnosis Date  . SVT (supraventricular tachycardia) ([place])   . Thyroid disease   . Instability of right shoulder joint   . Glenohumeral arthritis   . Diabetes mellitus without complication ([place])   . Hypertension   . Hyperlipidemia   . PMR (polymyalgia rheumatica) ([facility])   . Lumbar spinal stenosis   . Osteoporosis   . Thyroid cancer ([place])   . Esophageal cancer ([place])   . Vertigo     Current Nutrition: pt reports that she ate breakfast this am but unsure accuracy as seems confused.  Noted empty coffee cup and juice at bedside  Food/Nutrition-Related History: Pt reports eats 3 meals per day at [facility]. Unsure accuracy??   Scheduled Medications:  . antiseptic oral rinse  7 mL Mouth Rinse BID  . diltiazem  60 mg Oral 3 times per day  . docusate sodium  100 mg Oral BID  . heparin  5,000 Units Subcutaneous 3 times per day  . insulin aspart  0-5 Units Subcutaneous QHS  . insulin aspart  0-9 Units Subcutaneous TID WC  . insulin detemir  15 Units Subcutaneous q morning - 10a  . insulin detemir  5 Units Subcutaneous QHS  . levothyroxine  125 mcg Oral QAC breakfast  . LORazepam  1 mg Oral QHS  .  metoprolol  100 mg Oral BID  . multivitamin with minerals  1 tablet Oral Daily  . pantoprazole  40 mg Oral Daily  . polyethylene glycol  17 g Oral Daily  . predniSONE  7.5 mg Oral Daily  . sodium chloride  3 mL Intravenous Q12H  . sotalol  40 mg Oral Q12H    Continuous Medications:  . sodium chloride 50 mL/hr at 11/21/14 0700  . diltiazem (CARDIZEM) infusion 5 mg/hr (11/21/14 0700)     Electrolyte/Renal Profile and Glucose Profile:   Recent Labs Lab 11/20/14 2009  NA 134*  K 3.8  CL 95*  CO2 29  BUN 30*  CREATININE 0.81  CALCIUM 9.4  GLUCOSE 207*   Protein Profile:  Recent Labs Lab 11/20/14 2009  ALBUMIN 3.3*    Gastrointestinal Profile: Last BM: 10/24 smear   Nutrition-Focused Physical Exam Findings: Nutrition-Focused physical exam completed. Findings are moderate to severe fat depletion, moderate to severe muscle depletion, and  no edema.      Weight Change: per wt encounters no wt loss    Diet Order:  Diet Heart Room service appropriate?: Yes; Fluid consistency:: Thin  Skin:   reviewed   Height:   Ht Readings from Last 1 Encounters:  11/21/14 5\' 5"  (1.651 m)    Weight:   Wt Readings from Last 1 Encounters:  11/20/14 110 lb (49.896 kg)  BMI:  Body mass index is 18.31 kg/(m^2).  Estimated Nutritional Needs:   Kcal:  BEE 920 kcals (IF 1.0-1.2, AF 1.3) 7981-0254 kcals/d.   Protein:  (1.0-1.2 g/kg) 50-60 g/d  Fluid:  (25-59ml/kg) 1250-1528ml/d  EDUCATION NEEDS:   No education needs identified at this time  HIGH Care Level  Keilly Fatula B. Zenia Resides, Power, [place] (pager)

## 2014-11-21 NOTE — Clinical Social Work Note (Signed)
Clinical Social Work Assessment  Patient Details  Name: Amanda Stark MRN: [id number] Date of Birth: 02/23/1922  Date of referral:  11/21/14               Reason for consult:  Facility Placement                Permission sought to share information with:  Facility Contact Representative Permission granted to share information::  Yes, Verbal Permission Granted  Name::        Agency::     Relationship::     Contact Information:     Housing/Transportation Living arrangements for the past 2 months:  Assisted Living Facility Source of Information:  Patient Patient Interpreter Needed:  None Criminal Activity/Legal Involvement Pertinent to Current Situation/Hospitalization:  No - Comment as needed Significant Relationships:  Adult Children Lives with:  Facility Resident Do you feel safe going back to the place where you live?  Yes Need for family participation in patient care:  No (Coment)  Care giving concerns:  Patient is a resident at [facility]   Social Worker assessment / plan:  Patient transferred out of ICU and is now on the medical floor. CSW met with patient this afternoon who was alert and oriented X3. Patient was able to explain that she has been at [facility] for a while and that prior to that she was at [facility]. Patient states that she enjoys [facility] and that she likes the food much better. She explained that she sold her house and now wishes she did not but stated that she needed to in order to move to assisted living level of care. Patient told CSW about her three children and their names and where they live. Patient is in agreement to return to [facility] when time.   CSW contacted [facility] and was informed that Bonnie, the administrator, was out of town until Wednesday and this CSW was given to a med tech, Kate, who stated that they could take patient back at discharge. CSW will attempt to confirm this as well as typically med techs are not able to give that  determination.  Employment status:  Retired Insurance information:  Managed Medicare PT Recommendations:  Not assessed at this time Information / Referral to community resources:     Patient/Family's Response to care:  Patient very appreciative of CSW visit.  Patient/Family's Understanding of and Emotional Response to Diagnosis, Current Treatment, and Prognosis:  Patient is anticipating being able to discharge soon and return to [facility].  Emotional Assessment Appearance:  Appears younger than stated age Attitude/Demeanor/Rapport:   (pleasant and cooperative) Affect (typically observed):  Accepting, Appropriate Orientation:  Oriented to Self, Oriented to Place, Oriented to Situation Alcohol / Substance use:  Not Applicable Psych involvement (Current and /or in the community):  No (Comment)  Discharge Needs  Concerns to be addressed:  Care Coordination Readmission within the last 30 days:  No Current discharge risk:  None Barriers to Discharge:  No Barriers Identified    , LCSW 11/21/2014, 3:14 PM  

## 2014-11-21 NOTE — Progress Notes (Signed)
Pt. Transferred from ICU to 2A, rm 255. Report received from [place], CCU RN. Oriented to room, call bell, Ascom phones and staff. Bed in low position. Fall safety plan reviewed, pt is confused and unable to sign fall contract, confusion noted on contract, signed by RN and placed on wall, yellow non-skid socks in place, bed alarm on. Full assessment to Epic. Will continue to monitor.

## 2014-11-21 NOTE — Consult Note (Signed)
[facility] Cardiology  CARDIOLOGY CONSULT NOTE  Patient ID: Amanda Stark MRN: [id number] DOB/AGE: 1922-09-15 81 y.o.  Admit date: 11/20/2014 Referring Physician Valentino Nose M.D. Primary Physician Emily Filbert M.D. Primary Cardiologist Reason for Consultation atrial fibrillation  HPI: The patient is a 81 year old female referred for evaluation of syncope and atrial fibrillation. The patient is a resident at the [facility]. She apparently was in her usual state of health until one day prior to admission when she started to experience increasing weakness, fatigue, confusion and experienced an apparent syncopal episode trying to ambulate to the bathroom. The patient had apparent loss of consciousness without falling or trauma. Patient presented to [facility] emergency room where she was noted to be in atrial fibrillation with rapid ventricular rate, treated with Cardizem bolus and drip and converted to sinus rhythm. Admission labs were notable for borderline elevated troponin. Patient currently in sinus rhythm, hemodynamically and clinically stable.  Review of systems complete and found to be negative unless listed above     Past Medical History  Diagnosis Date  . SVT (supraventricular tachycardia) ([place])   . Thyroid disease   . Instability of right shoulder joint   . Glenohumeral arthritis   . Diabetes mellitus without complication ([place])   . Hypertension   . Hyperlipidemia   . PMR (polymyalgia rheumatica) (H[facility])   . Lumbar spinal stenosis   . Osteoporosis   . Thyroid cancer ([place])   . Esophageal cancer ([place])   . Vertigo     Past Surgical History  Procedure Laterality Date  . Total thyroidectomy    . Kidney stone removal    . Right lens implant    . Appendectomy    . Abdominal hysterectomy    . Thyroidectomy    . Back surgery      Prescriptions prior to admission  Medication Sig Dispense Refill Last Dose  . acetaminophen (TYLENOL) 650 MG CR tablet Take 650 mg by mouth every 4 (four) hours  as needed for pain or fever.   prn at prn  . Calcium Carb-Cholecalciferol (CALCIUM 600+D3 PO) Take 1 tablet by mouth 2 (two) times daily.   11/20/2014 at Unknown time  . Cholecalciferol (VITAMIN D3) 5000 UNITS TABS Take 5,000 Units by mouth daily.   11/20/2014 at Unknown time  . diltiazem (CARDIZEM) 30 MG tablet Take 30 mg by mouth 3 (three) times daily.   11/20/2014 at 0900  . docusate sodium (COLACE) 100 MG capsule Take 100 mg by mouth 2 (two) times daily.   11/20/2014 at Unknown time  . insulin detemir (LEVEMIR) 100 UNIT/ML injection Inject 5-15 Units into the skin 2 (two) times daily. Pt uses 15 units in the morning and 5 units at bedtime.   11/20/2014 at Unknown time  . Iron-Vitamin C (VITRON-C) 65-125 MG TABS Take 1 tablet by mouth daily at 12 noon.    11/20/2014 at Unknown time  . levothyroxine (SYNTHROID, LEVOTHROID) 125 MCG tablet Take 125 mcg by mouth daily before breakfast.   11/20/2014 at Unknown time  . Liniments (SALONPAS PAIN RELIEF PATCH EX) Apply 1 patch topically daily. Apply for 12 hours, then remove   11/20/2014 at Unknown time  . LORazepam (ATIVAN) 1 MG tablet Take 1 mg by mouth at bedtime.   11/19/2014 at Unknown time  . meclizine (ANTIVERT) 25 MG tablet Take 1 tablet (25 mg total) by mouth 3 (three) times daily as needed for dizziness or nausea. 30 tablet 1 prn at prn  . metoprolol (LOPRESSOR) 100 MG  tablet Take 100 mg by mouth 2 (two) times daily.   11/20/2014 at 0900  . Multiple Vitamin (MULTIVITAMIN WITH MINERALS) TABS tablet Take 1 tablet by mouth daily.   11/20/2014 at Unknown time  . neomycin-bacitracin-polymyxin (NEOSPORIN) ointment Apply 1 application topically daily. Pt applies to right lower leg.   11/20/2014 at Unknown time  . omeprazole (PRILOSEC) 20 MG capsule Take 20 mg by mouth daily.   11/20/2014 at Unknown time  . polyethylene glycol (MIRALAX / GLYCOLAX) packet Take 17 g by mouth daily.   11/20/2014 at Unknown time  . predniSONE (DELTASONE) 5 MG tablet Take 7.5  mg by mouth daily.   11/20/2014 at Unknown time  . levothyroxine (SYNTHROID, LEVOTHROID) 100 MCG tablet Take 1 tablet (100 mcg total) by mouth daily before breakfast. (Patient not taking: Reported on 09/14/2014) 30 tablet 11 Not Taking at Unknown time   Social History   Social History  . Marital Status: Widowed    Spouse Name: N/A  . Number of Children: N/A  . Years of Education: N/A   Occupational History  . Not on file.   Social History Main Topics  . Smoking status: Never Smoker   . Smokeless tobacco: Never Used  . Alcohol Use: No  . Drug Use: No  . Sexual Activity: Not Currently   Other Topics Concern  . Not on file   Social History Narrative    Family History  Problem Relation Age of Onset  . Cancer Mother   . Cancer Sister       Review of systems complete and found to be negative unless listed above      PHYSICAL EXAM  General: Well developed, well nourished, in no acute distress HEENT:  Normocephalic and atramatic Neck:  No JVD.  Lungs: Clear bilaterally to auscultation and percussion. Heart: HRRR . Normal S1 and S2 without gallops or murmurs.  Abdomen: Bowel sounds are positive, abdomen soft and non-tender  Msk:  Back normal, normal gait. Normal strength and tone for age. Extremities: No clubbing, cyanosis or edema.   Neuro: Alert and oriented X 3. Psych:  Good affect, responds appropriately, patient confused with baseline dementia  Labs:   Lab Results  Component Value Date   WBC 12.1* 11/20/2014   HGB 12.6 11/20/2014   HCT 37.6 11/20/2014   MCV 82.2 11/20/2014   PLT 379 11/20/2014    Recent Labs Lab 11/20/14 2009  NA 134*  K 3.8  CL 95*  CO2 29  BUN 30*  CREATININE 0.81  CALCIUM 9.4  PROT 7.0  BILITOT 0.7  ALKPHOS 86  ALT 18  AST 23  GLUCOSE 207*   Lab Results  Component Value Date   TROPONINI 0.09* 11/21/2014   No results found for: CHOL No results found for: HDL No results found for: LDLCALC No results found for: TRIG No  results found for: CHOLHDL No results found for: LDLDIRECT    Radiology: Dg Chest Port 1 View  11/20/2014  CLINICAL DATA:  Tachycardia.  Nausea and vomiting.  Near syncope. EXAM: PORTABLE CHEST 1 VIEW COMPARISON:  Chest x-rays dated 10/07/2014, 08/08/2014 and 08/02/2014 FINDINGS: There is chronic cardiomegaly with a chronic large hiatal hernia. Lungs are hyperinflated with flattening of the diaphragm consistent with emphysema. Pulmonary vascularity is normal. No infiltrates or effusions. Scarring at the right lung base laterally. Old rib deformities anterior laterally on the right. Severe arthritic changes at the right shoulder with deformity of the right scapula which is chronic. IMPRESSION: No acute  abnormalities. Chronic cardiomegaly. Emphysema. Large hiatal hernia. Electronically Signed   By: Lorriane Shire M.D.   On: 11/20/2014 21:03    EKG: Initial EKG revealed atrial fibrillation with a rapid ventricular rate  ASSESSMENT AND PLAN:   1. Paroxysmal atrial fibrillation, chads Vasc of 5, not an ideal candidate for chronic anticoagulation in light of patient's advanced age, dementia and risk of falling 2. Brief syncope, likely secondary to paroxysmal fibrillation 3. Borderline elevated troponin, likely demand supply ischemia due to atrial fibrillation with a rapid ventricular rate  Recommendations  1. Defer chronic anticoagulation 2. Agree tapering Cardizem drip to off and starting by mouth Cardizem 3. Start low-dose sotalol for maintenance of sinus rhythm 4. Further recommendations pending patient's clinical course  Signed: Keaghan Staton MD,PhD, [facility] 11/21/2014, 8:37 AM

## 2014-11-22 LAB — BASIC METABOLIC PANEL
ANION GAP: 5 (ref 5–15)
BUN: 23 mg/dL — ABNORMAL HIGH (ref 6–20)
CALCIUM: 9.1 mg/dL (ref 8.9–10.3)
CHLORIDE: 100 mmol/L — AB (ref 101–111)
CO2: 31 mmol/L (ref 22–32)
Creatinine, Ser: 0.61 mg/dL (ref 0.44–1.00)
GFR calc Af Amer: >60 mL/min (ref >60)
GFR calc non Af Amer: >60 mL/min (ref >60)
GLUCOSE: 121 mg/dL — AB (ref 65–99)
Potassium: 3.8 mmol/L (ref 3.5–5.1)
Sodium: 136 mmol/L (ref 135–145)

## 2014-11-22 LAB — GLUCOSE, CAPILLARY
GLUCOSE-CAPILLARY: 253 mg/dL — AB (ref 65–99)
GLUCOSE-CAPILLARY: 312 mg/dL — AB (ref 65–99)
GLUCOSE-CAPILLARY: 301 mg/dL — AB (ref 65–99)
Glucose-Capillary: 86 mg/dL (ref 65–99)

## 2014-11-22 LAB — CBC WITH DIFFERENTIAL/PLATELET
BASOS ABS: 0.1 10*3/uL (ref 0–0.1)
Basophils Relative: 1 %
Eosinophils Absolute: 0.1 10*3/uL (ref 0–0.7)
Eosinophils Relative: 1 %
HEMATOCRIT: 30.9 % — AB (ref 35.0–47.0)
HEMOGLOBIN: 10.4 g/dL — AB (ref 12.0–16.0)
LYMPHS PCT: 27 %
Lymphs Abs: 2.4 10*3/uL (ref 1.0–3.6)
MCH: 27.9 pg (ref 26.0–34.0)
MCHC: 33.8 g/dL (ref 32.0–36.0)
MCV: 82.6 fL (ref 80.0–100.0)
MONO ABS: 0.8 10*3/uL (ref 0.2–0.9)
MONOS PCT: 9 %
NEUTROS ABS: 5.6 10*3/uL (ref 1.4–6.5)
NEUTROS PCT: 62 %
Platelets: 300 10*3/uL (ref 150–440)
RBC: 3.74 MIL/uL — ABNORMAL LOW (ref 3.80–5.20)
RDW: 13 % (ref 11.5–14.5)
WBC: 8.8 10*3/uL (ref 3.6–11.0)

## 2014-11-22 LAB — URINE CULTURE: CULTURE: NO GROWTH

## 2014-11-22 MED ORDER — METOPROLOL TARTRATE 50 MG PO TABS: 50.0000 mg | ORAL_TABLET | Freq: Two times a day (BID) | ORAL | Status: AC

## 2014-11-22 MED ORDER — HEPARIN SODIUM (PORCINE) 5000 UNIT/ML IJ SOLN
5000.0000 [IU] | Freq: Two times a day (BID) | INTRAMUSCULAR | Status: DC
  Administered 2014-11-22 – 2014-11-23 (×2): 5000 [IU] via SUBCUTANEOUS
  Filled 2014-11-22 (×2): qty 1

## 2014-11-22 MED ORDER — INSULIN ASPART 100 UNIT/ML ~~LOC~~ SOLN
0.0000 [IU] | Freq: Three times a day (TID) | SUBCUTANEOUS | Status: DC
  Administered 2014-11-22: 3 [IU] via SUBCUTANEOUS
  Administered 2014-11-22: 7 [IU] via SUBCUTANEOUS
  Filled 2014-11-22: qty 7
  Filled 2014-11-22: qty 3

## 2014-11-22 MED ORDER — INSULIN DETEMIR 100 UNIT/ML ~~LOC~~ SOLN: 5.0000 [IU] | Freq: Every day | SUBCUTANEOUS | Status: DC

## 2014-11-22 MED ORDER — SOTALOL HCL 80 MG PO TABS: 40.0000 mg | ORAL_TABLET | Freq: Two times a day (BID) | ORAL | Status: AC

## 2014-11-22 MED ORDER — INSULIN DETEMIR 100 UNIT/ML ~~LOC~~ SOLN
10.0000 [IU] | Freq: Every morning | SUBCUTANEOUS | Status: DC
  Administered 2014-11-22 – 2014-11-23 (×2): 10 [IU] via SUBCUTANEOUS
  Filled 2014-11-22 (×2): qty 0.1

## 2014-11-22 MED ORDER — INSULIN DETEMIR 100 UNIT/ML ~~LOC~~ SOLN: 10.0000 [IU] | Freq: Every morning | SUBCUTANEOUS | Status: DC

## 2014-11-22 MED ORDER — METOPROLOL TARTRATE 50 MG PO TABS
50.0000 mg | ORAL_TABLET | Freq: Two times a day (BID) | ORAL | Status: DC
  Administered 2014-11-22 – 2014-11-23 (×3): 50 mg via ORAL
  Filled 2014-11-22 (×3): qty 1

## 2014-11-22 MED ORDER — DILTIAZEM HCL 30 MG PO TABS
30.0000 mg | ORAL_TABLET | Freq: Three times a day (TID) | ORAL | Status: DC
  Administered 2014-11-22: 30 mg via ORAL
  Filled 2014-11-22: qty 1

## 2014-11-22 NOTE — Progress Notes (Signed)
[facility] Cardiology  SUBJECTIVE: No chest pain   Filed Vitals:   11/21/14 1413 11/21/14 1947 11/22/14 0505 11/22/14 0757  BP: 136/50 142/93 141/58 147/58  Pulse: 65 70 63 64  Temp: 97.6 F (36.4 C) 98.2 F (36.8 C) 98.2 F (36.8 C) 98.1 F (36.7 C)  TempSrc: Oral Oral Oral Oral  Resp: 16 22 22 17   Height:      Weight:      SpO2: 100% 100% 98% 98%     Intake/Output Summary (Last 24 hours) at 11/22/14 0858 Last data filed at 11/22/14 0758  Gross per 24 hour  Intake    649 ml  Output   1200 ml  Net   -551 ml      PHYSICAL EXAM  General: Well developed, well nourished, in no acute distress HEENT:  Normocephalic and atramatic Neck:  No JVD.  Lungs: Clear bilaterally to auscultation and percussion. Heart: HRRR . Normal S1 and S2 without gallops or murmurs.  Abdomen: Bowel sounds are positive, abdomen soft and non-tender  Msk:  Back normal, normal gait. Normal strength and tone for age. Extremities: No clubbing, cyanosis or edema.   Neuro: Alert and oriented X 3. Psych:  Good affect, responds appropriately   LABS: Basic Metabolic Panel:  Recent Labs  11/20/14 2009 11/22/14 0446  NA 134* 136  K 3.8 3.8  CL 95* 100*  CO2 29 31  GLUCOSE 207* 121*  BUN 30* 23*  CREATININE 0.81 0.61  CALCIUM 9.4 9.1   Liver Function Tests:  Recent Labs  11/20/14 2009  AST 23  ALT 18  ALKPHOS 86  BILITOT 0.7  PROT 7.0  ALBUMIN 3.3*    Recent Labs  11/20/14 2009  LIPASE 50   CBC:  Recent Labs  11/20/14 2009 11/22/14 0446  WBC 12.1* 8.8  NEUTROABS 8.1* 5.6  HGB 12.6 10.4*  HCT 37.6 30.9*  MCV 82.2 82.6  PLT 379 300   Cardiac Enzymes:  Recent Labs  11/21/14 0057 11/21/14 0621 11/21/14 1333  TROPONINI 0.09* 0.12* 0.10*   BNP: Invalid input(s): POCBNP D-Dimer: No results for input(s): DDIMER in the last 72 hours. Hemoglobin A1C: No results for input(s): HGBA1C in the last 72 hours. Fasting Lipid Panel: No results for input(s): CHOL, HDL, LDLCALC,  TRIG, CHOLHDL, LDLDIRECT in the last 72 hours. Thyroid Function Tests:  Recent Labs  11/20/14 2009  TSH 7.616*   Anemia Panel: No results for input(s): VITAMINB12, FOLATE, FERRITIN, TIBC, IRON, RETICCTPCT in the last 72 hours.  Dg Chest Port 1 View  11/20/2014  CLINICAL DATA:  Tachycardia.  Nausea and vomiting.  Near syncope. EXAM: PORTABLE CHEST 1 VIEW COMPARISON:  Chest x-rays dated 10/07/2014, 08/08/2014 and 08/02/2014 FINDINGS: There is chronic cardiomegaly with a chronic large hiatal hernia. Lungs are hyperinflated with flattening of the diaphragm consistent with emphysema. Pulmonary vascularity is normal. No infiltrates or effusions. Scarring at the right lung base laterally. Old rib deformities anterior laterally on the right. Severe arthritic changes at the right shoulder with deformity of the right scapula which is chronic. IMPRESSION: No acute abnormalities. Chronic cardiomegaly. Emphysema. Large hiatal hernia. Electronically Signed   By: Lorriane Shire M.D.   On: 11/20/2014 21:03     Echo   TELEMETRY: Normal sinus rhythm at 65 bpm:  ASSESSMENT AND PLAN:  Principal Problem:   Atrial fibrillation, rapid ([place])    1. Paroxysmal atrial fibrillation, chads Vasc of 5, not an ideal candidate for chronic anticoagulation due to advanced age, dementia, risk  of falling. Patient remains in sinus rhythm, on metoprolol, sotalol and low-dose Cardizem. 2. Syncope, likely due to atrial fibrillation with a rapid ventricular rate  Recommendations  1. Defer chronic anticoagulation 2. DC Cardizem 3. Continue metoprolol and low-dose sotalol    Amanda Truex, MD, PhD, [facility] 11/22/2014 8:58 AM

## 2014-11-22 NOTE — Progress Notes (Signed)
Patient ID: Amanda Stark, female   DOB: 05-16-1922, 81 y.o.   MRN: [id number] Amanda Stark is a 81 y.o. female   SUBJECTIVE:  Patient admitted with rapid A. fib and syncope with unresponsiveness for several minutes. Moderate confusion yesterday afternoon, sleeping this morning. Staying in normal sinus rhythm ______________________________________________________________________  ROS: Review of systems is unremarkable for any active cardiac,respiratory, GI, GU, hematologic, neurologic or psychiatric systems, 10 systems reviewed.  Amanda Stark antiseptic oral rinse  7 mL Mouth Rinse BID  . diltiazem  60 mg Oral 3 times per day  . docusate sodium  100 mg Oral BID  . heparin  5,000 Units Subcutaneous 3 times per day  . insulin aspart  0-5 Units Subcutaneous QHS  . insulin aspart  0-9 Units Subcutaneous TID WC  . insulin detemir  10 Units Subcutaneous q morning - 10a  . insulin detemir  5 Units Subcutaneous QHS  . levothyroxine  125 mcg Oral QAC breakfast  . LORazepam  1 mg Oral QHS  . metoprolol tartrate  50 mg Oral BID  . multivitamin with minerals  1 tablet Oral Daily  . pantoprazole  40 mg Oral Daily  . polyethylene glycol  17 g Oral Daily  . predniSONE  7.5 mg Oral Daily  . sodium chloride  3 mL Intravenous Q12H  . sotalol  40 mg Oral Q12H   acetaminophen **OR** acetaminophen, hydrALAZINE, ondansetron **OR** ondansetron (ZOFRAN) IV   Past Medical History  Diagnosis Date  . SVT (supraventricular tachycardia) ([place])   . Thyroid disease   . Instability of right shoulder joint   . Glenohumeral arthritis   . Diabetes mellitus without complication ([place])   . Hypertension   . Hyperlipidemia   . PMR (polymyalgia rheumatica) ([facility])   . Lumbar spinal stenosis   . Osteoporosis   . Thyroid cancer (Amanda Stark)   . Esophageal cancer (Amanda Stark)   . Vertigo     Past Surgical History  Procedure Laterality Date  . Total thyroidectomy    . Kidney stone removal    . Right lens implant    . Appendectomy     . Abdominal hysterectomy    . Thyroidectomy    . Back surgery      PHYSICAL EXAM:  BP 141/58 mmHg  Pulse 63  Temp(Src) 98.2 F (36.8 C) (Oral)  Resp 22  Ht 5\' 5"  (1.651 m)  Wt 49.896 kg (110 lb)  BMI 18.31 kg/m2  SpO2 98%  Wt Readings from Last 3 Encounters:  11/20/14 49.896 kg (110 lb)  10/07/14 48.535 kg (107 lb)  09/14/14 48.716 kg (107 lb 6.4 oz)           BP Readings from Last 3 Encounters:  11/22/14 141/58  10/07/14 123/93  09/14/14 167/72    Constitutional: NAD Neck: supple, no thyromegaly Respiratory: CTA, no rales or wheezes Cardiovascular: RRR, no murmur, no gallop Abdomen: soft, good BS, nontender Extremities: no edema Neuro: Sleeping this morning  ASSESSMENT/PLAN:  Labs and imaging studies were reviewed  Rapid A. fib-back in normal sinus rhythm, appreciate cardiology thoughts, sotalol added, reduce metoprolol to avoid hypotension  Syncope-likely related A. fib, was down for a few seconds, watch for signs of anoxic encephalopathy Lumbar spinal stenosis-ambulation limited Diabetes mellitus, insulin requiring-p.m. sugars have been elevated, has been on Levemir 10 units a.m., 5 units p.m., with hypoglycemia, drop Levemir back to 10 units every morning  Chronic right shoulder dysfunction-follow Back to assisted living tomorrow if stable, mentally back to baseline

## 2014-11-22 NOTE — Care Management Important Message (Signed)
Important Message  Patient Details  Name: Amanda Stark MRN: [id number] Date of Birth: 1922/06/09   Medicare Important Message Given:  Yes-second notification given    Darius Bump Allmond 11/22/2014, 9:46 AM

## 2014-11-22 NOTE — Plan of Care (Signed)
Problem: Phase I Progression Outcomes Goal: OOB as tolerated unless otherwise ordered Outcome: Progressing PT eval ordered

## 2014-11-22 NOTE — Evaluation (Signed)
Physical Therapy Evaluation Patient Details Name: Amanda Stark MRN: [id number] DOB: [birth year] Today's Date: 11/22/2014   History of Present Illness  Pt is a pleasant but mildly confused 81 year-old Caucasian female who arrived after syncopal episode at ALF and was admitted for syncope secondary to a-fib with RVR. PMH: dementia, SVT, thyroid disease, R shoulder instability, DM, HTN, hyperlipidema, lumbar spine stenosis, and osteoporosis. At baseline pt requires assist for ADLs from staff at [facility]. Her history is somewhat conflicting at times and no family present to clarify. Friend is present who can provide some limited information. Pt reports at least 5 falls in the last 12 months.   Clinical Impression  Pt demonstrates distracted thinking and poor ability to remain on task during evaluation. She does not follow commands well due to being distracted. Overall her mobility appears to be baseline with some poor safety awareness during ambulation but no overt LOB. Pt with history of multiple falls over the last 12 months and will benefit from [facility] PT at ALF to reduce fall risk and try to prevent readmission. Pt will benefit from skilled PT services to address deficits in strength, balance, and mobility in order to return to full function at home.     Follow Up Recommendations Home health PT (Return to ALF with [facility] PT)    Equipment Recommendations  None recommended by PT    Recommendations for Other Services       Precautions / Restrictions Precautions Precautions: Fall Restrictions Weight Bearing Restrictions: No      Mobility  Bed Mobility Overal bed mobility: Needs Assistance Bed Mobility: Supine to Sit;Sit to Supine     Supine to sit: Supervision Sit to supine: Supervision   General bed mobility comments: HOB elevated and use of bed rails. Takes extended time to complete  Transfers Overall transfer level: Needs assistance Equipment used: Rolling walker (2 wheeled) Transfers:  Sit to/from Stand Sit to Stand: Min guard         General transfer comment: Cues for safe hand placement. Overall pt is reasonably steady withotu overt LOB. She remains in crouched posture in standing with forward trunk flexion and bilateral knee flexion. Poor command follow  Ambulation/Gait Ambulation/Gait assistance: Min guard Ambulation Distance (Feet): 80 Feet Assistive device: Rolling walker (2 wheeled) Gait Pattern/deviations: Decreased step length - right;Decreased step length - left;Shuffle   Gait velocity interpretation: <1.8 ft/sec, indicative of risk for recurrent falls General Gait Details: Pt with short steps and decreased gait speed. She walks with crouched posture and bilateral knee flexion. Poor scanning of environment and poor safety with turns requiring cues to stay within confines of walker. No overt LOB during ambulation and friend reports that gait quality is baseline for patient  Stairs            Wheelchair Mobility    Modified Rankin (Stroke Patients Only)       Balance Overall balance assessment: Needs assistance   Sitting balance-[name]: Fair       Standing balance-[name]: Fair Standing balance comment: Unable to achieve narrow stance due to poor balance.                              Pertinent Vitals/Pain Pain Assessment: No/denies pain    Home Living Family/patient expects to be discharged to:: Assisted living               Home Equipment: Walker - 4 wheels Additional  Comments: Pt lives at [facility]F    Prior Function Level of Independence: Needs assistance   Gait / Transfers Assistance Needed: Uses rollator  ADL's / Homemaking Assistance Needed: Intermittent assist with bathing/dressing        Hand Dominance        Extremity/Trunk Assessment   Upper Extremity Assessment: RUE deficits/detail;Generalized weakness RUE Deficits / Details: Limited R shoulder flexion to approximately 80  degrees. Unable to tolerate resistance. LUE strength is grossly 4-/5 throughout         Lower Extremity Assessment: Generalized weakness (Grossly 4-/5 throughout without focal weakness)         Communication   Communication: No difficulties  Cognition Arousal/Alertness: Awake/alert Behavior During Therapy: WFL for tasks assessed/performed Overall Cognitive Status: History of cognitive impairments - at baseline (No family present. Currently AOx2, conversation tangental)                      General Comments      Exercises        Assessment/Plan    PT Assessment Patient needs continued PT services  PT Diagnosis Abnormality of gait;Difficulty walking;Generalized weakness   PT Problem List Decreased strength;Decreased activity tolerance;Decreased balance;Decreased mobility;Decreased cognition;Decreased knowledge of use of DME;Decreased safety awareness  PT Treatment Interventions DME instruction;Gait training;Functional mobility training;Therapeutic activities;Therapeutic exercise;Balance training;Neuromuscular re-education;Cognitive remediation   PT Goals (Current goals can be found in the Care Plan section) Acute Rehab PT Goals Patient Stated Goal: "I want to go back to [facility]" PT Goal Formulation: With patient Time For Goal Achievement: 12/06/14 Potential to Achieve Goals: Good    Frequency Min 2X/week   Barriers to discharge        Co-evaluation               End of Session Equipment Utilized During Treatment: Gait belt Activity Tolerance: Patient tolerated treatment well Patient left: in bed;with call bell/phone within reach;with bed alarm set;with family/visitor present           Time: 1435-1455 PT Time Calculation (min) (ACUTE ONLY): 20 min   Charges:   PT Evaluation $Initial PT Evaluation Tier I: 1 Procedure     PT G Codes:       Lyndel Safe Lancer Thurner PT, DPT   Blaire Palomino 11/22/2014, 4:16 PM

## 2014-11-22 NOTE — Care Management (Signed)
patient presents from [facility] assisted living.  It is anticipated that she will return at discharge with home health nursing and physical therapy

## 2014-11-22 NOTE — Progress Notes (Signed)
Plan is for patient to return to [facility]F likely tomorrow per MD note. Clinical Education officer, museum (CSW) contacted Psychologist, prison and probation services at [facility] and made her aware of above. Per Hor[place] Latino patient can return tomorrow. CSW will fax D/C Summary to [phone]. CSW will continue to follow and assist as needed.   Blima Rich, [place] [phone]

## 2014-11-23 LAB — GLUCOSE, CAPILLARY: Glucose-Capillary: 117 mg/dL — ABNORMAL HIGH (ref 65–99)

## 2014-11-23 NOTE — Care Management (Signed)
Patient is for discharge back to [facility] today with resumption of home health nursing and physical therapy.  Patient was currently followed by [facility].  Notified  agency and faxed information

## 2014-11-23 NOTE — Progress Notes (Signed)
Clinical Social Worker informed by Emily Filbert, MD that patient is medically ready to discharge to [facility] with Home Health services.   Patient and family are in a agreement with plan.  Call to [facility] to [facility] to confirm that patient's bed is ready.  Patient's son's and daughter in-law are at bedside and will transport patient back the [facility].  Patient is excited and eager to return to her Assisted Living.  CSW updated FL2 and faxed discharge summary .  Discharge packet completed for family to take with patient.  Casimer Lanius. Latanya Presser, MSW Clinical Social Work Department [phone] 11:14 AM

## 2014-11-23 NOTE — Discharge Summary (Addendum)
Amanda Stark, is a 81 y.o. female  DOB January 17, 1923  MRN [id number].  Admission date:  11/20/2014  Admitting Physician  Lytle Butte, MD  Discharge Date:  11/23/2014    Admission Diagnosis  Syncope and collapse [R55] Paroxysmal atrial fibrillation (HCC) [I48.0] Tachycardia [R00.0]  Discharge Diagnoses    Syncope and hypotension, due to rapid atrial fibrillation Diabetes mellitus, insulin requiring, complicated by hypoglycemia Hypothyroid Nonunion right shoulder Lumbar spinal stenosis Polymyalgia rheumatica Osteoporosis Dementia, mixed type, mild  Past Medical History  Diagnosis Date  . SVT (supraventricular tachycardia) ([place])   . Thyroid disease   . Instability of right shoulder joint   . Glenohumeral arthritis   . Diabetes mellitus without complication ([place])   . Hypertension   . Hyperlipidemia   . PMR (polymyalgia rheumatica) (HCC)   . Lumbar spinal stenosis   . Osteoporosis   . Thyroid cancer ([place])   . Esophageal cancer ([place])   . Vertigo     Past Surgical History  Procedure Laterality Date  . Total thyroidectomy    . Kidney stone removal    . Right lens implant    . Appendectomy    . Abdominal hysterectomy    . Thyroidectomy    . Back surgery         History of present illness and  Hospital Course:     Kindly see H&P for history of present illness and admission details, please review complete Labs, Consult reports and Test reports for all details in brief  HPI  from the history and physical done on the day of admission    Hospital Course    Patient was admitted with rapid A. fib and placed on IV diltiazem. Her heart rate came back to baseline in normal sinus rhythm. Due to associated hypotension sotalol was added and her heart rate was maintained with lower dose metoprolol, off diltiazem, with the sotalol 40 mg twice a day. Cardiac enzymes were normal. Patient had one  altered mental status episode related to hypoglycemia and her insulin was dropped back down to the discharge dosing. Overall prognosis is guarded with her multiple medical problems and frailty. We'll go back to [facility] assisted living   Discharge Condition: Stable   Follow UP  Dr. Sabra Heck 5 days    Discharge Instructions  and  Discharge Medications   Calcium 600 mg twice a day Colace 100 mg twice a day Levemir insulin 10 units subcutaneous every morning, 5 units subcutaneous daily at bedtime Synthroid 125 micrograms daily Ativan 1 mg at bedtime Metoprolol tartrate 50 mg twice a day Multivitamin daily Omeprazole 20 mg daily MiraLAX 17 g daily Prednisone 7.5 mg daily Sotalol 40 mg twice a day Vitamin D3 5000 units daily Vitron C iron tab daily   Today   Subjective:   Amanda Stark today has no headache,no chest abdominal pain,no new weakness tingling or numbness, feels much better wants to go to asst living today.  Objective:   Blood pressure 168/59, pulse 65, temperature 97.8 F (  36.6 C), temperature source Oral, resp. rate 15, height 5\' 5"  (1.651 m), weight 49.896 kg (110 lb), SpO2 98 %.   Exam Awake Alert, Oriented x 3, No new F.N deficits, Normal affect [place].AT,PERRAL Supple Neck,No JVD, No cervical lymphadenopathy appriciated.  Symmetrical Chest wall movement, Good air movement bilaterally, CTAB RRR,No Gallops,Rubs or new Murmurs, Abd Soft, Non tender, No rebound. No Cyanosis, Clubbing or edema, No new Rash or bruise  Total Time in preparing paper work, data evaluation and todays exam - 35 minutes  MILLER,MARK F. M.D on 11/23/2014 at 7:59 AM

## 2014-11-26 LAB — CULTURE, BLOOD (ROUTINE X 2)
CULTURE: NO GROWTH
Culture: NO GROWTH

## 2015-01-29 ENCOUNTER — Emergency Department

## 2015-01-29 ENCOUNTER — Encounter: Payer: Self-pay | Admitting: Emergency Medicine

## 2015-01-29 ENCOUNTER — Emergency Department
Admission: EM | Admit: 2015-01-29 | Discharge: 2015-01-29 | Disposition: A | Discharge status: Home / Self Care | Attending: Emergency Medicine | Admitting: Emergency Medicine

## 2015-01-29 DIAGNOSIS — Z794 Long term (current) use of insulin: Secondary | ICD-10-CM | POA: Insufficient documentation

## 2015-01-29 DIAGNOSIS — I10 Essential (primary) hypertension: Secondary | ICD-10-CM | POA: Insufficient documentation

## 2015-01-29 DIAGNOSIS — Z7952 Long term (current) use of systemic steroids: Secondary | ICD-10-CM | POA: Insufficient documentation

## 2015-01-29 DIAGNOSIS — E162 Hypoglycemia, unspecified: Secondary | ICD-10-CM

## 2015-01-29 DIAGNOSIS — Z791 Long term (current) use of non-steroidal anti-inflammatories (NSAID): Secondary | ICD-10-CM | POA: Insufficient documentation

## 2015-01-29 DIAGNOSIS — Z79899 Other long term (current) drug therapy: Secondary | ICD-10-CM | POA: Diagnosis not present

## 2015-01-29 DIAGNOSIS — E11649 Type 2 diabetes mellitus with hypoglycemia without coma: Secondary | ICD-10-CM | POA: Diagnosis present

## 2015-01-29 LAB — URINALYSIS COMPLETE WITH MICROSCOPIC (ARMC ONLY)
BACTERIA UA: NONE SEEN
BILIRUBIN URINE: NEGATIVE
GLUCOSE, UA: 50 mg/dL — AB
Ketones, ur: NEGATIVE mg/dL
LEUKOCYTES UA: NEGATIVE
NITRITE: POSITIVE — AB
PH: 7 (ref 5.0–8.0)
Protein, ur: 30 mg/dL — AB
SPECIFIC GRAVITY, URINE: 1.014 (ref 1.005–1.030)
SQUAMOUS EPITHELIAL / LPF: NONE SEEN

## 2015-01-29 LAB — COMPREHENSIVE METABOLIC PANEL
ALBUMIN: 3.4 g/dL — AB (ref 3.5–5.0)
ALT: 20 U/L (ref 14–54)
ANION GAP: 7 (ref 5–15)
AST: 23 U/L (ref 15–41)
Alkaline Phosphatase: 62 U/L (ref 38–126)
BILIRUBIN TOTAL: 0.6 mg/dL (ref 0.3–1.2)
BUN: 29 mg/dL — AB (ref 6–20)
CHLORIDE: 93 mmol/L — AB (ref 101–111)
CO2: 34 mmol/L — ABNORMAL HIGH (ref 22–32)
Calcium: 9.8 mg/dL (ref 8.9–10.3)
Creatinine, Ser: 0.76 mg/dL (ref 0.44–1.00)
GFR calc Af Amer: >60 mL/min (ref >60)
GFR calc non Af Amer: >60 mL/min (ref >60)
GLUCOSE: 170 mg/dL — AB (ref 65–99)
POTASSIUM: 3.9 mmol/L (ref 3.5–5.1)
Sodium: 134 mmol/L — ABNORMAL LOW (ref 135–145)
TOTAL PROTEIN: 7.6 g/dL (ref 6.5–8.1)

## 2015-01-29 LAB — CBC WITH DIFFERENTIAL/PLATELET
BASOS ABS: 0.1 10*3/uL (ref 0–0.1)
BASOS PCT: 1 %
EOS ABS: 0.1 10*3/uL (ref 0–0.7)
EOS PCT: 1 %
HCT: 35 % (ref 35.0–47.0)
Hemoglobin: 11.7 g/dL — ABNORMAL LOW (ref 12.0–16.0)
Lymphocytes Relative: 14 %
Lymphs Abs: 1.9 10*3/uL (ref 1.0–3.6)
MCH: 27.1 pg (ref 26.0–34.0)
MCHC: 33.4 g/dL (ref 32.0–36.0)
MCV: 81.1 fL (ref 80.0–100.0)
MONO ABS: 1.5 10*3/uL — AB (ref 0.2–0.9)
MONOS PCT: 11 %
NEUTROS ABS: 10.5 10*3/uL — AB (ref 1.4–6.5)
Neutrophils Relative %: 75 %
PLATELETS: 345 10*3/uL (ref 150–440)
RBC: 4.31 MIL/uL (ref 3.80–5.20)
RDW: 14.1 % (ref 11.5–14.5)
WBC: 14.1 10*3/uL — ABNORMAL HIGH (ref 3.6–11.0)

## 2015-01-29 LAB — GLUCOSE, CAPILLARY
Glucose-Capillary: 140 mg/dL — ABNORMAL HIGH (ref 65–99)
Glucose-Capillary: 141 mg/dL — ABNORMAL HIGH (ref 65–99)

## 2015-01-29 LAB — TROPONIN I

## 2015-01-29 NOTE — Discharge Instructions (Signed)

## 2015-01-29 NOTE — ED Notes (Signed)
Pt and family verbalized understanding of discharge instructions. NAD at this time.  

## 2015-01-29 NOTE — ED Notes (Signed)
Son contacted [facility] and staff stated that Pt ate breakfast to include bacon and eggs.

## 2015-01-29 NOTE — ED Notes (Signed)
Pt arrived by EMS after staff at [facility] stated pt's CBG was 39 and after giving orange juice CBG was 41 and she briefly lost consciousness while sitting. EMS administered an amp of D50 in route and gave the pt crackers. CBG checked upon arrival and 140.

## 2015-01-29 NOTE — ED Provider Notes (Signed)
Time Seen: Approximately 1049  I have reviewed the triage notes  Chief Complaint: Hypoglycemia   History of Present Illness: Amanda Stark is a 82 y.o. female who has a history of diabetes. Patient's currently on insulin at the nursing facility. [facility]. Patient apparently had episode of hypoglycemia. Patient's blood sugar was 39 and then 41 on outpatient testing. Patient received an amp of D50 and rechecked by EMS was at 371. Patient denies any physical complaints at this time such as chest pain, abdominal pain, urinary frequency, etc. There apparently has not been any recent adjustments on her insulin according to the nursing staff. The patient's son arrived later and she seems to be at her baseline mental status and answers most questions appropriately. Patient apparently has had an upper respiratory tract infection. She did eat breakfast develop normally consisted of eggs and bacon with no carbohydrate consumption.   Past Medical History  Diagnosis Date  . SVT (supraventricular tachycardia) ([place])   . Thyroid disease   . Instability of right shoulder joint   . Glenohumeral arthritis   . Diabetes mellitus without complication ([place])   . Hypertension   . Hyperlipidemia   . PMR (polymyalgia rheumatica) (HCC)   . Lumbar spinal stenosis   . Osteoporosis   . Thyroid cancer ([place])   . Esophageal cancer ([place])   . Vertigo     Patient Active Problem List   Diagnosis Date Noted  . Atrial fibrillation, rapid ([place]) 11/20/2014  . SVT (supraventricular tachycardia) ([place]) 08/02/2014  . Hyponatremia 08/02/2014    Past Surgical History  Procedure Laterality Date  . Total thyroidectomy    . Kidney stone removal    . Right lens implant    . Appendectomy    . Abdominal hysterectomy    . Thyroidectomy    . Back surgery      Past Surgical History  Procedure Laterality Date  . Total thyroidectomy    . Kidney stone removal    . Right lens implant    .  Appendectomy    . Abdominal hysterectomy    . Thyroidectomy    . Back surgery      Current Outpatient Rx  Name  Route  Sig  Dispense  Refill  . acetaminophen (TYLENOL) 325 MG tablet   Oral   Take 650 mg by mouth 3 (three) times daily.         . Calcium Carb-Cholecalciferol (CALCIUM 600+D3 PO)   Oral   Take 1 tablet by mouth 2 (two) times daily.         . Cholecalciferol (VITAMIN D3) 5000 UNITS TABS   Oral   Take 5,000 Units by mouth daily.         [name] donepezil (ARICEPT) 5 MG tablet   Oral   Take 5 mg by mouth at bedtime.         [name] guaifenesin (ROBITUSSIN) 100 MG/5ML syrup   Oral   Take 200 mg by mouth every 4 (four) hours as needed for cough or congestion.         . insulin detemir (LEVEMIR) 100 UNIT/ML injection   Subcutaneous   Inject 0.1 mLs (10 Units total) into the skin every morning.   10 mL   11   . insulin detemir (LEVEMIR) 100 UNIT/ML injection   Subcutaneous   Inject 0.05 mLs (5 Units total) into the skin at bedtime.   10 mL   11   . insulin  lispro (HUMALOG KWIKPEN) 100 UNIT/ML KiwkPen   Subcutaneous   Inject 4 Units into the skin daily with lunch.         . Iron-Vitamin C (VITRON-C) 65-125 MG TABS   Oral   Take 1 tablet by mouth daily at 12 noon.          [name] levothyroxine (SYNTHROID, LEVOTHROID) 125 MCG tablet   Oral   Take 125 mcg by mouth daily before breakfast.         . lidocaine (LIDODERM) 5 %   Transdermal   Place 1 patch onto the skin every morning. Apply to right shoulder and back. *Remove & Discard patch within 12 hours or as directed by MD*         . LORazepam (ATIVAN) 1 MG tablet   Oral   Take 1 mg by mouth at bedtime.         . meclizine (ANTIVERT) 25 MG tablet   Oral   Take 25 mg by mouth 3 (three) times daily as needed for nausea.         . meloxicam (MOBIC) 15 MG tablet   Oral   Take 15 mg by mouth daily.         . metoprolol (LOPRESSOR) 50 MG tablet   Oral   Take 1 tablet (50 mg total) by mouth 2  (two) times daily.   1 tablet   1   . Multiple Vitamins-Minerals (CENTRUM SILVER PO)   Oral   Take 1 tablet by mouth daily.         [name] omeprazole (PRILOSEC) 20 MG capsule   Oral   Take 20 mg by mouth daily.         . polyethylene glycol (MIRALAX / GLYCOLAX) packet   Oral   Take 17 g by mouth daily as needed for mild constipation or moderate constipation. Mix with 8 oz of water.         . predniSONE (DELTASONE) 5 MG tablet   Oral   Take 7.5 mg by mouth daily.         . sotalol (BETAPACE) 80 MG tablet   Oral   Take 0.5 tablets (40 mg total) by mouth every 12 (twelve) hours.   1 tablet   1     Allergies:  Ascriptin a-d; Aspirin; Codeine; Hydrocodone; Statins; and Tramadol  Family History: Family History  Problem Relation Age of Onset  . Cancer Mother   . Cancer Sister     Social History: Social History  Substance Use Topics  . Smoking status: Never Smoker   . Smokeless tobacco: Never Used  . Alcohol Use: No     Review of Systems:   10 point review of systems was performed and was otherwise negative:  Constitutional: No fever Eyes: No visual disturbances ENT: No sore throat, ear pain Cardiac: No chest pain Respiratory: No shortness of breath, wheezing, or stridor Abdomen: No abdominal pain, no vomiting, No diarrhea Endocrine: No weight loss, No night sweats Extremities: No peripheral edema, cyanosis Skin: No rashes, easy bruising Neurologic: No focal weakness, trouble with speech or swollowing Urologic: No dysuria, Hematuria, or urinary frequency   Physical Exam:  ED Triage Vitals  Enc Vitals Group     BP 01/29/15 1054 155/108 mmHg     Pulse Rate 01/29/15 1054 70     Resp 01/29/15 1054 16     Temp --      Temp src --      SpO2  01/29/15 1039 100 %     Weight 01/29/15 1054 109 lb (49.442 kg)     Height 01/29/15 1054 5\' 4"  (1.626 m)     Head Cir --      Peak Flow --      Pain Score --      Pain Loc --      Pain Edu? --      Excl. in [place]?  --     General: Awake , Alert , and Oriented times 3; GCS 15 Head: Normal cephalic , atraumatic Eyes: Pupils equal , round, reactive to light Nose/Throat: No nasal drainage, patent upper airway without erythema or exudate.  Neck: Supple, Full range of motion, No anterior adenopathy or palpable thyroid masses Lungs: Clear to ascultation without wheezes , rhonchi, or rales Heart: Regular rate, regular rhythm without murmurs , gallops , or rubs Abdomen: Soft, non tender without rebound, guarding , or rigidity; bowel sounds positive and symmetric in all 4 quadrants. No organomegaly .        Extremities: 2 plus symmetric pulses. No edema, clubbing or cyanosis Neurologic: normal ambulation, Motor symmetric without deficits, sensory intact Skin: warm, dry, no rashes   Labs:   All laboratory work was reviewed including any pertinent negatives or positives listed below:  Labs Reviewed  CBC WITH DIFFERENTIAL/PLATELET - Abnormal; Notable for the following:    WBC 14.1 (*)    Hemoglobin 11.7 (*)    Neutro Abs 10.5 (*)    Monocytes Absolute 1.5 (*)    All other components within normal limits  COMPREHENSIVE METABOLIC PANEL - Abnormal; Notable for the following:    Sodium 134 (*)    Chloride 93 (*)    CO2 34 (*)    Glucose, Bld 170 (*)    BUN 29 (*)    Albumin 3.4 (*)    All other components within normal limits  URINALYSIS COMPLETEWITH MICROSCOPIC ([facility] ONLY) - Abnormal; Notable for the following:    Color, Urine YELLOW (*)    APPearance HAZY (*)    Glucose, UA 50 (*)    Hgb urine dipstick 1+ (*)    Protein, ur 30 (*)    Nitrite POSITIVE (*)    All other components within normal limits  GLUCOSE, CAPILLARY - Abnormal; Notable for the following:    Glucose-Capillary 140 (*)    All other components within normal limits  GLUCOSE, CAPILLARY - Abnormal; Notable for the following:    Glucose-Capillary 141 (*)    All other components within normal limits  TROPONIN I    EKG:  ED ECG  REPORT I, Daymon Larsen, the attending physician, personally viewed and interpreted this ECG.  Date: 01/29/2015 EKG Time: 1108 Rate: 69 Rhythm: normal sinus rhythm QRS Axis: normal Intervals: normal ST/T Wave abnormalities: normal Conduction Disutrbances: none Narrative Interpretation: unremarkable Left anterior fascicular blockEXAM: CHEST 2 VIEW  COMPARISON: 11/20/2014  FINDINGS: Chronic right-sided rib deformities are stable. Mild cardiomegaly. Large hiatal hernia is unchanged. No new consolidation or mass. Hyperaeration.  IMPRESSION: No active cardiopulmonary disease.  Mild left ventricular hypertrophy  Radiology: *     I personally reviewed the radiologic studies    ED Course:  Patient's stay here was uneventful and serial blood sugars remain within normal limits. She was given crackers with peanut butter and the patient seemed to stabilize and the patient and her son feel comfortable with outpatient management. She did not ingest anything carbohydrates for breakfast and is followed at the assisted living  area advised him to contact Drs. on call to evaluate her insulin to see if they would like to do any adjustments. She does not appear to have any signs of community-acquired pneumonia or medical reasons why her blood sugar would decrease.    Assessment: * Hypoglycemia Insulin-dependent diabetic  Final Clinical Impression:   Final diagnoses:  Hypoglycemia     Plan:  Outpatient management Patient was advised to return immediately if condition worsens. Patient was advised to follow up with their primary care physician or other specialized physicians involved in their outpatient care            Daymon Larsen, MD 01/29/15 1544

## 2015-02-25 ENCOUNTER — Emergency Department

## 2015-02-25 ENCOUNTER — Emergency Department
Admission: EM | Admit: 2015-02-25 | Discharge: 2015-02-25 | Disposition: A | Discharge status: Home / Self Care | Attending: Emergency Medicine | Admitting: Emergency Medicine

## 2015-02-25 DIAGNOSIS — E871 Hypo-osmolality and hyponatremia: Secondary | ICD-10-CM | POA: Diagnosis not present

## 2015-02-25 DIAGNOSIS — Z79899 Other long term (current) drug therapy: Secondary | ICD-10-CM | POA: Diagnosis not present

## 2015-02-25 DIAGNOSIS — Z791 Long term (current) use of non-steroidal anti-inflammatories (NSAID): Secondary | ICD-10-CM | POA: Insufficient documentation

## 2015-02-25 DIAGNOSIS — X58XXXA Exposure to other specified factors, initial encounter: Secondary | ICD-10-CM | POA: Diagnosis not present

## 2015-02-25 DIAGNOSIS — Z794 Long term (current) use of insulin: Secondary | ICD-10-CM | POA: Insufficient documentation

## 2015-02-25 DIAGNOSIS — E119 Type 2 diabetes mellitus without complications: Secondary | ICD-10-CM | POA: Insufficient documentation

## 2015-02-25 DIAGNOSIS — Y92129 Unspecified place in nursing home as the place of occurrence of the external cause: Secondary | ICD-10-CM | POA: Insufficient documentation

## 2015-02-25 DIAGNOSIS — I10 Essential (primary) hypertension: Secondary | ICD-10-CM | POA: Insufficient documentation

## 2015-02-25 DIAGNOSIS — Y9389 Activity, other specified: Secondary | ICD-10-CM | POA: Diagnosis not present

## 2015-02-25 DIAGNOSIS — Y998 Other external cause status: Secondary | ICD-10-CM | POA: Insufficient documentation

## 2015-02-25 DIAGNOSIS — S81812A Laceration without foreign body, left lower leg, initial encounter: Secondary | ICD-10-CM | POA: Insufficient documentation

## 2015-02-25 DIAGNOSIS — Z7952 Long term (current) use of systemic steroids: Secondary | ICD-10-CM | POA: Insufficient documentation

## 2015-02-25 LAB — URINALYSIS COMPLETE WITH MICROSCOPIC (ARMC ONLY)
BACTERIA UA: NONE SEEN
Bilirubin Urine: NEGATIVE
Glucose, UA: >500 mg/dL — AB
Hgb urine dipstick: NEGATIVE
Ketones, ur: NEGATIVE mg/dL
Leukocytes, UA: NEGATIVE
Nitrite: NEGATIVE
PH: 7 (ref 5.0–8.0)
PROTEIN: 30 mg/dL — AB
SQUAMOUS EPITHELIAL / LPF: NONE SEEN
Specific Gravity, Urine: 1.009 (ref 1.005–1.030)

## 2015-02-25 LAB — CBC WITH DIFFERENTIAL/PLATELET
Basophils Absolute: 0 10*3/uL (ref 0–0.1)
Basophils Relative: 0 %
Eosinophils Absolute: 0 10*3/uL (ref 0–0.7)
Eosinophils Relative: 0 %
HCT: 34.4 % — ABNORMAL LOW (ref 35.0–47.0)
HEMOGLOBIN: 11.4 g/dL — AB (ref 12.0–16.0)
LYMPHS ABS: 0.8 10*3/uL — AB (ref 1.0–3.6)
LYMPHS PCT: 6 %
MCH: 27.4 pg (ref 26.0–34.0)
MCHC: 33.1 g/dL (ref 32.0–36.0)
MCV: 82.7 fL (ref 80.0–100.0)
MONOS PCT: 3 %
Monocytes Absolute: 0.4 10*3/uL (ref 0.2–0.9)
NEUTROS PCT: 91 %
Neutro Abs: 12.1 10*3/uL — ABNORMAL HIGH (ref 1.4–6.5)
Platelets: 368 10*3/uL (ref 150–440)
RBC: 4.16 MIL/uL (ref 3.80–5.20)
RDW: 14.3 % (ref 11.5–14.5)
WBC: 13.3 10*3/uL — AB (ref 3.6–11.0)

## 2015-02-25 LAB — GLUCOSE, CAPILLARY
Glucose-Capillary: 292 mg/dL — ABNORMAL HIGH (ref 65–99)
Glucose-Capillary: 210 mg/dL — ABNORMAL HIGH (ref 65–99)

## 2015-02-25 LAB — BASIC METABOLIC PANEL
ANION GAP: 8 (ref 5–15)
BUN: 23 mg/dL — AB (ref 6–20)
CALCIUM: 9.6 mg/dL (ref 8.9–10.3)
CO2: 31 mmol/L (ref 22–32)
CREATININE: 0.71 mg/dL (ref 0.44–1.00)
Chloride: 88 mmol/L — ABNORMAL LOW (ref 101–111)
GFR calc non Af Amer: >60 mL/min (ref >60)
GLUCOSE: 282 mg/dL — AB (ref 65–99)
Potassium: 4.7 mmol/L (ref 3.5–5.1)
SODIUM: 127 mmol/L — AB (ref 135–145)

## 2015-02-25 MED ORDER — ONDANSETRON HCL 4 MG/2ML IJ SOLN
4.0000 mg | Freq: Once | INTRAMUSCULAR | Status: AC
  Administered 2015-02-25: 4 mg via INTRAVENOUS

## 2015-02-25 MED ORDER — SODIUM CHLORIDE 0.9 % IV BOLUS (SEPSIS)
1000.0000 mL | Freq: Once | INTRAVENOUS | Status: AC
  Administered 2015-02-25: 1000 mL via INTRAVENOUS

## 2015-02-25 MED ORDER — CEPHALEXIN 500 MG PO CAPS: 500.0000 mg | ORAL_CAPSULE | Freq: Four times a day (QID) | ORAL | Status: AC

## 2015-02-25 MED ORDER — ONDANSETRON HCL 4 MG/2ML IJ SOLN
INTRAMUSCULAR | Status: AC
  Filled 2015-02-25: qty 2

## 2015-02-25 MED ORDER — SODIUM CHLORIDE 0.9 % IV BOLUS (SEPSIS)
500.0000 mL | Freq: Once | INTRAVENOUS | Status: AC
  Administered 2015-02-25: 500 mL via INTRAVENOUS

## 2015-02-25 MED ORDER — LIDOCAINE-EPINEPHRINE 2 %-1:100000 IJ SOLN
20.0000 mL | Freq: Once | INTRAMUSCULAR | Status: AC
  Administered 2015-02-25: 20 mL
  Filled 2015-02-25: qty 20

## 2015-02-25 NOTE — ED Provider Notes (Signed)
[facility] Emergency Department Provider Note  ____________________________________________   I have reviewed the triage vital signs and the nursing notes.   HISTORY  Chief Complaint Laceration    HPI Amanda Stark is a 82 y.o. female resides in a nursing home with some history of dementia. She presents in her normal state of health with a large laceration to the left pretibial region. She does not recall how she got it. Telephone call to the nursing home said that there was no fall. Patient did not hit her head. She was in a recliner chair where the metal struts support her feet seem to catch her leg while she was sitting in the chair. No other injury. She is acting her baseline according to her daughter is at the bedside.  Past Medical History  Diagnosis Date  . SVT (supraventricular tachycardia) ([place])   . Thyroid disease   . Instability of right shoulder joint   . Glenohumeral arthritis   . Diabetes mellitus without complication ([place])   . Hypertension   . Hyperlipidemia   . PMR (polymyalgia rheumatica) (H[facility])   . Lumbar spinal stenosis   . Osteoporosis   . Thyroid cancer ([place])   . Esophageal cancer ([place])   . Vertigo     Patient Active Problem List   Diagnosis Date Noted  . Atrial fibrillation, rapid ([place]) 11/20/2014  . SVT (supraventricular tachycardia) ([place]) 08/02/2014  . Hyponatremia 08/02/2014    Past Surgical History  Procedure Laterality Date  . Total thyroidectomy    . Kidney stone removal    . Right lens implant    . Appendectomy    . Abdominal hysterectomy    . Thyroidectomy    . Back surgery      Current Outpatient Rx  Name  Route  Sig  Dispense  Refill  . acetaminophen (TYLENOL) 325 MG tablet   Oral   Take 650 mg by mouth 3 (three) times daily.         . Calcium Carb-Cholecalciferol (CALCIUM 600+D3 PO)   Oral   Take 1 tablet by mouth 2 (two) times daily.         . Cholecalciferol (VITAMIN D3) 5000 UNITS TABS  Oral   Take 5,000 Units by mouth daily.         [name] donepezil (ARICEPT) 5 MG tablet   Oral   Take 5 mg by mouth at bedtime.         [name] guaifenesin (ROBITUSSIN) 100 MG/5ML syrup   Oral   Take 200 mg by mouth every 4 (four) hours as needed for cough or congestion.         . insulin detemir (LEVEMIR) 100 UNIT/ML injection   Subcutaneous   Inject 0.1 mLs (10 Units total) into the skin every morning.   10 mL   11   . insulin detemir (LEVEMIR) 100 UNIT/ML injection   Subcutaneous   Inject 0.05 mLs (5 Units total) into the skin at bedtime.   10 mL   11   . insulin lispro (HUMALOG KWIKPEN) 100 UNIT/ML KiwkPen   Subcutaneous   Inject 4 Units into the skin daily with lunch.         . Iron-Vitamin C (VITRON-C) 65-125 MG TABS   Oral   Take 1 tablet by mouth daily at 12 noon.          [name] levothyroxine (SYNTHROID, LEVOTHROID) 125 MCG tablet   Oral   Take 125 mcg by mouth daily before  breakfast.         . lidocaine (LIDODERM) 5 %   Transdermal   Place 1 patch onto the skin every morning. Apply to right shoulder and back. *Remove & Discard patch within 12 hours or as directed by MD*         . LORazepam (ATIVAN) 1 MG tablet   Oral   Take 1 mg by mouth at bedtime.         . meclizine (ANTIVERT) 25 MG tablet   Oral   Take 25 mg by mouth 3 (three) times daily as needed for nausea.         . meloxicam (MOBIC) 15 MG tablet   Oral   Take 15 mg by mouth daily.         . metoprolol (LOPRESSOR) 50 MG tablet   Oral   Take 1 tablet (50 mg total) by mouth 2 (two) times daily.   1 tablet   1   . Multiple Vitamins-Minerals (CENTRUM SILVER PO)   Oral   Take 1 tablet by mouth daily.         [name] omeprazole (PRILOSEC) 20 MG capsule   Oral   Take 20 mg by mouth daily.         . polyethylene glycol (MIRALAX / GLYCOLAX) packet   Oral   Take 17 g by mouth daily as needed for mild constipation or moderate constipation. Mix with 8 oz of water.         . predniSONE  (DELTASONE) 5 MG tablet   Oral   Take 7.5 mg by mouth daily.         . sotalol (BETAPACE) 80 MG tablet   Oral   Take 0.5 tablets (40 mg total) by mouth every 12 (twelve) hours.   1 tablet   1     Allergies Ascriptin a-d; Aspirin; Codeine; Hydrocodone; Statins; and Tramadol  Family History  Problem Relation Age of Onset  . Cancer Mother   . Cancer Sister     Social History Social History  Substance Use Topics  . Smoking status: Never Smoker   . Smokeless tobacco: Never Used  . Alcohol Use: No    Review of Systems Constitutional: No fever/chills Eyes: No visual changes. ENT: No sore throat. No stiff neck no neck pain Cardiovascular: Denies chest pain. Respiratory: Denies shortness of breath. Gastrointestinal:   no vomiting.  No diarrhea.  No constipation. Genitourinary: Negative for dysuria. Musculoskeletal: Negative lower extremity swelling Skin: Negative for rash. Neurological: Negative for headaches, focal weakness or numbness. 10-point ROS otherwise negative.  ____________________________________________   PHYSICAL EXAM:  VITAL SIGNS: ED Triage Vitals  Enc Vitals Group     BP 02/25/15 1617 141/77 mmHg     Pulse Rate 02/25/15 1617 88     Resp 02/25/15 1617 18     Temp 02/25/15 1617 98.2 F (36.8 C)     Temp Source 02/25/15 1617 Oral     SpO2 02/25/15 1617 98 %     Weight 02/25/15 1617 110 lb 8 oz (50.122 kg)     Height 02/25/15 1617 5\' 3"  (1.6 m)     Head Cir --      Peak Flow --      Pain Score --      Pain Loc --      Pain Edu? --      Excl. in [place]? --     Constitutional: Alert and mostly demented. Well appearing and in no  acute distress. Eyes: Conjunctivae are normal. PERRL. EOMI. Head: Atraumatic. Nose: No congestion/rhinnorhea. Mouth/Throat: Mucous membranes are moist.  Oropharynx non-erythematous. Neck: No stridor.   Nontender with no meningismus Cardiovascular: Normal rate, regular rhythm. Grossly normal heart sounds.  Good peripheral  circulation. Respiratory: Normal respiratory effort.  No retractions. Lungs CTAB. Abdominal: Soft and nontender. No distention. No guarding no rebound Back:  There is no focal tenderness or step off there is no midline tenderness there are no lesions noted. there is no CVA tenderness Musculoskeletal: No lower extremity tenderness. No joint effusions, no DVT signs strong distal pulses no edema Neurologic:  Normal speech and language. No gross focal neurologic deficits are appreciated.  Skin:  Skin is warm, dry and intact. There is a very deep into the muscle 9 cm laceration noted to the left pretibial region with skin tear associated. There is no active bleeding at this time, she is soft compartments, strong distal pulses, no evidence of bony tenderness. No foreign body noted, no evidence of tendon laceration. Strength and sensation is intact distally. Psychiatric: Mood and affect are normal. Speech and behavior are normal.  ____________________________________________   LABS (all labs ordered are listed, but only abnormal results are displayed)  Labs Reviewed  CBC WITH DIFFERENTIAL/PLATELET - Abnormal; Notable for the following:    WBC 13.3 (*)    Hemoglobin 11.4 (*)    HCT 34.4 (*)    Neutro Abs 12.1 (*)    Lymphs Abs 0.8 (*)    All other components within normal limits  URINALYSIS COMPLETEWITH MICROSCOPIC ([facility] ONLY) - Abnormal; Notable for the following:    Color, Urine YELLOW (*)    APPearance HAZY (*)    Glucose, UA >500 (*)    Protein, ur 30 (*)    All other components within normal limits  BASIC METABOLIC PANEL - Abnormal; Notable for the following:    Sodium 127 (*)    Chloride 88 (*)    Glucose, Bld 282 (*)    BUN 23 (*)    All other components within normal limits  GLUCOSE, CAPILLARY - Abnormal; Notable for the following:    Glucose-Capillary 292 (*)    All other components within normal limits  GLUCOSE, CAPILLARY - Abnormal; Notable for the following:     Glucose-Capillary 210 (*)    All other components within normal limits  URINE CULTURE   ____________________________________________  EKG  I personally interpreted any EKGs ordered by me or triage  ____________________________________________  [id number]  I reviewed any imaging ordered by me or triage that were performed during my shift ____________________________________________   PROCEDURES  Procedure(s) performed: LACERATION REPAIR Performed by: Schuyler Amor Authorized by: Schuyler Amor Consent: Verbal consent obtained. Risks and benefits: risks, benefits and alternatives were discussed Consent given by: patient Patient identity confirmed: provided demographic data Prepped and Draped in normal sterile fashion Wound explored  Laceration Location: Left pretibial region  Laceration Length: 9 x 7 cm  No Foreign Bodies seen or palpated  Anesthesia: local infiltration  Local anesthetic: lidocaine 2% % with epinephrine  Anesthetic total: 9 ml  Irrigation method: syringe Amount of cleaning: standard  Skin closure: Steri-Strips, deep sutures were placed interrupted the using rapid Vicryl 4.0.   Number of sutures: 12   Technique: Interrupted   Patient tolerance: Patient tolerated the procedure well with no immediate complications.   Critical Care performed: None  ____________________________________________   INITIAL IMPRESSION / ASSESSMENT AND PLAN / ED COURSE  Pertinent labs & imaging  results that were available during my care of the patient were reviewed by me and considered in my medical decision making (see chart for details).  Patient with no fall but a furniture induced significant laceration to her leg requiring, complicated layered closure.. It was gaping and open. It did go into the superficial muscle. I did perform a layered closure of the deep part of the wound after extensive irrigation and sterile prep to see if we can help healing. We'll  place Steri-Strips on the skin itself. We'll leave for drainage. Given the depth of the wound I will start her on Keflex. Her sodium was 127 which is slightly low but she runs low and has no symptoms of that. We'll give her a liter of IV fluid. Family very much would prefer not to be admitted to the hospital which I agree with. We'll have them recheck her sodium a few days. She does not walk at baseline but we'll place a splint on temporally to help this heal. We'll start her on Keflex. Return precautions and follow-up given and understood. ____________________________________________   FINAL CLINICAL IMPRESSION(S) / ED DIAGNOSES  Final diagnoses:  None      This chart was dictated using voice recognition software.  Despite best efforts to proofread,  errors can occur which can change meaning.     Schuyler Amor, MD 02/25/15 1919

## 2015-02-25 NOTE — ED Notes (Signed)
Pt brought by ems from [facility], pt was found by staff to have blood on her pants, no known injury, laceration found to the left shin at 8.5 cm long no bleeding at this time

## 2015-02-25 NOTE — ED Notes (Signed)
Pt taking macrobid for UTI since Monday

## 2015-02-25 NOTE — Discharge Instructions (Signed)
Delayed Wound Closure This was a very deep laceration, we have brought the muscle together however the skin cannot fever.. Please have it checked every day to make sure there is no evidence of infection. Take the antibiotics as prescribed. If there is signs of infection including redness, pus, swelling or fever please return to the emergency room. Where the splint for the next week to keep her from moving that leg too much. I did place deep sutures into the wound as you saw which are absorbable and will not need to be removed. We also notice that her sodium is somewhat low. We have given her IV fluid. Please encourage her to drink I-containing solutions and have her sodium checked on Monday. HOME CARE INSTRUCTIONS  Rest and elevate the injured area until the pain and swelling are gone.  Have your wound checked daily as instructed by your health care provider. SEEK MEDICAL CARE IF:  You develop unusual or increased swelling or redness around the wound.  You have increasing pain or tenderness.  There is increasing fluid (drainage) or a bad smelling drainage coming from the wound.   This information is not intended to replace advice given to you by your health care provider. Make sure you discuss any questions you have with your health care provider.   Document Released: 01/14/2005 Document Revised: 01/19/2013 Document Reviewed: 07/14/2012 Elsevier Interactive Patient Education [employer].

## 2015-02-25 NOTE — ED Notes (Signed)
AAOx3.  Skin warm and dry.  NAD.  Report called to assisted living facility.  D/C home

## 2015-02-27 LAB — URINE CULTURE: Culture: NO GROWTH

## 2015-05-03 ENCOUNTER — Inpatient Hospital Stay
Admission: EM | Admit: 2015-05-03 | Discharge: 2015-05-08 | DRG: 480 | Disposition: A | Discharge status: Skilled Nursing Facility | Attending: Internal Medicine | Admitting: Internal Medicine

## 2015-05-03 ENCOUNTER — Encounter: Payer: Self-pay | Admitting: Intensive Care

## 2015-05-03 ENCOUNTER — Emergency Department

## 2015-05-03 DIAGNOSIS — Z809 Family history of malignant neoplasm, unspecified: Secondary | ICD-10-CM | POA: Diagnosis not present

## 2015-05-03 DIAGNOSIS — K219 Gastro-esophageal reflux disease without esophagitis: Secondary | ICD-10-CM | POA: Diagnosis present

## 2015-05-03 DIAGNOSIS — Z7952 Long term (current) use of systemic steroids: Secondary | ICD-10-CM | POA: Diagnosis not present

## 2015-05-03 DIAGNOSIS — S81802A Unspecified open wound, left lower leg, initial encounter: Secondary | ICD-10-CM | POA: Diagnosis present

## 2015-05-03 DIAGNOSIS — Z885 Allergy status to narcotic agent status: Secondary | ICD-10-CM

## 2015-05-03 DIAGNOSIS — D638 Anemia in other chronic diseases classified elsewhere: Secondary | ICD-10-CM | POA: Diagnosis present

## 2015-05-03 DIAGNOSIS — E119 Type 2 diabetes mellitus without complications: Secondary | ICD-10-CM | POA: Diagnosis present

## 2015-05-03 DIAGNOSIS — S72141A Displaced intertrochanteric fracture of right femur, initial encounter for closed fracture: Secondary | ICD-10-CM | POA: Diagnosis present

## 2015-05-03 DIAGNOSIS — E871 Hypo-osmolality and hyponatremia: Secondary | ICD-10-CM | POA: Diagnosis present

## 2015-05-03 DIAGNOSIS — Z681 Body mass index (BMI) 19 or less, adult: Secondary | ICD-10-CM

## 2015-05-03 DIAGNOSIS — M810 Age-related osteoporosis without current pathological fracture: Secondary | ICD-10-CM | POA: Diagnosis present

## 2015-05-03 DIAGNOSIS — Y92129 Unspecified place in nursing home as the place of occurrence of the external cause: Secondary | ICD-10-CM

## 2015-05-03 DIAGNOSIS — I2510 Atherosclerotic heart disease of native coronary artery without angina pectoris: Secondary | ICD-10-CM | POA: Diagnosis present

## 2015-05-03 DIAGNOSIS — J189 Pneumonia, unspecified organism: Secondary | ICD-10-CM | POA: Diagnosis not present

## 2015-05-03 DIAGNOSIS — R112 Nausea with vomiting, unspecified: Secondary | ICD-10-CM | POA: Diagnosis not present

## 2015-05-03 DIAGNOSIS — D62 Acute posthemorrhagic anemia: Secondary | ICD-10-CM | POA: Diagnosis not present

## 2015-05-03 DIAGNOSIS — S72001A Fracture of unspecified part of neck of right femur, initial encounter for closed fracture: Secondary | ICD-10-CM | POA: Diagnosis present

## 2015-05-03 DIAGNOSIS — Z419 Encounter for procedure for purposes other than remedying health state, unspecified: Secondary | ICD-10-CM

## 2015-05-03 DIAGNOSIS — Z8501 Personal history of malignant neoplasm of esophagus: Secondary | ICD-10-CM

## 2015-05-03 DIAGNOSIS — Z85850 Personal history of malignant neoplasm of thyroid: Secondary | ICD-10-CM

## 2015-05-03 DIAGNOSIS — Z66 Do not resuscitate: Secondary | ICD-10-CM | POA: Diagnosis present

## 2015-05-03 DIAGNOSIS — Z993 Dependence on wheelchair: Secondary | ICD-10-CM | POA: Diagnosis not present

## 2015-05-03 DIAGNOSIS — Z888 Allergy status to other drugs, medicaments and biological substances status: Secondary | ICD-10-CM | POA: Diagnosis not present

## 2015-05-03 DIAGNOSIS — M4806 Spinal stenosis, lumbar region: Secondary | ICD-10-CM | POA: Diagnosis present

## 2015-05-03 DIAGNOSIS — E860 Dehydration: Secondary | ICD-10-CM | POA: Diagnosis present

## 2015-05-03 DIAGNOSIS — Z79899 Other long term (current) drug therapy: Secondary | ICD-10-CM

## 2015-05-03 DIAGNOSIS — I4891 Unspecified atrial fibrillation: Secondary | ICD-10-CM | POA: Diagnosis present

## 2015-05-03 DIAGNOSIS — W1830XA Fall on same level, unspecified, initial encounter: Secondary | ICD-10-CM | POA: Diagnosis present

## 2015-05-03 DIAGNOSIS — R410 Disorientation, unspecified: Secondary | ICD-10-CM | POA: Diagnosis not present

## 2015-05-03 DIAGNOSIS — M25311 Other instability, right shoulder: Secondary | ICD-10-CM | POA: Diagnosis present

## 2015-05-03 DIAGNOSIS — E785 Hyperlipidemia, unspecified: Secondary | ICD-10-CM | POA: Diagnosis present

## 2015-05-03 DIAGNOSIS — D72829 Elevated white blood cell count, unspecified: Secondary | ICD-10-CM

## 2015-05-03 DIAGNOSIS — E079 Disorder of thyroid, unspecified: Secondary | ICD-10-CM | POA: Diagnosis present

## 2015-05-03 DIAGNOSIS — I119 Hypertensive heart disease without heart failure: Secondary | ICD-10-CM | POA: Diagnosis present

## 2015-05-03 DIAGNOSIS — M353 Polymyalgia rheumatica: Secondary | ICD-10-CM | POA: Diagnosis present

## 2015-05-03 DIAGNOSIS — F0390 Unspecified dementia without behavioral disturbance: Secondary | ICD-10-CM | POA: Diagnosis present

## 2015-05-03 DIAGNOSIS — Z794 Long term (current) use of insulin: Secondary | ICD-10-CM | POA: Diagnosis not present

## 2015-05-03 DIAGNOSIS — Z7984 Long term (current) use of oral hypoglycemic drugs: Secondary | ICD-10-CM

## 2015-05-03 DIAGNOSIS — Z886 Allergy status to analgesic agent status: Secondary | ICD-10-CM | POA: Diagnosis not present

## 2015-05-03 LAB — BASIC METABOLIC PANEL
ANION GAP: 10 (ref 5–15)
BUN: 23 mg/dL — ABNORMAL HIGH (ref 6–20)
CHLORIDE: 88 mmol/L — AB (ref 101–111)
CO2: 29 mmol/L (ref 22–32)
Calcium: 9.3 mg/dL (ref 8.9–10.3)
Creatinine, Ser: 0.77 mg/dL (ref 0.44–1.00)
GFR calc non Af Amer: >60 mL/min (ref >60)
Glucose, Bld: 193 mg/dL — ABNORMAL HIGH (ref 65–99)
POTASSIUM: 4.1 mmol/L (ref 3.5–5.1)
SODIUM: 127 mmol/L — AB (ref 135–145)

## 2015-05-03 LAB — CBC
HEMATOCRIT: 32.5 % — AB (ref 35.0–47.0)
HEMOGLOBIN: 10.9 g/dL — AB (ref 12.0–16.0)
MCH: 27.8 pg (ref 26.0–34.0)
MCHC: 33.6 g/dL (ref 32.0–36.0)
MCV: 82.8 fL (ref 80.0–100.0)
Platelets: 371 10*3/uL (ref 150–440)
RBC: 3.92 MIL/uL (ref 3.80–5.20)
RDW: 13.4 % (ref 11.5–14.5)
WBC: 13.6 10*3/uL — AB (ref 3.6–11.0)

## 2015-05-03 LAB — URINALYSIS COMPLETE WITH MICROSCOPIC (ARMC ONLY)
BILIRUBIN URINE: NEGATIVE
Bacteria, UA: NONE SEEN
GLUCOSE, UA: 50 mg/dL — AB
Hgb urine dipstick: NEGATIVE
Ketones, ur: NEGATIVE mg/dL
LEUKOCYTES UA: NEGATIVE
NITRITE: NEGATIVE
PH: 7 (ref 5.0–8.0)
Protein, ur: 100 mg/dL — AB
Specific Gravity, Urine: 1.012 (ref 1.005–1.030)

## 2015-05-03 LAB — SURGICAL PCR SCREEN
MRSA, PCR: NEGATIVE
Staphylococcus aureus: NEGATIVE

## 2015-05-03 LAB — GLUCOSE, CAPILLARY
GLUCOSE-CAPILLARY: 183 mg/dL — AB (ref 65–99)
Glucose-Capillary: 154 mg/dL — ABNORMAL HIGH (ref 65–99)

## 2015-05-03 LAB — ABO/RH: ABO/RH(D): A POS

## 2015-05-03 MED ORDER — PANTOPRAZOLE SODIUM 40 MG PO TBEC
40.0000 mg | DELAYED_RELEASE_TABLET | Freq: Every day | ORAL | Status: DC
  Administered 2015-05-03 – 2015-05-08 (×5): 40 mg via ORAL
  Filled 2015-05-03 (×6): qty 1

## 2015-05-03 MED ORDER — ACETAMINOPHEN 650 MG RE SUPP: 650.0000 mg | Freq: Four times a day (QID) | RECTAL | Status: DC | PRN

## 2015-05-03 MED ORDER — ONDANSETRON HCL 4 MG/2ML IJ SOLN
4.0000 mg | Freq: Four times a day (QID) | INTRAMUSCULAR | Status: DC | PRN
  Administered 2015-05-03 – 2015-05-05 (×3): 4 mg via INTRAVENOUS
  Filled 2015-05-03 (×3): qty 2

## 2015-05-03 MED ORDER — GUAIFENESIN 100 MG/5ML PO SYRP
200.0000 mg | ORAL_SOLUTION | ORAL | Status: DC | PRN
Start: 2015-05-03 — End: 2015-05-08
  Filled 2015-05-03: qty 10

## 2015-05-03 MED ORDER — DONEPEZIL HCL 5 MG PO TABS
5.0000 mg | ORAL_TABLET | Freq: Every day | ORAL | Status: DC
  Administered 2015-05-03 – 2015-05-06 (×4): 5 mg via ORAL
  Filled 2015-05-03 (×4): qty 1

## 2015-05-03 MED ORDER — ONDANSETRON HCL 4 MG PO TABS: 4.0000 mg | ORAL_TABLET | Freq: Four times a day (QID) | ORAL | Status: DC | PRN

## 2015-05-03 MED ORDER — SOTALOL HCL 80 MG PO TABS
40.0000 mg | ORAL_TABLET | Freq: Two times a day (BID) | ORAL | Status: DC
  Administered 2015-05-03 – 2015-05-08 (×10): 40 mg via ORAL
  Filled 2015-05-03: qty 2
  Filled 2015-05-03 (×2): qty 1
  Filled 2015-05-03 (×2): qty 2
  Filled 2015-05-03 (×5): qty 1

## 2015-05-03 MED ORDER — METOPROLOL TARTRATE 50 MG PO TABS
50.0000 mg | ORAL_TABLET | Freq: Two times a day (BID) | ORAL | Status: DC
  Administered 2015-05-03 – 2015-05-08 (×9): 50 mg via ORAL
  Filled 2015-05-03 (×9): qty 1

## 2015-05-03 MED ORDER — DOCUSATE SODIUM 100 MG PO CAPS
100.0000 mg | ORAL_CAPSULE | Freq: Every day | ORAL | Status: DC
  Administered 2015-05-03: 100 mg via ORAL
  Filled 2015-05-03: qty 1

## 2015-05-03 MED ORDER — GLUCERNA SHAKE PO LIQD
237.0000 mL | Freq: Two times a day (BID) | ORAL | Status: DC
  Administered 2015-05-05 – 2015-05-08 (×5): 237 mL via ORAL

## 2015-05-03 MED ORDER — SODIUM CHLORIDE 0.9 % IV SOLN
INTRAVENOUS | Status: DC
  Administered 2015-05-03: 50 mL/h via INTRAVENOUS
  Administered 2015-05-04: 16:00:00 via INTRAVENOUS

## 2015-05-03 MED ORDER — LORAZEPAM 1 MG PO TABS
1.0000 mg | ORAL_TABLET | Freq: Every day | ORAL | Status: DC
  Administered 2015-05-03 – 2015-05-07 (×5): 1 mg via ORAL
  Filled 2015-05-03 (×5): qty 1

## 2015-05-03 MED ORDER — ACETAMINOPHEN 325 MG PO TABS: 650.0000 mg | ORAL_TABLET | Freq: Four times a day (QID) | ORAL | Status: DC | PRN

## 2015-05-03 MED ORDER — ALBUTEROL SULFATE (2.5 MG/3ML) 0.083% IN NEBU: 2.5000 mg | INHALATION_SOLUTION | RESPIRATORY_TRACT | Status: DC | PRN

## 2015-05-03 MED ORDER — MORPHINE SULFATE (PF) 4 MG/ML IV SOLN
4.0000 mg | INTRAVENOUS | Status: DC | PRN
  Administered 2015-05-03: 4 mg via INTRAVENOUS
  Filled 2015-05-03: qty 1

## 2015-05-03 MED ORDER — INSULIN ASPART 100 UNIT/ML ~~LOC~~ SOLN
0.0000 [IU] | Freq: Three times a day (TID) | SUBCUTANEOUS | Status: DC
  Administered 2015-05-04: 2 [IU] via SUBCUTANEOUS
  Administered 2015-05-04: 1 [IU] via SUBCUTANEOUS
  Administered 2015-05-05: 3 [IU] via SUBCUTANEOUS
  Administered 2015-05-05: 2 [IU] via SUBCUTANEOUS
  Administered 2015-05-05 – 2015-05-07 (×2): 3 [IU] via SUBCUTANEOUS
  Administered 2015-05-08 (×2): 2 [IU] via SUBCUTANEOUS
  Filled 2015-05-03: qty 3
  Filled 2015-05-03: qty 2
  Filled 2015-05-03: qty 1
  Filled 2015-05-03: qty 3
  Filled 2015-05-03 (×2): qty 2
  Filled 2015-05-03 (×2): qty 3
  Filled 2015-05-03: qty 2

## 2015-05-03 MED ORDER — MECLIZINE HCL 25 MG PO TABS: 25.0000 mg | ORAL_TABLET | Freq: Three times a day (TID) | ORAL | Status: DC | PRN

## 2015-05-03 MED ORDER — POLYETHYLENE GLYCOL 3350 17 G PO PACK
17.0000 g | PACK | ORAL | Status: DC
  Administered 2015-05-03 – 2015-05-07 (×3): 17 g via ORAL
  Filled 2015-05-03 (×3): qty 1

## 2015-05-03 MED ORDER — INSULIN ASPART 100 UNIT/ML ~~LOC~~ SOLN: 0.0000 [IU] | Freq: Every day | SUBCUTANEOUS | Status: DC

## 2015-05-03 MED ORDER — LEVOTHYROXINE SODIUM 137 MCG PO TABS
137.0000 ug | ORAL_TABLET | Freq: Every day | ORAL | Status: DC
  Administered 2015-05-05 – 2015-05-08 (×3): 137 ug via ORAL
  Filled 2015-05-03 (×3): qty 1

## 2015-05-03 MED ORDER — CEFAZOLIN SODIUM-DEXTROSE 2-4 GM/100ML-% IV SOLN
2.0000 g | INTRAVENOUS | Status: DC
  Filled 2015-05-03: qty 100

## 2015-05-03 MED ORDER — AMLODIPINE BESYLATE 5 MG PO TABS
5.0000 mg | ORAL_TABLET | Freq: Every day | ORAL | Status: DC
  Administered 2015-05-03 – 2015-05-08 (×4): 5 mg via ORAL
  Filled 2015-05-03 (×4): qty 1

## 2015-05-03 MED ORDER — ONDANSETRON HCL 4 MG/2ML IJ SOLN
4.0000 mg | Freq: Once | INTRAMUSCULAR | Status: AC
  Administered 2015-05-03: 4 mg via INTRAVENOUS
  Filled 2015-05-03: qty 2

## 2015-05-03 MED ORDER — FENTANYL CITRATE (PF) 100 MCG/2ML IJ SOLN
50.0000 ug | Freq: Once | INTRAMUSCULAR | Status: AC
  Administered 2015-05-03: 50 ug via INTRAVENOUS
  Filled 2015-05-03: qty 2

## 2015-05-03 NOTE — H&P (Signed)
[facility] NAME: Amanda Stark    MR#:  [id number]  DATE OF BIRTH:  07-Jan-1923  DATE OF ADMISSION:  05/03/2015  PRIMARY CARE PHYSICIAN: Pcp Not In System   REQUESTING/REFERRING PHYSICIAN: Harvest Dark, MD  CHIEF COMPLAINT:   Chief Complaint  Patient presents with  . Hip Pain  Right hip pain after fall today.  HISTORY OF PRESENT ILLNESS:  Amanda Stark  is a 82 y.o. female with a known history of Hypertension, hyperlipidemia and A. fib. The patient has had the poor oral intake and generalized weakness for the past a few days. She fell to the floor in nursing home today. She complained of right hip pain, unable to move. She denies any syncope or loss of consciousness. According to her daughter, she was treated for UTI recently. But the she denies any dysuria, hematuria or fever. X-ray show right hip fracture.  PAST MEDICAL HISTORY:   Past Medical History  Diagnosis Date  . SVT (supraventricular tachycardia) ([place])   . Thyroid disease   . Instability of right shoulder joint   . Glenohumeral arthritis   . Diabetes mellitus without complication ([place])   . Hypertension   . Hyperlipidemia   . PMR (polymyalgia rheumatica) ([facility])   . Lumbar spinal stenosis   . Osteoporosis   . Thyroid cancer ([place])   . Esophageal cancer ([place])   . Vertigo     PAST SURGICAL HISTORY:   Past Surgical History  Procedure Laterality Date  . Total thyroidectomy    . Kidney stone removal    . Right lens implant    . Appendectomy    . Abdominal hysterectomy    . Thyroidectomy    . Back surgery      SOCIAL HISTORY:   Social History  Substance Use Topics  . Smoking status: Never Smoker   . Smokeless tobacco: Never Used  . Alcohol Use: No    FAMILY HISTORY:   Family History  Problem Relation Age of Onset  . Cancer Mother   . Cancer Sister     DRUG ALLERGIES:   Allergies  Allergen Reactions  . Ascriptin A-D [Aspirin  Buf(Alhyd-Mghyd-Cacar)] Other (See Comments)    Reaction:  Unknown   . Aspirin Other (See Comments)    Reaction:  Unknown   . Codeine Other (See Comments)    Reaction:  Unknown   . Hydrocodone Other (See Comments)    Reaction:  Unknown   . Statins Other (See Comments)    Reaction:  Muscle pain    . Tramadol Nausea And Vomiting    REVIEW OF SYSTEMS:  CONSTITUTIONAL: No fever,But has poor oral intake and generalized weakness.  EYES: No blurred or double vision.  EARS, NOSE, AND THROAT: No tinnitus or ear pain.  RESPIRATORY: No cough, shortness of breath, wheezing or hemoptysis.  CARDIOVASCULAR: No chest pain, orthopnea, edema.  GASTROINTESTINAL: No nausea, vomiting, diarrhea or abdominal pain.  GENITOURINARY: No dysuria, hematuria.  ENDOCRINE: No polyuria, nocturia,  HEMATOLOGY: No anemia, easy bruising or bleeding SKIN: No rash or lesion. MUSCULOSKELETAL: Right hip pain.  NEUROLOGIC: No tingling, numbness, weakness.  PSYCHIATRY: No anxiety or depression.   MEDICATIONS AT HOME:   Prior to Admission medications   Medication Sig Start Date End Date Taking? Authorizing Provider  acetaminophen (TYLENOL) 325 MG tablet Take 650 mg by mouth 3 (three) times daily.   Yes Historical Provider, MD  amLODipine (NORVASC) 5 MG tablet Take 5 mg by  mouth daily.   Yes Historical Provider, MD  Calcium Carbonate-Vitamin D (CALCIUM 600+D) 600-400 MG-UNIT tablet Take 1 tablet by mouth 2 (two) times daily.   Yes Historical Provider, MD  Cholecalciferol (VITAMIN D3) 5000 UNITS TABS Take 5,000 Units by mouth at bedtime.    Yes Historical Provider, MD  docusate sodium (COLACE) 100 MG capsule Take 100 mg by mouth at bedtime.   Yes Historical Provider, MD  donepezil (ARICEPT) 5 MG tablet Take 5 mg by mouth at bedtime.   Yes Historical Provider, MD  feeding supplement, GLUCERNA SHAKE, (GLUCERNA SHAKE) LIQD Take 237 mLs by mouth 2 (two) times daily between meals.   Yes Historical Provider, MD  glucagon  (GLUCAGEN) 1 MG SOLR injection Inject 1 mg into the muscle once as needed for low blood sugar.   Yes Historical Provider, MD  guaifenesin (ROBITUSSIN) 100 MG/5ML syrup Take 200 mg by mouth every 4 (four) hours as needed for cough.    Yes Historical Provider, MD  insulin detemir (LEVEMIR) 100 UNIT/ML injection Inject 4-10 Units into the skin 2 (two) times daily. Pt uses 10 units in the morning and 4 units at bedtime.   Yes Historical Provider, MD  insulin lispro (HUMALOG) 100 UNIT/ML injection Inject 1-5 Units into the skin 3 (three) times daily with meals as needed for high blood sugar. Pt uses as needed per sliding scale:    200-249:  1 unit  250-299:  2 units  300-349:  3 units  350-399:  4 units  Greater than 400:  5 units   Yes Historical Provider, MD  Iron-Vitamin C (VITRON-C) 65-125 MG TABS Take 1 tablet by mouth daily at 12 noon.    Yes Historical Provider, MD  levothyroxine (SYNTHROID, LEVOTHROID) 137 MCG tablet Take 137 mcg by mouth daily before breakfast.   Yes Historical Provider, MD  lidocaine (LIDODERM) 5 % Place 1 patch onto the skin daily.    Yes Historical Provider, MD  LORazepam (ATIVAN) 1 MG tablet Take 1 mg by mouth at bedtime.   Yes Historical Provider, MD  meclizine (ANTIVERT) 25 MG tablet Take 25 mg by mouth 3 (three) times daily as needed for nausea.   Yes Historical Provider, MD  meloxicam (MOBIC) 15 MG tablet Take 15 mg by mouth at bedtime.    Yes Historical Provider, MD  metFORMIN (GLUCOPHAGE) 1000 MG tablet Take 1,000 mg by mouth 2 (two) times daily with a meal.   Yes Historical Provider, MD  metoprolol (LOPRESSOR) 50 MG tablet Take 1 tablet (50 mg total) by mouth 2 (two) times daily. 11/22/14  Yes Rusty Aus, MD  Multiple Vitamin (MULTIVITAMIN WITH MINERALS) TABS tablet Take 1 tablet by mouth daily.   Yes Historical Provider, MD  omeprazole (PRILOSEC) 20 MG capsule Take 20 mg by mouth daily.   Yes Historical Provider, MD  polyethylene glycol (MIRALAX / GLYCOLAX)  packet Take 17 g by mouth every other day.    Yes Historical Provider, MD  predniSONE (DELTASONE) 5 MG tablet Take 7.5 mg by mouth daily with breakfast.    Yes Historical Provider, MD  Skin Protectants, Misc. (ENDIT EX) Apply 1 application topically as needed (for redness/irritation).   Yes Historical Provider, MD  sotalol (BETAPACE) 80 MG tablet Take 0.5 tablets (40 mg total) by mouth every 12 (twelve) hours. 11/22/14  Yes Rusty Aus, MD      VITAL SIGNS:  Blood pressure 154/62, pulse 72, temperature 98 F (36.7 C), temperature source Oral, resp. rate 19, height  5\' 3"  (1.6 m), weight 47.174 kg (104 lb), SpO2 99 %.  PHYSICAL EXAMINATION:  GENERAL:  82 y.o.-year-old patient lying in the bed with no acute distress.  EYES: Pupils equal, round, reactive to light and accommodation. No scleral icterus. Extraocular muscles intact.  HEENT: Head atraumatic, normocephalic. Oropharynx and nasopharynx clear.  NECK:  Supple, no jugular venous distention. No thyroid enlargement, no tenderness.  LUNGS: Normal breath sounds bilaterally, no wheezing, rales,rhonchi or crepitation. No use of accessory muscles of respiration.  CARDIOVASCULAR: S1, S2 normal. No murmurs, rubs, or gallops.  ABDOMEN: Soft, nontender, nondistended. Bowel sounds present. No organomegaly or mass.  EXTREMITIES: No pedal edema, cyanosis, or clubbing. Unable to move lower extremity. Deformity of right leg. NEUROLOGIC: Cranial nerves II through XII are intact.  PSYCHIATRIC: The patient is alert and oriented x 3.  SKIN: No obvious rash, lesion, or ulcer.   LABORATORY PANEL:   CBC  Recent Labs Lab 05/03/15 1710  WBC 13.6*  HGB 10.9*  HCT 32.5*  PLT 371   ------------------------------------------------------------------------------------------------------------------  Chemistries   Recent Labs Lab 05/03/15 1710  NA 127*  K 4.1  CL 88*  CO2 29  GLUCOSE 193*  BUN 23*  CREATININE 0.77  CALCIUM 9.3    ------------------------------------------------------------------------------------------------------------------  Cardiac Enzymes No results for input(s): TROPONINI in the last 168 hours. ------------------------------------------------------------------------------------------------------------------  RADIOLOGY:  Dg Chest 1 View  05/03/2015  CLINICAL DATA:  Fall today on right side with right hip fracture EXAM: CHEST 1 VIEW COMPARISON:  01/29/2015 FINDINGS: Cardiac shadow is again enlarged. A hiatal hernia is again seen and stable. Aortic calcifications are noted. The lungs are well-aerated without focal infiltrate. Multiple old rib fractures on the right are seen with chest wall deformity. IMPRESSION: Chronic changes without acute abnormality. Electronically Signed   By: Inez Catalina M.D.   On: 05/03/2015 18:07   Dg Hip Unilat With Pelvis 2-3 Views Right  05/03/2015  CLINICAL DATA:  Fall today with right hip pain.  Initial encounter. EXAM: DG HIP (WITH OR WITHOUT PELVIS) 2-3V RIGHT COMPARISON:  None. FINDINGS: Acute fracture of the proximal right femur present through the intratrochanteric region. This fracture shows mild displacement. No dislocation. No other fractures are identified. No bony lesions are seen. Vascular calcifications are seen in the femoral arteries bilaterally. IMPRESSION: Acute intratrochanteric fracture of the right hip with mild displacement. Electronically Signed   By: Aletta Edouard M.D.   On: 05/03/2015 18:07    EKG:   Orders placed or performed during the hospital encounter of 05/03/15  . ED EKG  . ED EKG  . EKG 12-Lead  . EKG 12-Lead  . EKG 12-Lead  . EKG 12-Lead    IMPRESSION AND PLAN:   Right hip fracture The patient will be admitted in medical floor. She has moderate to high risk for hip surgery.I will get cardiology consult for pre-operation clearance. Hold Mobic. Pain control, follow-up Dr. Roland Rack, orthopedic surgeon for surgery tomorrow. PT and DVT  prophylaxis after surgery.  Hyponatremia and dehydration. Start normal saline IV and follow-up BMP.  Leukocytosis. Possible due to reaction to hip fracture. Follow-up CBC.  Diabetes. Hold Levemir and metformin, and start sliding scale.  Hypertension. Continue Norvasc and Lopressor.  History of A. fib. Continue sotalol and Lopressor.  Anemia of chronic disease. Stable.  All the records are reviewed and case discussed with ED provider. Management plans discussed with the patient, Her son and daughter, and they are in agreement.  CODE STATUS: DO NOT RESUSCITATE  TOTAL TIME  TAKING CARE OF THIS PATIENT: 56 minutes.    Demetrios Loll M.D on 05/03/2015 at 7:52 PM  Between 7am to 6pm - Pager - [phone]  After 6pm go to [email or web address] - password [email or web address] [facility]  Office  [phone]  CC: Primary care physician; Pcp Not In System

## 2015-05-03 NOTE — ED Provider Notes (Signed)
[facility] Emergency Department Provider Note  Time seen: 4:47 PM  I have reviewed the triage vital signs and the nursing notes.   HISTORY  Chief Complaint Hip Pain    HPI Amanda Stark is a 82 y.o. female with a past medical history of arthritis, hypertension, hyperlipidemia, diabetes, osteoporosis, who presents the emergency department right hip pain after a fall. According to the patient and paramedics the patient was ambulating with her walker when she had a fall onto her right side. States she was unable to stand due to right hip pain and EMS was called. [facility].  Patient does not believe she hit her head, denies any headache. Denies any chest or abdominal pain. States 11/10 right hip pain, much worse with attempted movement. Patient received fentanyl prior to ER arrival, is complaining of some nausea.     Past Medical History  Diagnosis Date  . SVT (supraventricular tachycardia) ([place])   . Thyroid disease   . Instability of right shoulder joint   . Glenohumeral arthritis   . Diabetes mellitus without complication ([place])   . Hypertension   . Hyperlipidemia   . PMR (polymyalgia rheumatica) ([facility])   . Lumbar spinal stenosis   . Osteoporosis   . Thyroid cancer ([place])   . Esophageal cancer ([place])   . Vertigo     Patient Active Problem List   Diagnosis Date Noted  . Atrial fibrillation, rapid ([place]) 11/20/2014  . SVT (supraventricular tachycardia) ([place]) 08/02/2014  . Hyponatremia 08/02/2014    Past Surgical History  Procedure Laterality Date  . Total thyroidectomy    . Kidney stone removal    . Right lens implant    . Appendectomy    . Abdominal hysterectomy    . Thyroidectomy    . Back surgery      Current Outpatient Rx  Name  Route  Sig  Dispense  Refill  . acetaminophen (TYLENOL) 325 MG tablet   Oral   Take 650 mg by mouth 3 (three) times daily.         . Calcium Carb-Cholecalciferol  (CALCIUM 600+D3 PO)   Oral   Take 1 tablet by mouth 2 (two) times daily.         . Cholecalciferol (VITAMIN D3) 5000 UNITS TABS   Oral   Take 5,000 Units by mouth daily.         [name] donepezil (ARICEPT) 5 MG tablet   Oral   Take 5 mg by mouth at bedtime.         [name] guaifenesin (ROBITUSSIN) 100 MG/5ML syrup   Oral   Take 200 mg by mouth every 4 (four) hours as needed for cough or congestion.         . insulin detemir (LEVEMIR) 100 UNIT/ML injection   Subcutaneous   Inject 0.1 mLs (10 Units total) into the skin every morning.   10 mL   11   . insulin detemir (LEVEMIR) 100 UNIT/ML injection   Subcutaneous   Inject 0.05 mLs (5 Units total) into the skin at bedtime.   10 mL   11   . insulin lispro (HUMALOG KWIKPEN) 100 UNIT/ML KiwkPen   Subcutaneous   Inject 4 Units into the skin daily with lunch.         . Iron-Vitamin C (VITRON-C) 65-125 MG TABS   Oral   Take 1 tablet by mouth daily at 12 noon.          [name]  levothyroxine (SYNTHROID, LEVOTHROID) 125 MCG tablet   Oral   Take 125 mcg by mouth daily before breakfast.         . lidocaine (LIDODERM) 5 %   Transdermal   Place 1 patch onto the skin every morning. Apply to right shoulder and back. *Remove & Discard patch within 12 hours or as directed by MD*         . LORazepam (ATIVAN) 1 MG tablet   Oral   Take 1 mg by mouth at bedtime.         . meclizine (ANTIVERT) 25 MG tablet   Oral   Take 25 mg by mouth 3 (three) times daily as needed for nausea.         . meloxicam (MOBIC) 15 MG tablet   Oral   Take 15 mg by mouth daily.         . metoprolol (LOPRESSOR) 50 MG tablet   Oral   Take 1 tablet (50 mg total) by mouth 2 (two) times daily.   1 tablet   1   . Multiple Vitamins-Minerals (CENTRUM SILVER PO)   Oral   Take 1 tablet by mouth daily.         [name] omeprazole (PRILOSEC) 20 MG capsule   Oral   Take 20 mg by mouth daily.         . polyethylene glycol (MIRALAX / GLYCOLAX) packet   Oral    Take 17 g by mouth daily as needed for mild constipation or moderate constipation. Mix with 8 oz of water.         . predniSONE (DELTASONE) 5 MG tablet   Oral   Take 7.5 mg by mouth daily.         . sotalol (BETAPACE) 80 MG tablet   Oral   Take 0.5 tablets (40 mg total) by mouth every 12 (twelve) hours.   1 tablet   1     Allergies Ascriptin a-d; Aspirin; Codeine; Hydrocodone; Statins; and Tramadol  Family History  Problem Relation Age of Onset  . Cancer Mother   . Cancer Sister     Social History Social History  Substance Use Topics  . Smoking status: Never Smoker   . Smokeless tobacco: Never Used  . Alcohol Use: No    Review of Systems Constitutional: Negative for fever. Cardiovascular: Negative for chest pain. Respiratory: Negative for shortness of breath. Gastrointestinal: Negative for abdominal pain. Positive for nausea. Genitourinary: Negative for dysuria. Musculoskeletal: Positive for right hip pain. Neurological: Negative for headache 10-point ROS otherwise negative.  ____________________________________________   PHYSICAL EXAM:  Constitutional: Alert and oriented. Well appearing and in no distress. Eyes: Normal exam ENT   Head: Normocephalic and atraumatic.   Mouth/Throat: Mucous membranes are moist. Cardiovascular: Normal rate, regular rhythm.  Respiratory: Normal respiratory effort without tachypnea nor retractions. Breath sounds are clear Gastrointestinal: Soft and nontender. No distention. Musculoskeletal: Moderate tenderness palpation of the right hip. Neurovascularly intact distally. Good range of motion in the left lower extremity and hip. Extremities otherwise atraumatic. Neurologic:  Normal speech and language. No gross focal neurologic deficits  Skin:  Skin is warm, dry and intact.  Psychiatric: Mood and affect are normal.   ____________________________________________    EKG  EKG reviewed and interpreted by myself shows  normal sinus rhythm at 60 bpm, narrow QRS, left axis deviation, normal intervals, no ST changes.  ____________________________________________    RADIOLOGY  X-ray consistent with right intertrochanteric hip fracture Chest x-ray shows no  acute abnormality  ____________________________________________   INITIAL IMPRESSION / ASSESSMENT AND PLAN / ED COURSE  Pertinent labs & imaging results that were available during my care of the patient were reviewed by me and considered in my medical decision making (see chart for details).  Patient presents the emergency department with right hip pain following a fall at her nursing facility. We will check labs, right hip x-ray, and closely monitor in the emergency department. Currently the patient overall appears very well, no distress.  X-ray consistent with right intertrochanteric hip fracture. Hyponatremic on labs. I discussed the patient with orthopedics. Patient will be admitted to the hospitalist.  ____________________________________________   FINAL CLINICAL IMPRESSION(S) / ED DIAGNOSES  Right hip fracture Cheral Marker, MD 05/03/15 [id number]

## 2015-05-03 NOTE — Progress Notes (Addendum)
New Admission Note:   Arrival Method: per bed from ED, pt is a resident of [facility] Mental Orientation: alert and oriented X4 Telemetry: none ordered Assessment: Completed Skin: warm, dry, with 2 abrasions on the left lower leg sustained from fall (pls see assessment flowsheet), cleansed and foam dressing applied. Sacral foam protocol initiated. IV: Gauge 18 on the left arm with transparent dressing. Pain: Pt stated, '10' out of 0-10 scale. MD paged for pain medicine. Tubes: Foley catheter fr.14 to drainage bag as peri-op order. Safety Measures: Safety Fall Prevention Plan and call bell use has been given and discussed with pt. Admission: Completed [facility] Orientation: Patient has been orientated to the room, unit and staff.  Family: Children at the bedside, will come back in the morning.  Orders have been reviewed and implemented. Will continue to monitor the patient. Call light has been placed within reach and bed alarm has been activated.   Georgeanna Harrison BSN, RN [facility]

## 2015-05-03 NOTE — ED Notes (Addendum)
Pt positioned in bed for comfort; sandwich tray and 2% milk given to pt at request; family at bedside assisting pt and updated on plan of care

## 2015-05-03 NOTE — ED Notes (Addendum)
Patient arrived by EMS from [facility]. Patient was trying to get up unassisted and tripped and fell on R hip. Patient has R hip pain. EMS gave 86mcg fentanyl. Patient A&O X4. Denies hitting head

## 2015-05-03 NOTE — ED Notes (Signed)
Pt resting. Family with pt

## 2015-05-04 ENCOUNTER — Inpatient Hospital Stay: Admitting: Anesthesiology

## 2015-05-04 ENCOUNTER — Inpatient Hospital Stay

## 2015-05-04 ENCOUNTER — Encounter: Payer: Self-pay | Admitting: Anesthesiology

## 2015-05-04 ENCOUNTER — Encounter
Admission: EM | Disposition: A | Discharge status: Skilled Nursing Facility | Payer: Self-pay | Source: Home / Self Care | Attending: Internal Medicine

## 2015-05-04 HISTORY — PX: INTRAMEDULLARY (IM) NAIL INTERTROCHANTERIC: SHX5875

## 2015-05-04 LAB — BASIC METABOLIC PANEL
ANION GAP: 6 (ref 5–15)
BUN: 31 mg/dL — ABNORMAL HIGH (ref 6–20)
CHLORIDE: 92 mmol/L — AB (ref 101–111)
CO2: 32 mmol/L (ref 22–32)
Calcium: 8.9 mg/dL (ref 8.9–10.3)
Creatinine, Ser: 0.9 mg/dL (ref 0.44–1.00)
GFR calc Af Amer: >60 mL/min (ref >60)
GFR calc non Af Amer: 54 mL/min — ABNORMAL LOW (ref >60)
Glucose, Bld: 164 mg/dL — ABNORMAL HIGH (ref 65–99)
POTASSIUM: 4.3 mmol/L (ref 3.5–5.1)
Sodium: 130 mmol/L — ABNORMAL LOW (ref 135–145)

## 2015-05-04 LAB — GLUCOSE, CAPILLARY
GLUCOSE-CAPILLARY: 167 mg/dL — AB (ref 65–99)
GLUCOSE-CAPILLARY: 144 mg/dL — AB (ref 65–99)
Glucose-Capillary: 114 mg/dL — ABNORMAL HIGH (ref 65–99)
Glucose-Capillary: 133 mg/dL — ABNORMAL HIGH (ref 65–99)
Glucose-Capillary: 119 mg/dL — ABNORMAL HIGH (ref 65–99)
Glucose-Capillary: 169 mg/dL — ABNORMAL HIGH (ref 65–99)

## 2015-05-04 LAB — CBC
HEMATOCRIT: 25.8 % — AB (ref 35.0–47.0)
HEMOGLOBIN: 8.6 g/dL — AB (ref 12.0–16.0)
MCH: 28.2 pg (ref 26.0–34.0)
MCHC: 33.2 g/dL (ref 32.0–36.0)
MCV: 84.8 fL (ref 80.0–100.0)
Platelets: 298 10*3/uL (ref 150–440)
RBC: 3.05 MIL/uL — AB (ref 3.80–5.20)
RDW: 13.5 % (ref 11.5–14.5)
WBC: 14.4 10*3/uL — ABNORMAL HIGH (ref 3.6–11.0)

## 2015-05-04 SURGERY — FIXATION, FRACTURE, INTERTROCHANTERIC, WITH INTRAMEDULLARY ROD
Anesthesia: Spinal | Site: Hip | Laterality: Right | Wound class: Clean

## 2015-05-04 MED ORDER — OXYCODONE HCL 5 MG PO TABS
5.0000 mg | ORAL_TABLET | ORAL | Status: DC | PRN
  Administered 2015-05-04 – 2015-05-07 (×2): 5 mg via ORAL
  Filled 2015-05-04 (×2): qty 1

## 2015-05-04 MED ORDER — BUPIVACAINE-EPINEPHRINE (PF) 0.5% -1:200000 IJ SOLN
INTRAMUSCULAR | Status: DC | PRN
  Administered 2015-05-04: 30 mL via PERINEURAL

## 2015-05-04 MED ORDER — DOCUSATE SODIUM 100 MG PO CAPS
100.0000 mg | ORAL_CAPSULE | Freq: Two times a day (BID) | ORAL | Status: DC
  Administered 2015-05-04 – 2015-05-08 (×8): 100 mg via ORAL
  Filled 2015-05-04 (×8): qty 1

## 2015-05-04 MED ORDER — METOCLOPRAMIDE HCL 5 MG/ML IJ SOLN: 5.0000 mg | Freq: Three times a day (TID) | INTRAMUSCULAR | Status: DC | PRN

## 2015-05-04 MED ORDER — PROPOFOL 10 MG/ML IV BOLUS
INTRAVENOUS | Status: DC | PRN
  Administered 2015-05-04: 5 mg via INTRAVENOUS

## 2015-05-04 MED ORDER — CEFAZOLIN SODIUM-DEXTROSE 2-3 GM-% IV SOLR
INTRAVENOUS | Status: DC | PRN
  Administered 2015-05-04: 2 g via INTRAVENOUS

## 2015-05-04 MED ORDER — BUPIVACAINE HCL (PF) 0.5 % IJ SOLN
INTRAMUSCULAR | Status: DC | PRN
  Administered 2015-05-04: 3 mL

## 2015-05-04 MED ORDER — PHENYLEPHRINE HCL 10 MG/ML IJ SOLN
INTRAMUSCULAR | Status: DC | PRN
  Administered 2015-05-04 (×3): 100 ug via INTRAVENOUS

## 2015-05-04 MED ORDER — KETAMINE HCL 50 MG/ML IJ SOLN
INTRAMUSCULAR | Status: DC | PRN
  Administered 2015-05-04: 25 mg via INTRAMUSCULAR

## 2015-05-04 MED ORDER — FLEET ENEMA 7-19 GM/118ML RE ENEM: 1.0000 | ENEMA | Freq: Once | RECTAL | Status: DC | PRN

## 2015-05-04 MED ORDER — FENTANYL CITRATE (PF) 100 MCG/2ML IJ SOLN
INTRAMUSCULAR | Status: DC | PRN
  Administered 2015-05-04: 25 ug via INTRAVENOUS

## 2015-05-04 MED ORDER — KCL IN DEXTROSE-NACL 20-5-0.9 MEQ/L-%-% IV SOLN
INTRAVENOUS | Status: DC
  Administered 2015-05-04: 50 mL/h via INTRAVENOUS
  Administered 2015-05-06: via INTRAVENOUS
  Filled 2015-05-04 (×6): qty 1000

## 2015-05-04 MED ORDER — ONDANSETRON HCL 4 MG/2ML IJ SOLN: 4.0000 mg | Freq: Once | INTRAMUSCULAR | Status: DC | PRN

## 2015-05-04 MED ORDER — METOCLOPRAMIDE HCL 10 MG PO TABS: 5.0000 mg | ORAL_TABLET | Freq: Three times a day (TID) | ORAL | Status: DC | PRN

## 2015-05-04 MED ORDER — HYDROMORPHONE HCL 1 MG/ML IJ SOLN: 0.5000 mg | INTRAMUSCULAR | Status: DC | PRN

## 2015-05-04 MED ORDER — BISACODYL 10 MG RE SUPP: 10.0000 mg | Freq: Every day | RECTAL | Status: DC | PRN

## 2015-05-04 MED ORDER — PROPOFOL 500 MG/50ML IV EMUL
INTRAVENOUS | Status: DC | PRN
  Administered 2015-05-04: 40 ug/kg/min via INTRAVENOUS

## 2015-05-04 MED ORDER — MAGNESIUM HYDROXIDE 400 MG/5ML PO SUSP
30.0000 mL | Freq: Every day | ORAL | Status: DC | PRN
  Administered 2015-05-05: 30 mL via ORAL
  Filled 2015-05-04: qty 30

## 2015-05-04 MED ORDER — MORPHINE SULFATE (PF) 4 MG/ML IV SOLN
4.0000 mg | INTRAVENOUS | Status: DC | PRN
  Administered 2015-05-04 – 2015-05-05 (×2): 4 mg via INTRAVENOUS
  Filled 2015-05-04 (×2): qty 1

## 2015-05-04 MED ORDER — NEOMYCIN-POLYMYXIN B GU 40-200000 IR SOLN
Status: DC | PRN
  Administered 2015-05-04: 2 mL

## 2015-05-04 MED ORDER — ACETAMINOPHEN 325 MG PO TABS
650.0000 mg | ORAL_TABLET | Freq: Four times a day (QID) | ORAL | Status: DC | PRN
  Administered 2015-05-05 – 2015-05-07 (×4): 650 mg via ORAL
  Filled 2015-05-04 (×4): qty 2

## 2015-05-04 MED ORDER — FENTANYL CITRATE (PF) 100 MCG/2ML IJ SOLN: 25.0000 ug | INTRAMUSCULAR | Status: DC | PRN

## 2015-05-04 MED ORDER — ACETAMINOPHEN 650 MG RE SUPP: 650.0000 mg | Freq: Four times a day (QID) | RECTAL | Status: DC | PRN

## 2015-05-04 MED ORDER — EPHEDRINE SULFATE 50 MG/ML IJ SOLN
INTRAMUSCULAR | Status: DC | PRN
  Administered 2015-05-04: 15 mg via INTRAVENOUS
  Administered 2015-05-04 (×2): 10 mg via INTRAVENOUS

## 2015-05-04 MED ORDER — ENOXAPARIN SODIUM 40 MG/0.4ML ~~LOC~~ SOLN
40.0000 mg | SUBCUTANEOUS | Status: DC
  Administered 2015-05-05 – 2015-05-08 (×4): 40 mg via SUBCUTANEOUS
  Filled 2015-05-04 (×4): qty 0.4

## 2015-05-04 MED ORDER — CEFAZOLIN SODIUM-DEXTROSE 2-4 GM/100ML-% IV SOLN
2.0000 g | Freq: Four times a day (QID) | INTRAVENOUS | Status: AC
  Administered 2015-05-04 – 2015-05-05 (×3): 2 g via INTRAVENOUS
  Filled 2015-05-04 (×3): qty 100

## 2015-05-04 MED ORDER — DIPHENHYDRAMINE HCL 12.5 MG/5ML PO ELIX: 12.5000 mg | ORAL_SOLUTION | ORAL | Status: DC | PRN

## 2015-05-04 SURGICAL SUPPLY — 43 items
BIT DRILL 4.3MMS DISTAL GRDTED (BIT) ×1 IMPLANT
BNDG COHESIVE 4X5 TAN STRL (GAUZE/BANDAGES/DRESSINGS) ×3 IMPLANT
BNDG COHESIVE 6X5 TAN STRL LF (GAUZE/BANDAGES/DRESSINGS) ×3 IMPLANT
CANISTER SUCT 1200ML W/VALVE (MISCELLANEOUS) ×3 IMPLANT
CHLORAPREP W/TINT 26ML (MISCELLANEOUS) ×6 IMPLANT
DRAPE C-ARMOR (DRAPES) ×3 IMPLANT
DRAPE SHEET LG 3/4 BI-LAMINATE (DRAPES) ×3 IMPLANT
DRAPE SURG 17X11 SM STRL (DRAPES) ×3 IMPLANT
DRAPE U-SHAPE 47X51 STRL (DRAPES) ×3 IMPLANT
DRILL 4.3MMS DISTAL GRADUATED (BIT) ×3
DRSG OPSITE POSTOP 3X4 (GAUZE/BANDAGES/DRESSINGS) ×9 IMPLANT
DRSG OPSITE POSTOP 4X6 (GAUZE/BANDAGES/DRESSINGS) ×3 IMPLANT
ELECT CAUTERY BLADE 6.4 (BLADE) ×3 IMPLANT
ELECT REM PT RETURN 9FT ADLT (ELECTROSURGICAL) ×3
ELECTRODE REM PT RTRN 9FT ADLT (ELECTROSURGICAL) ×1 IMPLANT
GAUZE SPONGE 4X4 12PLY STRL (GAUZE/BANDAGES/DRESSINGS) ×3 IMPLANT
GLOVE BIO SURGEON STRL SZ8 (GLOVE) ×6 IMPLANT
GLOVE INDICATOR 8.0 STRL GRN (GLOVE) ×3 IMPLANT
GOWN STRL REUS W/ TWL LRG LVL3 (GOWN DISPOSABLE) ×1 IMPLANT
GOWN STRL REUS W/ TWL XL LVL3 (GOWN DISPOSABLE) ×1 IMPLANT
GOWN STRL REUS W/TWL LRG LVL3 (GOWN DISPOSABLE) ×2
GOWN STRL REUS W/TWL XL LVL3 (GOWN DISPOSABLE) ×2
GUIDEPIN VERSANAIL DSP 3.2X444 ×3 IMPLANT
GUIDEWIRE BALL NOSE 80CM (WIRE) ×3 IMPLANT
HFN RH 130 DEG 11MM X 360MM (Orthopedic Implant) ×3 IMPLANT
MAT BLUE FLOOR 46X72 FLO (MISCELLANEOUS) ×3 IMPLANT
NEEDLE FILTER BLUNT 18X 1/2SAF (NEEDLE) ×2
NEEDLE FILTER BLUNT 18X1 1/2 (NEEDLE) ×1 IMPLANT
NEEDLE HYPO 22GX1.5 SAFETY (NEEDLE) ×3 IMPLANT
NS IRRIG 500ML POUR BTL (IV SOLUTION) ×3 IMPLANT
PACK HIP COMPR (MISCELLANEOUS) ×3 IMPLANT
SCREW BONE CORTICAL 5.0X44 (Screw) ×3 IMPLANT
SCREW LAG HIP NAIL 10.5X95 (Screw) ×3 IMPLANT
STAPLER SKIN PROX 35W (STAPLE) ×3 IMPLANT
STRAP SAFETY BODY (MISCELLANEOUS) ×3 IMPLANT
SUT VIC AB 1 CT1 36 (SUTURE) ×3 IMPLANT
SUT VIC AB 2-0 CT1 (SUTURE) ×3 IMPLANT
SUT VIC AB 2-0 CT1 27 (SUTURE) ×2
SUT VIC AB 2-0 CT1 TAPERPNT 27 (SUTURE) ×1 IMPLANT
SYR 30ML LL (SYRINGE) ×3 IMPLANT
SYRINGE 10CC LL (SYRINGE) ×3 IMPLANT
TAPE MICROFOAM 4IN (TAPE) ×3 IMPLANT
THREADED GUIDE PIN ×3 IMPLANT

## 2015-05-04 NOTE — Consult Note (Signed)
Reason for Consult: Preoperative clearance for orthopedic surgery/atrial fibrillation Referring Physician: Dr. Roland Rack, Drs. making house calls  Amanda Stark is an 82 y.o. female.  HPI: Patient is preop for hip surgery shows history of atrial fibrillation rhythm control with sotalol rate control with metoprolol she's not on anticoagulation has seen Dr. Ubaldo Glassing several years ago now lives at home place and is followed by Drs. making house calls. Patient reportedly got up yesterday from her wheelchair and fell. Patient injured her right hip and was brought to the emergency room for evaluation and is now preop for hip surgery. Patient denies any other cardiac complaints she's had a nonhealing left leg wound denies any chest pain. EKG in the chart currently sinus rhythm apparently controlled by sotalol.  Past Medical History  Diagnosis Date  . SVT (supraventricular tachycardia) ([place])   . Thyroid disease   . Instability of right shoulder joint   . Glenohumeral arthritis   . Diabetes mellitus without complication ([place])   . Hypertension   . Hyperlipidemia   . PMR (polymyalgia rheumatica) (HCC)   . Lumbar spinal stenosis   . Osteoporosis   . Thyroid cancer ([place])   . Esophageal cancer ([place])   . Vertigo     Past Surgical History  Procedure Laterality Date  . Total thyroidectomy    . Kidney stone removal    . Right lens implant    . Appendectomy    . Abdominal hysterectomy    . Thyroidectomy    . Back surgery      Family History  Problem Relation Age of Onset  . Cancer Mother   . Cancer Sister     Social History:  reports that she has never smoked. She has never used smokeless tobacco. She reports that she does not drink alcohol or use illicit drugs.  Allergies:  Allergies  Allergen Reactions  . Ascriptin A-D [Aspirin Buf(Alhyd-Mghyd-Cacar)] Other (See Comments)    Reaction:  Unknown   . Aspirin Other (See Comments)    Reaction:  Unknown   . Codeine Other (See Comments)   Reaction:  Unknown   . Hydrocodone Other (See Comments)    Reaction:  Unknown   . Statins Other (See Comments)    Reaction:  Muscle pain    . Tramadol Nausea And Vomiting    Medications: I have reviewed the patient's current medications.  Results for orders placed or performed during the hospital encounter of 05/03/15 (from the past 48 hour(s))  Urinalysis complete, with microscopic ([facility] only)     Status: Abnormal   Collection Time: 05/03/15  4:48 PM  Result Value Ref Range   Color, Urine YELLOW (A) YELLOW   APPearance CLEAR (A) CLEAR   Glucose, UA 50 (A) NEGATIVE mg/dL   Bilirubin Urine NEGATIVE NEGATIVE   Ketones, ur NEGATIVE NEGATIVE mg/dL   Specific Gravity, Urine 1.012 1.005 - 1.030   Hgb urine dipstick NEGATIVE NEGATIVE   pH 7.0 5.0 - 8.0   Protein, ur 100 (A) NEGATIVE mg/dL   Nitrite NEGATIVE NEGATIVE   Leukocytes, UA NEGATIVE NEGATIVE   RBC / HPF 6-30 0 - 5 RBC/hpf   WBC, UA 0-5 0 - 5 WBC/hpf   Bacteria, UA NONE SEEN NONE SEEN   Squamous Epithelial / LPF 0-5 (A) NONE SEEN   Mucous PRESENT   CBC     Status: Abnormal   Collection Time: 05/03/15  5:10 PM  Result Value Ref Range   WBC 13.6 (H) 3.6 - 11.0 K/uL  RBC 3.92 3.80 - 5.20 MIL/uL   Hemoglobin 10.9 (L) 12.0 - 16.0 g/dL   HCT 32.5 (L) 35.0 - 47.0 %   MCV 82.8 80.0 - 100.0 fL   MCH 27.8 26.0 - 34.0 pg   MCHC 33.6 32.0 - 36.0 g/dL   RDW 13.4 11.5 - 14.5 %   Platelets 371 150 - 440 K/uL  Basic metabolic panel     Status: Abnormal   Collection Time: 05/03/15  5:10 PM  Result Value Ref Range   Sodium 127 (L) 135 - 145 mmol/L   Potassium 4.1 3.5 - 5.1 mmol/L   Chloride 88 (L) 101 - 111 mmol/L   CO2 29 22 - 32 mmol/L   Glucose, Bld 193 (H) 65 - 99 mg/dL   BUN 23 (H) 6 - 20 mg/dL   Creatinine, Ser 0.77 0.44 - 1.00 mg/dL   Calcium 9.3 8.9 - 10.3 mg/dL   GFR calc non Af Amer >60 >60 mL/min   GFR calc Af Amer >60 >60 mL/min    Comment: (NOTE) The eGFR has been calculated using the CKD EPI equation. This  calculation has not been validated in all clinical situations. eGFR's persistently <60 mL/min signify possible Chronic Kidney Disease.    Anion gap 10 5 - 15  Type and screen [facility]     Status: None   Collection Time: 05/03/15  5:10 PM  Result Value Ref Range   ABO/RH(D) A POS    Antibody Screen NEG    Sample Expiration 05/06/2015   ABO/Rh     Status: None   Collection Time: 05/03/15  5:11 PM  Result Value Ref Range   ABO/RH(D) A POS   Glucose, capillary     Status: Abnormal   Collection Time: 05/03/15  7:17 PM  Result Value Ref Range   Glucose-Capillary 154 (H) 65 - 99 mg/dL   Comment 1 Notify RN    Comment 2 Document in Chart   Surgical pcr screen     Status: None   Collection Time: 05/03/15  8:46 PM  Result Value Ref Range   MRSA, PCR NEGATIVE NEGATIVE   Staphylococcus aureus NEGATIVE NEGATIVE    Comment:        The Xpert SA Assay (FDA approved for NASAL specimens in patients over 57 years of age), is one component of a comprehensive surveillance program.  Test performance has been validated by [facility] for patients greater than or equal to 38 year old. It is not intended to diagnose infection nor to guide or monitor treatment.   Glucose, capillary     Status: Abnormal   Collection Time: 05/03/15  8:51 PM  Result Value Ref Range   Glucose-Capillary 183 (H) 65 - 99 mg/dL  Basic metabolic panel     Status: Abnormal   Collection Time: 05/04/15  4:44 AM  Result Value Ref Range   Sodium 130 (L) 135 - 145 mmol/L   Potassium 4.3 3.5 - 5.1 mmol/L   Chloride 92 (L) 101 - 111 mmol/L   CO2 32 22 - 32 mmol/L   Glucose, Bld 164 (H) 65 - 99 mg/dL   BUN 31 (H) 6 - 20 mg/dL   Creatinine, Ser 0.90 0.44 - 1.00 mg/dL   Calcium 8.9 8.9 - 10.3 mg/dL   GFR calc non Af Amer 54 (L) >60 mL/min   GFR calc Af Amer >60 >60 mL/min    Comment: (NOTE) The eGFR has been calculated using the CKD EPI equation.  This calculation has not been validated in all  clinical situations. eGFR's persistently <60 mL/min signify possible Chronic Kidney Disease.    Anion gap 6 5 - 15  CBC     Status: Abnormal   Collection Time: 05/04/15  4:44 AM  Result Value Ref Range   WBC 14.4 (H) 3.6 - 11.0 K/uL   RBC 3.05 (L) 3.80 - 5.20 MIL/uL   Hemoglobin 8.6 (L) 12.0 - 16.0 g/dL   HCT 25.8 (L) 35.0 - 47.0 %   MCV 84.8 80.0 - 100.0 fL   MCH 28.2 26.0 - 34.0 pg   MCHC 33.2 32.0 - 36.0 g/dL   RDW 13.5 11.5 - 14.5 %   Platelets 298 150 - 440 K/uL  Glucose, capillary     Status: Abnormal   Collection Time: 05/04/15  8:02 AM  Result Value Ref Range   Glucose-Capillary 167 (H) 65 - 99 mg/dL   Comment 1 Notify RN   Glucose, capillary     Status: Abnormal   Collection Time: 05/04/15 12:12 PM  Result Value Ref Range   Glucose-Capillary 114 (H) 65 - 99 mg/dL   Comment 1 Notify RN     Dg Chest 1 View  05/03/2015  CLINICAL DATA:  Fall today on right side with right hip fracture EXAM: CHEST 1 VIEW COMPARISON:  01/29/2015 FINDINGS: Cardiac shadow is again enlarged. A hiatal hernia is again seen and stable. Aortic calcifications are noted. The lungs are well-aerated without focal infiltrate. Multiple old rib fractures on the right are seen with chest wall deformity. IMPRESSION: Chronic changes without acute abnormality. Electronically Signed   By: Inez Catalina M.D.   On: 05/03/2015 18:07   Dg Hip Unilat With Pelvis 2-3 Views Right  05/03/2015  CLINICAL DATA:  Fall today with right hip pain.  Initial encounter. EXAM: DG HIP (WITH OR WITHOUT PELVIS) 2-3V RIGHT COMPARISON:  None. FINDINGS: Acute fracture of the proximal right femur present through the intratrochanteric region. This fracture shows mild displacement. No dislocation. No other fractures are identified. No bony lesions are seen. Vascular calcifications are seen in the femoral arteries bilaterally. IMPRESSION: Acute intratrochanteric fracture of the right hip with mild displacement. Electronically Signed   By: Aletta Edouard M.D.   On: 05/03/2015 18:07    Review of Systems  Constitutional: Positive for malaise/fatigue.  HENT: Negative.   Eyes: Negative.   Respiratory: Negative.   Cardiovascular: Negative.   Gastrointestinal: Negative.   Genitourinary: Negative.   Musculoskeletal: Positive for falls.  Skin: Negative.   Neurological: Positive for weakness.  Endo/Heme/Allergies: Negative.   Psychiatric/Behavioral: Negative.    Blood pressure 126/49, pulse 75, temperature 98.7 F (37.1 C), temperature source Axillary, resp. rate 18, height 5' 3"  (1.6 m), weight 47.174 kg (104 lb), SpO2 98 %. Physical Exam  Nursing note and vitals reviewed. Constitutional: She is oriented to person, place, and time. She appears well-developed.  HENT:  Head: Normocephalic and atraumatic.  Eyes: Conjunctivae and EOM are normal.  Neck: Normal range of motion.  Cardiovascular: Normal rate and regular rhythm.   Murmur heard. Respiratory: Effort normal and breath sounds normal.  GI: Soft. Bowel sounds are normal.  Musculoskeletal:       Right hip: She exhibits decreased range of motion, decreased strength and tenderness.       Legs: Nonhealing: Of left leg  Neurological: She is alert and oriented to person, place, and time. She has normal reflexes.  Skin: Skin is warm and dry.  Psychiatric: She  has a normal mood and affect.    Assessment/Plan: Atrial fibrillation History of SVT Right hip fracture Preop for right hip surgery Hypertension Hyponatremia Anemia Diabetes type 2 uncomplicated with nonhealing wound Left leg wound Dementia Vertigo Thyroid disease GERD . PLAN Patient is an acceptable surgical risk for hip surgery Patient appears to be a moderate risk because of comorbid disease Continue sotalol and metoprolol for control of atrial fibrillation Do not recommend long-term anticoagulation because of fall risk Continue insulin therapy with NovoLog for diabetes control Maintain Aricept for mild  dementia Hypertension control with amlodipine and metoprolol Maintain Synthroid for thyroid disease Agree with inhalers for shortness of breath Meclizine when necessary vertigo We'll continue to follow until discharge Case discussed with the family as well as Dr. Roland Rack surgeon Continue sotalol and metoprolol pre-and postop Agree with Protonix therapy for reflux symptoms   CALLWOOD,DWAYNE D. 05/04/2015, 12:57 PM

## 2015-05-04 NOTE — Care Management (Addendum)
Dr. Roland Rack was not aware that patient was followed by hospice and will discuss surgery with patient/ family. Family wishes to proceed with surgery.

## 2015-05-04 NOTE — Anesthesia Procedure Notes (Signed)
Spinal Patient location during procedure: OR Start time: 05/04/2015 2:03 PM End time: 05/04/2015 2:05 PM Staffing Resident/CRNA: Doreen Salvage Preanesthetic Checklist Completed: patient identified, site marked, surgical consent, pre-op evaluation, timeout performed, IV checked, risks and benefits discussed and monitors and equipment checked Spinal Block Patient position: sitting Prep: Betadine Patient monitoring: heart rate, continuous pulse ox, blood pressure and cardiac monitor Approach: midline Location: L3-4 Injection technique: single-shot Needle Needle type: [place]acre and Introducer  Needle gauge: 24 G Needle length: 9 cm Additional Notes Negative paresthesia. Negative blood return. Positive free-flowing CSF. Expiration date of kit checked and confirmed. Patient tolerated procedure well, without complications.

## 2015-05-04 NOTE — Clinical Social Work Placement (Signed)
   CLINICAL SOCIAL WORK PLACEMENT  NOTE  Date:  05/04/2015  Patient Details  Name: Amanda Stark MRN: [id number] Date of Birth: 11-22-1922  Clinical Social Work is seeking post-discharge placement for this patient at the [place] level of care (*CSW will initial, date and re-position this form in  chart as items are completed):  Yes   Patient/family provided with [place] Work Department's list of facilities offering this level of care within the geographic area requested by the patient (or if unable, by the patient's family).  Yes   Patient/family informed of their freedom to choose among providers that offer the needed level of care, that participate in Medicare, Medicaid or managed care program needed by the patient, have an available bed and are willing to accept the patient.  Yes   Patient/family informed of [facility]'s ownership interest in [facility] and [facility], as well as of the fact that they are under no obligation to receive care at these facilities.  PASRR submitted to EDS on       PASRR number received on       Existing PASRR number confirmed on 05/04/15     FL2 transmitted to all facilities in geographic area requested by pt/family on 05/04/15     FL2 transmitted to all facilities within larger geographic area on       Patient informed that his/her managed care company has contracts with or will negotiate with certain facilities, including the following:            Patient/family informed of bed offers received.  Patient chooses bed at       Physician recommends and patient chooses bed at      Patient to be transferred to   on  .  Patient to be transferred to facility by       Patient family notified on   of transfer.  Name of family member notified:        PHYSICIAN       Additional Comment:    _______________________________________________ Loralyn Freshwater, LCSW 05/04/2015, 1:42 PM

## 2015-05-04 NOTE — Progress Notes (Addendum)
Patient ID: Amanda Stark, female   DOB: [birth year], 82 y.o.   MRN: [id number] [facility] NAME: Amanda Stark    MR#:  [id number]  DATE OF BIRTH:  1922-02-15  SUBJECTIVE:  Came in with hip pain after fall. Found to have fracture  REVIEW OF SYSTEMS:   Review of Systems  Unable to perform ROS: acuity of condition   Tolerating Diet:npo for surgery Tolerating PT: pending  DRUG ALLERGIES:   Allergies  Allergen Reactions  . Ascriptin A-D [Aspirin Buf(Alhyd-Mghyd-Cacar)] Other (See Comments)    Reaction:  Unknown   . Aspirin Other (See Comments)    Reaction:  Unknown   . Codeine Other (See Comments)    Reaction:  Unknown   . Hydrocodone Other (See Comments)    Reaction:  Unknown   . Statins Other (See Comments)    Reaction:  Muscle pain    . Tramadol Nausea And Vomiting    VITALS:  Blood pressure 150/61, pulse 72, temperature 97.2 F (36.2 C), temperature source Tympanic, resp. rate 15, height 5\' 3"  (1.6 m), weight 47.174 kg (104 lb), SpO2 100 %.  PHYSICAL EXAMINATION:   Physical Exam  GENERAL:  82 y.o.-year-old patient lying in the bed with no acute distress.  EYES: Pupils equal, round, reactive to light and accommodation. No scleral icterus. Extraocular muscles intact.  HEENT: Head atraumatic, normocephalic. Oropharynx and nasopharynx clear.  NECK:  Supple, no jugular venous distention. No thyroid enlargement, no tenderness.  LUNGS: Normal breath sounds bilaterally, no wheezing, rales, rhonchi. No use of accessory muscles of respiration.  CARDIOVASCULAR: S1, S2 normal. No murmurs, rubs, or gallops.  ABDOMEN: Soft, nontender, nondistended. Bowel sounds present. No organomegaly or mass.  EXTREMITIES: No cyanosis, clubbing or edema b/l.   rigth leg externally rotated NEUROLOGIC: Cranial nerves II through XII are intact. No focal Motor or sensory deficits b/l.   PSYCHIATRIC:  patient is alert and awake SKIN: No  obvious rash, lesion, or ulcer.   LABORATORY PANEL:  CBC  Recent Labs Lab 05/04/15 0444  WBC 14.4*  HGB 8.6*  HCT 25.8*  PLT 298    Chemistries   Recent Labs Lab 05/04/15 0444  NA 130*  K 4.3  CL 92*  CO2 32  GLUCOSE 164*  BUN 31*  CREATININE 0.90  CALCIUM 8.9   Cardiac Enzymes No results for input(s): TROPONINI in the last 168 hours. RADIOLOGY:  Dg Chest 1 View  05/03/2015  CLINICAL DATA:  Fall today on right side with right hip fracture EXAM: CHEST 1 VIEW COMPARISON:  01/29/2015 FINDINGS: Cardiac shadow is again enlarged. A hiatal hernia is again seen and stable. Aortic calcifications are noted. The lungs are well-aerated without focal infiltrate. Multiple old rib fractures on the right are seen with chest wall deformity. IMPRESSION: Chronic changes without acute abnormality. Electronically Signed   By: Inez Catalina M.D.   On: 05/03/2015 18:07   Dg Hip Operative Unilat W Or W/o Pelvis Right  05/04/2015  CLINICAL DATA:  ORIF right femur EXAM: OPERATIVE right HIP (WITH PELVIS IF PERFORMED) 4 VIEWS TECHNIQUE: Fluoroscopic spot image(s) were submitted for interpretation post-operatively. COMPARISON:  05/03/2015 FINDINGS: Four views show gamma nail fixation of a trochanteric region fracture on the right. Components appear well positioned. No radiographically detectable complication. IMPRESSION: ORIF right trochanteric region fracture. Electronically Signed   By: Nelson Chimes M.D.   On: 05/04/2015 15:45   Dg Hip Unilat With Pelvis 2-3 Views Right  05/03/2015  CLINICAL DATA:  Fall today with right hip pain.  Initial encounter. EXAM: DG HIP (WITH OR WITHOUT PELVIS) 2-3V RIGHT COMPARISON:  None. FINDINGS: Acute fracture of the proximal right femur present through the intratrochanteric region. This fracture shows mild displacement. No dislocation. No other fractures are identified. No bony lesions are seen. Vascular calcifications are seen in the femoral arteries bilaterally. IMPRESSION:  Acute intratrochanteric fracture of the right hip with mild displacement. Electronically Signed   By: Aletta Edouard M.D.   On: 05/03/2015 18:07   ASSESSMENT AND PLAN:  Amanda Stark is a 82 y.o. female with a known history of Hypertension, hyperlipidemia and A. fib. The patient has had the poor oral intake and generalized weakness for the past a few days. She fell to the floor in nursing home today. She complained of right hip pain, unable to move  1.Right hip fracture - She has moderate to high risk for hip surgery -Pt cleared by  cardiology  for pre-operation clearance. Hold Mobic. -Pain control - PT and DVT prophylaxis after surgery. -appreciate Dr Nicholaus Bloom input  2.Hyponatremia and dehydration. Start normal saline IV and follow-up BMP.  3.Leukocytosis. No source of infection noted  4.Diabetes. Hold Levemir and metformin, and start sliding scale.  5.Hypertension. Continue Norvasc and Lopressor.  6.History of A. fib. Continue sotalol and Lopressor.  7.Anemia of chronic disease. Stable.  Case discussed with Care Management/Social Worker. Management plans discussed with the patient, family and they are in agreement.  CODE STATUS: dnr  DVT Prophylaxis: per ortho  TOTAL TIME TAKING CARE OF THIS PATIENT: 30 minutes.  >50% time spent on counselling and coordination of care family, dr Roland Rack  POSSIBLE D/C IN 1-2 DAYS, DEPENDING ON CLINICAL CONDITION.  Note: This dictation was prepared with Dragon dictation along with smaller phrase technology. Any transcriptional errors that result from this process are unintentional.  Amanda Stark M.D on 05/04/2015 at 4:19 PM  Between 7am to 6pm - Pager - [phone]  After 6pm go to [email or web address] - password [email or web address] [facility]  Office  [phone]  CC: Primary care physician; Pcp Not In System

## 2015-05-04 NOTE — NC FL2 (Signed)
[place] LEVEL OF CARE SCREENING TOOL     IDENTIFICATION  Patient Name: Amanda Stark Birthdate: 1922/05/27 Sex: female Admission Date (Current Location): 05/03/2015  [place] and [place] Number:  Engineering geologist and Address:  [facility], [address] [place], [place] [address]      Provider Number: [id number]  Attending Physician Name and Address:  Fritzi Mandes, MD  Relative Name and Phone Number:       Current Level of Care: Hospital Recommended Level of Care: [place] Prior Approval Number:    Date Approved/Denied:   PASRR Number:  [id number])  Discharge Plan: SNF    Current Diagnoses: Patient Active Problem List   Diagnosis Date Noted  . Closed right hip fracture ([place]) 05/03/2015  . Atrial fibrillation, rapid ([place]) 11/20/2014  . SVT (supraventricular tachycardia) ([place]) 08/02/2014  . Hyponatremia 08/02/2014    Orientation RESPIRATION BLADDER Height & Weight     Self, Time, Situation, Place  Normal Continent Weight: 104 lb (47.174 kg) Height:  5\' 3"  (160 cm)  BEHAVIORAL SYMPTOMS/MOOD NEUROLOGICAL BOWEL NUTRITION STATUS   (none )  (none ) Continent Diet (NPO for surgery )  AMBULATORY STATUS COMMUNICATION OF NEEDS Skin   Extensive Assist Verbally Surgical wounds                       Personal Care Assistance Level of Assistance  Bathing, Feeding, Dressing Bathing Assistance: Limited assistance Feeding assistance: Independent Dressing Assistance: Limited assistance     Functional Limitations Info  Sight, Hearing, Speech Sight Info: Adequate Hearing Info: Adequate Speech Info: Adequate    SPECIAL CARE FACTORS FREQUENCY  PT (By licensed PT), OT (By licensed OT)     PT Frequency:  (5) OT Frequency:  (5)            Contractures      Additional Factors Info  Code Status, Allergies Code Status Info:  (DNR ) Allergies Info:  (Ascriptin A-d, Aspirin, Codeine, Hydrocodone,  Statins, Tramadol. )           Current Medications (05/04/2015):  This is the current hospital active medication list Current Facility-Administered Medications  Medication Dose Route Frequency Provider Last Rate Last Dose  . 0.9 %  sodium chloride infusion   Intravenous Continuous Demetrios Loll, MD 50 mL/hr at 05/03/15 2055 50 mL/hr at 05/03/15 2055  . acetaminophen (TYLENOL) tablet 650 mg  650 mg Oral Q6H PRN Demetrios Loll, MD       Or  . acetaminophen (TYLENOL) suppository 650 mg  650 mg Rectal Q6H PRN Demetrios Loll, MD      . albuterol (PROVENTIL) (2.5 MG/3ML) 0.083% nebulizer solution 2.5 mg  2.5 mg Nebulization Q2H PRN Demetrios Loll, MD      . amLODipine (NORVASC) tablet 5 mg  5 mg Oral Daily Demetrios Loll, MD   5 mg at 05/03/15 2158  . ceFAZolin (ANCEF) IVPB 2g/100 mL premix  2 g Intravenous 30 min Pre-Op Corky Mull, MD      . docusate sodium (COLACE) capsule 100 mg  100 mg Oral QHS Demetrios Loll, MD   100 mg at 05/03/15 2158  . donepezil (ARICEPT) tablet 5 mg  5 mg Oral QHS Demetrios Loll, MD   5 mg at 05/03/15 2157  . feeding supplement (GLUCERNA SHAKE) (GLUCERNA SHAKE) liquid 237 mL  237 mL Oral BID BM Demetrios Loll, MD   237 mL at 05/04/15 1000  . guaifenesin (ROBITUSSIN) 100  MG/5ML syrup 200 mg  200 mg Oral Q4H PRN Demetrios Loll, MD      . insulin aspart (novoLOG) injection 0-5 Units  0-5 Units Subcutaneous QHS Demetrios Loll, MD   0 Units at 05/03/15 2211  . insulin aspart (novoLOG) injection 0-9 Units  0-9 Units Subcutaneous TID WC Demetrios Loll, MD   2 Units at 05/04/15 [phone]  . levothyroxine (SYNTHROID, LEVOTHROID) tablet 137 mcg  137 mcg Oral QAC breakfast Demetrios Loll, MD   137 mcg at 05/04/15 0800  . LORazepam (ATIVAN) tablet 1 mg  1 mg Oral QHS Demetrios Loll, MD   1 mg at 05/03/15 2159  . meclizine (ANTIVERT) tablet 25 mg  25 mg Oral TID PRN Demetrios Loll, MD      . metoprolol (LOPRESSOR) tablet 50 mg  50 mg Oral BID Demetrios Loll, MD   50 mg at 05/04/15 0819  . morphine 4 MG/ML injection 4 mg  4 mg Intravenous Q4H PRN Lance Coon,  MD   4 mg at 05/03/15 2302  . ondansetron (ZOFRAN) tablet 4 mg  4 mg Oral Q6H PRN Demetrios Loll, MD       Or  . ondansetron [facility]) injection 4 mg  4 mg Intravenous Q6H PRN Demetrios Loll, MD   4 mg at 05/03/15 2203  . pantoprazole (PROTONIX) EC tablet 40 mg  40 mg Oral Daily Demetrios Loll, MD   40 mg at 05/03/15 2157  . polyethylene glycol (MIRALAX / GLYCOLAX) packet 17 g  17 g Oral Meliton Rattan, MD   17 g at 05/03/15 2159  . sotalol (BETAPACE) tablet 40 mg  40 mg Oral Q12H Demetrios Loll, MD   40 mg at 05/04/15 [id number]     Discharge Medications: Please see discharge summary for a list of discharge medications.  Relevant Imaging Results:  Relevant Lab Results:   Additional Information  (SSN: [id number])  Loralyn Freshwater, LCSW

## 2015-05-04 NOTE — Consult Note (Signed)
ORTHOPAEDIC CONSULTATION  REQUESTING PHYSICIAN: Fritzi Mandes, MD  Chief Complaint:   Right hip fracture.  History of Present Illness: Amanda Stark is a 82 y.o. female with multiple medical problems, including coronary artery disease, supraventricular tachycardia, hypertension, hyperlipidemia, and diabetes, who lives at home but is being tended to by hospice. Apparently, she sustained a deep laceration to her left lower leg several months ago which is being treated with daily dressing changes. Since that laceration, she has been wheelchair dependent and nonambulatory. Apparently, the patient decided to try to get up and walk to the bathroom. Yesterday evening when she fell and landed on her right hip and side. She was brought to the emergency room where x-rays demonstrated an intertrochanteric fracture of the right hip. She was admitted to the hospitalist service in preparation for possible surgical intervention. The patient denies any associated injuries. She did not strike her head or lose consciousness. She also denies any lightheadedness, dizziness, chest pain, or other symptoms that may have precipitated her fall.  Past Medical History  Diagnosis Date  . SVT (supraventricular tachycardia) ([place])   . Thyroid disease   . Instability of right shoulder joint   . Glenohumeral arthritis   . Diabetes mellitus without complication ([place])   . Hypertension   . Hyperlipidemia   . PMR (polymyalgia rheumatica) ([facility])   . Lumbar spinal stenosis   . Osteoporosis   . Thyroid cancer ([place])   . Esophageal cancer ([place])   . Vertigo    Past Surgical History  Procedure Laterality Date  . Total thyroidectomy    . Kidney stone removal    . Right lens implant    . Appendectomy    . Abdominal hysterectomy    . Thyroidectomy    . Back surgery     Social History   Social History  . Marital Status: Widowed    Spouse Name: N/A  . Number of  Children: N/A  . Years of Education: N/A   Social History Main Topics  . Smoking status: Never Smoker   . Smokeless tobacco: Never Used  . Alcohol Use: No  . Drug Use: No  . Sexual Activity: Not Currently   Other Topics Concern  . None   Social History Narrative   Family History  Problem Relation Age of Onset  . Cancer Mother   . Cancer Sister    Allergies  Allergen Reactions  . Ascriptin A-D [Aspirin Buf(Alhyd-Mghyd-Cacar)] Other (See Comments)    Reaction:  Unknown   . Aspirin Other (See Comments)    Reaction:  Unknown   . Codeine Other (See Comments)    Reaction:  Unknown   . Hydrocodone Other (See Comments)    Reaction:  Unknown   . Statins Other (See Comments)    Reaction:  Muscle pain    . Tramadol Nausea And Vomiting   Prior to Admission medications   Medication Sig Start Date End Date Taking? Authorizing Provider  acetaminophen (TYLENOL) 325 MG tablet Take 650 mg by mouth 3 (three) times daily.   Yes Historical Provider, MD  amLODipine (NORVASC) 5 MG tablet Take 5 mg by mouth daily.   Yes Historical Provider, MD  Calcium Carbonate-Vitamin D (CALCIUM 600+D) 600-400 MG-UNIT tablet Take 1 tablet by mouth 2 (two) times daily.   Yes Historical Provider, MD  Cholecalciferol (VITAMIN D3) 5000 UNITS TABS Take 5,000 Units by mouth at bedtime.    Yes Historical Provider, MD  docusate sodium (COLACE) 100 MG capsule Take 100 mg  by mouth at bedtime.   Yes Historical Provider, MD  donepezil (ARICEPT) 5 MG tablet Take 5 mg by mouth at bedtime.   Yes Historical Provider, MD  feeding supplement, GLUCERNA SHAKE, (GLUCERNA SHAKE) LIQD Take 237 mLs by mouth 2 (two) times daily between meals.   Yes Historical Provider, MD  glucagon (GLUCAGEN) 1 MG SOLR injection Inject 1 mg into the muscle once as needed for low blood sugar.   Yes Historical Provider, MD  guaifenesin (ROBITUSSIN) 100 MG/5ML syrup Take 200 mg by mouth every 4 (four) hours as needed for cough.    Yes Historical  Provider, MD  insulin detemir (LEVEMIR) 100 UNIT/ML injection Inject 4-10 Units into the skin 2 (two) times daily. Pt uses 10 units in the morning and 4 units at bedtime.   Yes Historical Provider, MD  insulin lispro (HUMALOG) 100 UNIT/ML injection Inject 1-5 Units into the skin 3 (three) times daily with meals as needed for high blood sugar. Pt uses as needed per sliding scale:    200-249:  1 unit  250-299:  2 units  300-349:  3 units  350-399:  4 units  Greater than 400:  5 units   Yes Historical Provider, MD  Iron-Vitamin C (VITRON-C) 65-125 MG TABS Take 1 tablet by mouth daily at 12 noon.    Yes Historical Provider, MD  levothyroxine (SYNTHROID, LEVOTHROID) 137 MCG tablet Take 137 mcg by mouth daily before breakfast.   Yes Historical Provider, MD  lidocaine (LIDODERM) 5 % Place 1 patch onto the skin daily.    Yes Historical Provider, MD  LORazepam (ATIVAN) 1 MG tablet Take 1 mg by mouth at bedtime.   Yes Historical Provider, MD  meclizine (ANTIVERT) 25 MG tablet Take 25 mg by mouth 3 (three) times daily as needed for nausea.   Yes Historical Provider, MD  meloxicam (MOBIC) 15 MG tablet Take 15 mg by mouth at bedtime.    Yes Historical Provider, MD  metFORMIN (GLUCOPHAGE) 1000 MG tablet Take 1,000 mg by mouth 2 (two) times daily with a meal.   Yes Historical Provider, MD  metoprolol (LOPRESSOR) 50 MG tablet Take 1 tablet (50 mg total) by mouth 2 (two) times daily. 11/22/14  Yes Rusty Aus, MD  Multiple Vitamin (MULTIVITAMIN WITH MINERALS) TABS tablet Take 1 tablet by mouth daily.   Yes Historical Provider, MD  omeprazole (PRILOSEC) 20 MG capsule Take 20 mg by mouth daily.   Yes Historical Provider, MD  polyethylene glycol (MIRALAX / GLYCOLAX) packet Take 17 g by mouth every other day.    Yes Historical Provider, MD  predniSONE (DELTASONE) 5 MG tablet Take 7.5 mg by mouth daily with breakfast.    Yes Historical Provider, MD  Skin Protectants, Misc. (ENDIT EX) Apply 1 application topically  as needed (for redness/irritation).   Yes Historical Provider, MD  sotalol (BETAPACE) 80 MG tablet Take 0.5 tablets (40 mg total) by mouth every 12 (twelve) hours. 11/22/14  Yes Rusty Aus, MD   Dg Chest 1 View  05/03/2015  CLINICAL DATA:  Fall today on right side with right hip fracture EXAM: CHEST 1 VIEW COMPARISON:  01/29/2015 FINDINGS: Cardiac shadow is again enlarged. A hiatal hernia is again seen and stable. Aortic calcifications are noted. The lungs are well-aerated without focal infiltrate. Multiple old rib fractures on the right are seen with chest wall deformity. IMPRESSION: Chronic changes without acute abnormality. Electronically Signed   By: Inez Catalina M.D.   On: 05/03/2015 18:07  Dg Hip Unilat With Pelvis 2-3 Views Right  05/03/2015  CLINICAL DATA:  Fall today with right hip pain.  Initial encounter. EXAM: DG HIP (WITH OR WITHOUT PELVIS) 2-3V RIGHT COMPARISON:  None. FINDINGS: Acute fracture of the proximal right femur present through the intratrochanteric region. This fracture shows mild displacement. No dislocation. No other fractures are identified. No bony lesions are seen. Vascular calcifications are seen in the femoral arteries bilaterally. IMPRESSION: Acute intratrochanteric fracture of the right hip with mild displacement. Electronically Signed   By: Aletta Edouard M.D.   On: 05/03/2015 18:07    Positive ROS: All other systems have been reviewed and were otherwise negative with the exception of those mentioned in the HPI and as above.  Physical Exam: General:  Alert, no acute distress Psychiatric:  Patient is appropriately responsive with normal mood and affect   Cardiovascular:  No pedal edema Respiratory:  No wheezing, non-labored breathing GI:  Abdomen is soft and non-tender Skin:  No lesions in the area of chief complaint Neurologic:  Sensation intact distally Lymphatic:  No axillary or cervical lymphadenopathy  Orthopedic Exam:  Orthopedic examination is limited  to her right hip, right lower extremity, and right foot. Skin inspection around the right hip is unremarkable. She has mild tenderness to palpation over the trochanteric region. She has more significant pain with any attempted active or passive motion of the right hip. She is neurovascularly intact to the right lower extremity and foot as she is able to dorsiflex and plantarflex her toes and ankle and has intact sensation to light touch to all distributions.  X-rays:  X-rays of the pelvis and right hip are available for review. These films demonstrate a mildly displaced intertrochanteric fracture of the right hip. No significant degenerative changes are noted. No lytic lesions are identified.  Assessment: Mildly displaced two-part intertrochanteric fracture right hip.  Plan: The treatment options are discussed with the patient and her family, including both nonsurgical and surgical options. The patient and her family would like to proceed with surgical intervention to include a reduction and stabilization of the right hip fracture with a trochanteric femoral nail. This procedure has been discussed in detail, as have the potential risks (including bleeding, infection, nerve and/or blood vessel injury, persistent or recurrent pain, stiffness, malunion and/or nonunion, need for further surgery, blood clots, strokes, heart attacks and/or arrhythmias, etc.) and benefits. The patient and her family state their understanding and wished to proceed. A formal written consent has been signed by the patient's family.  Thank you for ask me to participate in the care of this most pleasant unfortunate woman. I will be happy to follow her with you.   Pascal Lux, MD  Beeper #:  [phone]  05/04/2015 10:37 AM

## 2015-05-04 NOTE — Progress Notes (Addendum)
Visit made. Patient is currently followed by [facility] at [facility] with a hospice diagnosis of Protein calorie malnutrition. She is a DNR code. Hospital Care team made aware that patient is followed by hospice. [name] was admitted to [facility] following a fall on 05/03/15. Xray of her hip showed acute intratrochanteric fracture of the right hip with mild displacement. At time of writer's visit patient was in surgery for right hip repair. Writer spoke with her daughter [name] in the waiting room. Plan is for rehab if [name] is a candidate. Emotional support offered. [name] is aware that hospital liaison will continue to follow through final disposition and that hospice benefit would need to be revoked if [name] is discharged to rehab. Thank you. Flo Shanks RN, BSN, [facility] [place], hospital Liaison [phone] c

## 2015-05-04 NOTE — Clinical Social Work Note (Signed)
Clinical Social Work Assessment  Patient Details  Name: Amanda Stark MRN: [id number] Date of Birth: September 19, 1922  Date of referral:  05/04/15               Reason for consult:  Facility Placement, Other (Comment Required) (From [facility] )                Permission sought to share information with:  Chartered certified accountant granted to share information::  Yes, Verbal Permission Granted  Name::      [place]::   [place]   Relationship::     Contact Information:     Housing/Transportation Living arrangements for the past 2 months:  [address] of Information:  Patient, Adult Children, Power of Attorney Patient Interpreter Needed:  None Criminal Activity/Legal Involvement Pertinent to Current Situation/Hospitalization:  No - Comment as needed Significant Relationships:  Adult Children Lives with:  Facility Resident Do you feel safe going back to the place where you live?  Yes Need for family participation in patient care:  Yes (Comment)  Care giving concerns:  Patient is a resident at [facility].    Social Worker assessment / plan:  Holiday representative ([place]) received verbal consult from [facility] liaison that patient is currently a [facility] Hospice patient. Patient has a hip fracture and pending medical clearance Dr. Roland Rack will operate. CSW met with patient and her son [name]) [phone] and daughter [name] were at bedside. Patient was very pleasant and answered questions appropriately. Per son patient has been a resident at [facility] since August 2016. Per son patient is currently a hospice patient. Per son patient's baseline at [facility] was wheel chair bound and patient could transfer. CSW explained discharge options to son and daughter. If patient is back to baseline after surgery (transfering herself to and from wheel chair) then patient can return to [facility] and continue  hospice services. [facility] administrator did confirm that patient is wheel chair bound and patient can return if she is at her baseline. CSW also explained that patient can go to short term rehab at a SNF but will have to stop hospice services. CSW explained that going to rehab at a SNF will involve intense PT 5 days per week. CSW also explained that patient can go to long term care SNF under private pay or Medicaid. Per son patient is private paying at [facility] and does not qualify for Medicaid at this point. Per son they cannot afford private pay at a SNF. Son reported that patient is spending down and will eventually qualify for Medicaid. Son and daughter prefer for patient to return to [facility] if they can meet patient's needs. Son and daughter prefer [facility] if patient needs SNF.   FL2 complete and faxed out. Patient had an existing SNF [facility]. CSW will continue to follow and assist as needed.    Employment status:  Disabled (Comment on whether or not currently receiving Disability), Retired Nurse, adult PT Recommendations:  Not assessed at this time Information / Referral to community resources:  [place], Other (Comment Required) (ALF VS. SNF )  Patient/Family's Response to care:  Patient's son and daughter are open to SNF or going back to ALF pending patient's progress after surgery.   Patient/Family's Understanding of and Emotional Response to Diagnosis, Current Treatment, and Prognosis: Patient's son and daughter were very  pleasant and thanked CSW for visit.   Emotional Assessment Appearance:  Appears stated age Attitude/Demeanor/Rapport:    Affect (typically observed):  Pleasant Orientation:  Oriented to Self, Oriented to Place, Oriented to  Time, Oriented to Situation Alcohol / Substance use:  Not Applicable Psych involvement (Current and /or in the community):  No (Comment)  Discharge Needs  Concerns to be addressed:   Discharge Planning Concerns Readmission within the last 30 days:  No Current discharge risk:  Dependent with Mobility Barriers to Discharge:  Continued Medical Work up   Loralyn Freshwater, LCSW 05/04/2015, 1:45 PM

## 2015-05-04 NOTE — Progress Notes (Signed)
Specialty low bed ordered.

## 2015-05-04 NOTE — Anesthesia Preprocedure Evaluation (Addendum)
Anesthesia Evaluation  Patient identified by MRN, date of birth, ID band Patient awake    Reviewed: Allergy & Precautions, NPO status , Patient's Chart, lab work & pertinent test results, reviewed documented beta blocker date and time   Airway Mallampati: II  TM Distance: >3 FB     Dental  (+) Upper Dentures, Chipped   Pulmonary neg pulmonary ROS,    Pulmonary exam normal breath sounds clear to auscultation       Cardiovascular hypertension, Pt. on medications and Pt. on home beta blockers + dysrhythmias Atrial Fibrillation and Supra Ventricular Tachycardia  Rhythm:Irregular     Neuro/Psych Mild dementia     Spinal stenosis negative psych ROS   GI/Hepatic Neg liver ROS, Esophageal CA   Endo/Other  diabetes  Renal/GU negative Renal ROSKidney stone     Musculoskeletal  (+) Arthritis , Osteoarthritis,  Fx   Abdominal Normal abdominal exam  (+)   Peds  Hematology   Anesthesia Other Findings Thyroid CA Polymyalgia rheumatica Esophageal CA  Reproductive/Obstetrics                         Anesthesia Physical Anesthesia Plan  ASA: III  Anesthesia Plan: Spinal   Post-op Pain Management:    Induction: Intravenous  Airway Management Planned: Nasal Cannula  Additional Equipment:   Intra-op Plan:   Post-operative Plan:   Informed Consent: I have reviewed the patients History and Physical, chart, labs and discussed the procedure including the risks, benefits and alternatives for the proposed anesthesia with the patient or authorized representative who has indicated his/her understanding and acceptance.   Dental advisory given  Plan Discussed with: CRNA and Surgeon  Anesthesia Plan Comments:         Anesthesia Quick Evaluation

## 2015-05-04 NOTE — Care Management (Signed)
Received notification from Santiago Glad with [facility] that patient is followed by them at [facility] assisted living. RNCM will continue to follow.

## 2015-05-04 NOTE — Op Note (Signed)
05/03/2015 - 05/04/2015  3:42 PM  Patient:   Amanda Stark  Pre-Op Diagnosis:   Displaced two-part intertrochanteric fracture, right hip.  Post-Op Diagnosis:   Same.  Procedure:   Reduction and internal fixation of right hip fracture with Biomet Affyxis TFN nail.  Surgeon:   Pascal Lux, MD  Assistant:   None  Anesthesia:   Spinal  Findings:   As above  Complications:   None  EBL:   50 cc  Fluids:   1000 cc crystalloid  UOP:   250 cc  TT:   None  Drains:   None  Closure:   Staples  Implants:   Biomet Affyxis 11 x 360 mm TFN with a 95 mm lag screw and a 44 mm distal interlocking screw  Brief Clinical Note:   The patient is a 82 year old female who sustained the above-noted injury last evening when she fell onto her right side while attempting to get up to walk. She was brought to the emergency room where x-rays demonstrated the above-noted injury. The patient has been cleared medically and presents at this time for reduction and internal fixation of the right hip fracture.  Procedure:   The patient was brought into the operating room. After adequate spinal anesthesia was obtained, the patient was lain in the supine position on the fracture table. The uninjured leg was placed in a flexed and abducted position while the injured lower extremity was placed in longitudinal traction. The fracture was reduced using longitudinal traction and internal rotation. The adequacy of reduction was verified fluoroscopically in AP and lateral projections and found to be near anatomic. The lateral aspects of the right hip and thigh were prepped with ChloraPrep solution before being draped sterilely. Preoperative antibiotics were administered. The greater trochanter was identified fluoroscopically and an approximately 3 cm incision made about 2-3 fingerbreadths above the tip of the greater trochanter. The incision was carried down through the subcutaneous tissues to expose the gluteal fascia.  This was split the length of the incision, providing access to the tip of the trochanter. Under fluoroscopic guidance, a guidewire was drilled through the tip of the trochanter into the proximal metaphysis to the level of the lesser trochanter. After verifying its position fluoroscopically in AP and lateral projections, it was overreamed with the initial reamer to the depth of the lesser trochanter. A guidewire was passed down through the femoral canal to the supracondylar region. The adequacy of guidewire position was verified fluoroscopically in AP and lateral projections before the length of the guidewire within the canal was measured and found to be 360 mm. It was overreamed sequentially using the flexible reamers, beginning with a 9 mm reamer and progressing to a 12.5 mm reamer. This provided fair cortical chatter. The 11 x 360 mm Biomet Affyxis TFN rod was selected and advanced to the appropriate depth, as verified fluoroscopically. The guide system for the lag screw was positioned and advanced through an approximately 2 cm stab incision over the lateral aspect of the proximal femur. The guidewire was drilled up through the trochanteric femoral nail and into the femoral neck to rest within 5 mm of subchondral bone. After verifying its position in the femoral neck and head in both AP and lateral projections, the guidewire was measured and found to be optimally replicated by a 95 mm lag screw. The guidewire was overreamed to the appropriate depth before the lag screw was inserted and advanced to the appropriate depth as verified fluoroscopically in AP and  lateral projections. The locking screw was advanced, then backed off a quarter turn to set the lag screw. Again the adequacy of hardware position and fracture reduction was verified fluoroscopically in AP and lateral projections and found to be excellent.  Attention was directed distally. Using the "perfect circle" technique, the leg and fluoroscopy machine  were positioned appropriately. An approximate 1.5 cm stab incision was made over the skin at the appropriate point before the drill bit was advanced through the cortex and across the static hole of the nail. The appropriate length of the screw was determined before the 44 mm distal interlocking screw was positioned, then advanced and tightened securely. Again the adequacy of screw position was verified fluoroscopically in AP and lateral projections and found to be excellent.  The wounds were irrigated thoroughly with sterile saline solution before the deeper subcutaneous tissues were closed using 2-0 Vicryl interrupted sutures. The skin was closed using staples. Sterile occlusive dressings were applied to all wounds before the patient was transferred back to his/her hospital bed. The patient was then transferred to the recovery room in satisfactory condition after tolerating the procedure well.

## 2015-05-04 NOTE — Transfer of Care (Signed)
Immediate Anesthesia Transfer of Care Note  Patient: Amanda Stark  Procedure(s) Performed: Procedure(s): INTRAMEDULLARY (IM) NAIL INTERTROCHANTRIC (Right)  Patient Location: PACU  Anesthesia Type:General  Level of Consciousness: sedated  Airway & Oxygen Therapy: Patient Spontanous Breathing and Patient connected to face mask oxygen  Post-op Assessment: Report given to RN and Post -op Vital signs reviewed and stable  Post vital signs: Reviewed and stable  Last Vitals:  Filed Vitals:   05/04/15 1330 05/04/15 1548  BP: 137/44 136/55  Pulse: 80 73  Temp: 36.4 C 36.2 C  Resp: 16 16    Complications: No apparent anesthesia complications

## 2015-05-05 ENCOUNTER — Encounter: Payer: Self-pay | Admitting: Surgery

## 2015-05-05 LAB — CBC WITH DIFFERENTIAL/PLATELET
Basophils Absolute: 0.1 10*3/uL (ref 0–0.1)
Basophils Relative: 1 %
EOS PCT: 2 %
Eosinophils Absolute: 0.3 10*3/uL (ref 0–0.7)
HEMATOCRIT: 22.2 % — AB (ref 35.0–47.0)
Hemoglobin: 7.3 g/dL — ABNORMAL LOW (ref 12.0–16.0)
LYMPHS ABS: 1.5 10*3/uL (ref 1.0–3.6)
LYMPHS PCT: 9 %
MCH: 27.9 pg (ref 26.0–34.0)
MCHC: 32.7 g/dL (ref 32.0–36.0)
MCV: 85.1 fL (ref 80.0–100.0)
MONO ABS: 1.2 10*3/uL — AB (ref 0.2–0.9)
Monocytes Relative: 7 %
NEUTROS ABS: 13.5 10*3/uL — AB (ref 1.4–6.5)
Neutrophils Relative %: 81 %
PLATELETS: 239 10*3/uL (ref 150–440)
RBC: 2.61 MIL/uL — AB (ref 3.80–5.20)
RDW: 13.4 % (ref 11.5–14.5)
WBC: 16.6 10*3/uL — AB (ref 3.6–11.0)

## 2015-05-05 LAB — HEMOGLOBIN AND HEMATOCRIT, BLOOD
HEMATOCRIT: 25.1 % — AB (ref 35.0–47.0)
Hemoglobin: 8.4 g/dL — ABNORMAL LOW (ref 12.0–16.0)

## 2015-05-05 LAB — GLUCOSE, CAPILLARY
GLUCOSE-CAPILLARY: 232 mg/dL — AB (ref 65–99)
Glucose-Capillary: 195 mg/dL — ABNORMAL HIGH (ref 65–99)
Glucose-Capillary: 205 mg/dL — ABNORMAL HIGH (ref 65–99)
Glucose-Capillary: 151 mg/dL — ABNORMAL HIGH (ref 65–99)

## 2015-05-05 LAB — BASIC METABOLIC PANEL
Anion gap: 5 (ref 5–15)
BUN: 26 mg/dL — AB (ref 6–20)
CO2: 27 mmol/L (ref 22–32)
Calcium: 8.5 mg/dL — ABNORMAL LOW (ref 8.9–10.3)
Chloride: 99 mmol/L — ABNORMAL LOW (ref 101–111)
Creatinine, Ser: 0.59 mg/dL (ref 0.44–1.00)
GFR calc Af Amer: >60 mL/min (ref >60)
GLUCOSE: 217 mg/dL — AB (ref 65–99)
POTASSIUM: 4 mmol/L (ref 3.5–5.1)
Sodium: 131 mmol/L — ABNORMAL LOW (ref 135–145)

## 2015-05-05 LAB — HEMOGLOBIN A1C: Hgb A1c MFr Bld: 6.7 % — ABNORMAL HIGH (ref 4.0–6.0)

## 2015-05-05 LAB — PREPARE RBC (CROSSMATCH)

## 2015-05-05 MED ORDER — SODIUM CHLORIDE 0.9 % IV BOLUS (SEPSIS)
250.0000 mL | Freq: Once | INTRAVENOUS | Status: AC
  Administered 2015-05-05: 250 mL via INTRAVENOUS

## 2015-05-05 MED ORDER — INSULIN GLARGINE 100 UNIT/ML ~~LOC~~ SOLN
7.0000 [IU] | Freq: Every day | SUBCUTANEOUS | Status: DC
  Administered 2015-05-05 – 2015-05-06 (×2): 7 [IU] via SUBCUTANEOUS
  Filled 2015-05-05 (×3): qty 0.07

## 2015-05-05 NOTE — Progress Notes (Signed)
Pts hemoglobin came back as 7.3. Dr. Marcille Blanco notified and will let surgeon make decision on whether to order blood.

## 2015-05-05 NOTE — Progress Notes (Signed)
Subjective: 1 Day Post-Op Procedure(s) (LRB): INTRAMEDULLARY (IM) NAIL INTERTROCHANTRIC (Right) Patient reports pain as mild.   Patient is well, and has had no acute complaints or problems Plan is to go Skilled nursing facility after hospital stay. Negative for chest pain and shortness of breath Fever: no Gastrointestinal:Positive for nausea and vomiting  Objective: Vital signs in last 24 hours: Temp:  [96.9 F (36.1 C)-99.6 F (37.6 C)] 98.6 F (37 C) (04/07 0742) Pulse Rate:  [72-89] 82 (04/07 0742) Resp:  [15-18] 18 (04/07 0742) BP: (97-161)/(35-93) 142/41 mmHg (04/07 0742) SpO2:  [88 %-100 %] 92 % (04/07 0742)  Intake/Output from previous day:  Intake/Output Summary (Last 24 hours) at 05/05/15 0802 Last data filed at 05/05/15 [id number]  Gross per 24 hour  Intake 1767.5 ml  Output   1075 ml  Net  692.5 ml    Intake/Output this shift:    Labs:  Recent Labs  05/03/15 1710 05/04/15 0444 05/05/15 0446  HGB 10.9* 8.6* 7.3*    Recent Labs  05/04/15 0444 05/05/15 0446  WBC 14.4* 16.6*  RBC 3.05* 2.61*  HCT 25.8* 22.2*  PLT 298 239    Recent Labs  05/04/15 0444 05/05/15 0446  NA 130* 131*  K 4.3 4.0  CL 92* 99*  CO2 32 27  BUN 31* 26*  CREATININE 0.90 0.59  GLUCOSE 164* 217*  CALCIUM 8.9 8.5*   No results for input(s): LABPT, INR in the last 72 hours.   EXAM General - Patient is Alert and Lacking Extremity - ABD soft Sensation intact distally Intact pulses distally Dorsiflexion/Plantar flexion intact Incision: dressing C/D/I Dressing/Incision - clean, dry, no drainage Motor Function - intact, moving foot and toes well on exam.  Abdomen soft on exam. Foley removed.  Past Medical History  Diagnosis Date  . SVT (supraventricular tachycardia) ([place])   . Thyroid disease   . Instability of right shoulder joint   . Glenohumeral arthritis   . Diabetes mellitus without complication ([place])   . Hypertension   . Hyperlipidemia   . PMR (polymyalgia  rheumatica) ([facility])   . Lumbar spinal stenosis   . Osteoporosis   . Thyroid cancer ([place])   . Esophageal cancer ([place])   . Vertigo    Assessment/Plan: 1 Day Post-Op Procedure(s) (LRB): INTRAMEDULLARY (IM) NAIL INTERTROCHANTRIC (Right) Principal Problem:   Closed right hip fracture (HCC)  Estimated body mass index is 18.43 kg/(m^2) as calculated from the following:   Height as of this encounter: 5\' 3"  (1.6 m).   Weight as of this encounter: 47.174 kg (104 lb). Advance diet Up with therapy   Foley removed this AM. Labs reviewed, Hg 7.3 this AM, will transfuse one unit this AM. WBC 16.6, likely post-surgical inflammation. Pt nauseated this AM, will add Zofran if not already on it CBC and BMP ordered for tomorrow morning.  DVT Prophylaxis - Lovenox, Foot Pumps and TED hose Weight-Bearing as tolerated to right leg  J. Cameron Proud, PA-C [facility] 05/05/2015, 8:02 AM

## 2015-05-05 NOTE — Clinical Social Work Note (Signed)
CSW provided bed offers. PT is recommending SNF. [name] chose [facility]. CSW spoke with MD, and pt will likely be discharged on Monday. CSW confirmed with facility of bed choice and discharge date. CSW will also follow up with [place] with [facility]. CSW will continue to follow.   Darden Dates, MSW, LCSW  Clinical Social Worker  [phone]

## 2015-05-05 NOTE — Progress Notes (Signed)
Visit made. Patient seen lying in bed, having just attempted some physical therapy. She reported nausea. Her son [name] and daughter [name] present at bedside reported that Amanda Stark "just had something for that". Patient requested something to drink, Glucerna given, no difficulty swallowing noted. Patient remained alert and interactive through out visit.  Her hemoglobin was 7.3 this morning and she is scheduled to receive a unit of blood. Plan remains for patient to discharge to [facility] for short term rehab. This will require the revocation of Amanda Stark's hospice benefit, both [name] and [name] are aware. Thank you. Flo Shanks RN, BSN, [place] [facility] [place], [facility] [phone] c

## 2015-05-05 NOTE — Care Management Important Message (Signed)
Important Message  Patient Details  Name: Amanda Stark MRN: [id number] Date of Birth: [birth year]   Medicare Important Message Given:  Yes    [name] A Donevin Sainsbury 05/05/2015, 10:12 AM

## 2015-05-05 NOTE — Progress Notes (Signed)
Physical Therapy Treatment Patient Details Name: MOUKTHIKA GRIFFIETH MRN: [id number] DOB: September 06, 1922 Today's Date: 05/05/2015    History of Present Illness Pt here after fall with R hip fx and subsequent ORIF.  She has had a L lower leg injury since February and has only been able to do some limited transfers to w/c and BSC.  Pt's HGB was 7.3 at eval, pt with hospice services from [facility].    PT Comments    Pt states she has more energy this afternoon after having transfusion, but asks not to try getting up again until tomorrow.  She shows good effort with exercises but is still weak with any anti-gravity movements.  Pt able to do hip Ab/Ad with less assist and appears to have less discomfort with the exercises in general.   Follow Up Recommendations  SNF     Equipment Recommendations       Recommendations for Other Services       Precautions / Restrictions Precautions Precautions: Fall Restrictions RLE Weight Bearing: Weight bearing as tolerated    Mobility  Bed Mobility               General bed mobility comments: Pt requests not to try and get up this afternoon, siting pain, weakness and general fatigue  Transfers                    Ambulation/Gait                 Stairs            Wheelchair Mobility    Modified Rankin (Stroke Patients Only)       Balance                                    Cognition Arousal/Alertness: Awake/alert Behavior During Therapy: WFL for tasks assessed/performed Overall Cognitive Status: Within Functional Limits for tasks assessed                      Exercises General Exercises - Lower Extremity Ankle Circles/Pumps: AROM;10 reps Quad Sets: Strengthening;10 reps Gluteal Sets: Strengthening;10 reps Short Arc Quad: AAROM;10 reps Heel Slides: 10 reps;AROM Hip ABduction/ADduction: 10 reps;AROM    General Comments        Pertinent Vitals/Pain Pain Assessment:  (unrated, has  pain with all ROM at R hip)    Home Living                      Prior Function            PT Goals (current goals can now be found in the care plan section)      Frequency  BID    PT Plan Current plan remains appropriate    Co-evaluation             End of Session   Activity Tolerance: Patient limited by fatigue;Patient limited by pain Patient left: with family/visitor present;with bed alarm set;with call bell/phone within reach     Time: 1435-1450 PT Time Calculation (min) (ACUTE ONLY): 15 min  Charges:  $Therapeutic Exercise: 8-22 mins                    G Codes:     Wayne Both, PT, DPT [phone]  Kreg Shropshire 05/05/2015, 3:36 PM

## 2015-05-05 NOTE — Anesthesia Postprocedure Evaluation (Signed)
Anesthesia Post Note  Patient: Amanda Stark  Procedure(s) Performed: Procedure(s) (LRB): INTRAMEDULLARY (IM) NAIL INTERTROCHANTRIC (Right)  Patient location during evaluation: Nursing Unit Anesthesia Type: Spinal Level of consciousness: awake Pain management: satisfactory to patient Vital Signs Assessment: post-procedure vital signs reviewed and stable Respiratory status: spontaneous breathing and nonlabored ventilation Cardiovascular status: stable Postop Assessment: no headache, no backache and spinal receding Anesthetic complications: no    Last Vitals:  Filed Vitals:   05/05/15 0359 05/05/15 0742  BP: 150/48 142/41  Pulse:  82  Temp:  37 C  Resp:  18    Last Pain:  Filed Vitals:   05/05/15 0742  PainSc: Asleep                 Brantley Fling

## 2015-05-05 NOTE — Evaluation (Signed)
Physical Therapy Evaluation Patient Details Name: Amanda Stark MRN: [id number] DOB: Aug 03, 1922 Today's Date: 05/05/2015   History of Present Illness  Pt here after fall with R hip fx and subsequent ORIF.  She has had a L lower leg injury since February and has only been able to do some limited transfers to w/c and BSC.  Pt's HGB was 7.3 at eval, pt with hosp[facility] services from [facility].  Clinical Impression  Pt struggles t/o PT exam and though she is able to minimally participate with 10 minutes of exercises apart from PT her pain and general weakness are limiters.  She makes an attempt at standing/side stepping at EOB but ultimately is not able to do a whole lot.  Her HGB was 7.3 and she was generally limited with all she was able to do.    Follow Up Recommendations SNF    Equipment Recommendations       Recommendations for Other Services       Precautions / Restrictions Precautions Precautions: Fall Restrictions Weight Bearing Restrictions: Yes RLE Weight Bearing: Weight bearing as tolerated      Mobility  Bed Mobility Overal bed mobility: Needs Assistance Bed Mobility: Supine to Sit;Sit to Supine     Supine to sit: Mod assist;Max assist Sit to supine: Mod assist;Max assist   General bed mobility comments: Pt shows some effort in getting to/from EOB but is weak and needs considerable assist  Transfers Overall transfer level: Needs assistance Equipment used: Rolling walker (2 wheeled) Transfers: Sit to/from Stand Sit to Stand: Max assist         General transfer comment: Pt struggles to get to standing even with heavy assist.  Only gets to full upright ~2 seconds before needing to sit back down abruptly  Ambulation/Gait             General Gait Details: Pt takes one single, heavily assisted side step, unable to fully participate secondary to fear, weakness and pain  Stairs            Wheelchair Mobility    Modified Rankin (Stroke Patients Only)        Balance                                             Pertinent Vitals/Pain Pain Assessment: Faces Faces Pain Scale: Hurts even more    Home Living Family/patient expects to be discharged to:: Skilled nursing facility                 Additional Comments: Pt lives at [facility]    Prior Function Level of Independence: Needs assistance   Gait / Transfers Assistance Needed: Uses rollator     Comments: Pt has been essentially non-ambulatory for ~2 months and had only limited ambulation prior to that.      Hand Dominance        Extremity/Trunk Assessment   Upper Extremity Assessment: RUE deficits/detail;Generalized weakness RUE Deficits / Details: chronic R shoulder limitations, pain and weakness. She is able to raise it >90 degrees, but with pain and hesitancy.         Lower Extremity Assessment: RLE deficits/detail;Generalized weakness RLE Deficits / Details: Pt with almost no strength in R LE, L grossly 3+/5       Communication   Communication:  (general confusion, daughter answering most questions)  Cognition Arousal/Alertness: Awake/alert  Behavior During Therapy: Restless Overall Cognitive Status: Difficult to assess                      General Comments      Exercises General Exercises - Lower Extremity Ankle Circles/Pumps: AROM;10 reps Quad Sets: Strengthening;10 reps Gluteal Sets: Strengthening;10 reps Short Arc Quad: AAROM;10 reps Heel Slides: AROM;5 reps;AAROM Hip ABduction/ADduction: AAROM;5 reps      Assessment/Plan    PT Assessment Patient needs continued PT services  PT Diagnosis Difficulty walking;Generalized weakness;Acute pain   PT Problem List Decreased strength;Decreased activity tolerance;Decreased balance;Decreased mobility;Decreased safety awareness;Decreased knowledge of use of DME  PT Treatment Interventions DME instruction;Gait training;Stair training;Functional mobility  training;Therapeutic activities;Therapeutic exercise;Balance training;Patient/family education   PT Goals (Current goals can be found in the Care Plan section) Acute Rehab PT Goals Patient Stated Goal: pt unable to state PT Goal Formulation: With patient/family Time For Goal Achievement: 05/19/15 Potential to Achieve Goals: Fair    Frequency BID   Barriers to discharge        Co-evaluation               End of Session Equipment Utilized During Treatment: Gait belt Activity Tolerance: Patient limited by pain;Patient limited by fatigue Patient left: with family/visitor present;with bed alarm set;with call bell/phone within reach           Time: 1001-1034 PT Time Calculation (min) (ACUTE ONLY): 33 min   Charges:   PT Evaluation $PT Eval Moderate Complexity: 1 Procedure     PT G Codes:       Wayne Both, PT, DPT [phone]  Kreg Shropshire 05/05/2015, 12:13 PM

## 2015-05-05 NOTE — Progress Notes (Signed)
Patient received 1 unit of Blood. Post hgb/hct reported to MD. Patient has not voided post foley removal - see nursing communication per DR. Poggi

## 2015-05-05 NOTE — Progress Notes (Addendum)
Inpatient Diabetes Program Recommendations  AACE/ADA: New Consensus Statement on Inpatient Glycemic Control (2015)  Target Ranges:  Prepandial:   less than 140 mg/dL      Peak postprandial:   less than 180 mg/dL (1-2 hours)      Critically ill patients:  140 - 180 mg/dL   Review of Glycemic Control  Results for Amanda Stark, Amanda Stark (MRN [id number]) as of 05/05/2015 11:26  Ref. Range 05/04/2015 17:00 05/04/2015 19:20 05/04/2015 20:51 05/05/2015 07:45 05/05/2015 11:05  Glucose-Capillary Latest Ref Range: 65-99 mg/dL 144 (H) 119 (H) 169 (H) 195 (H) 205 (H)     Diabetes history: Type 2 Outpatient Diabetes medications: Levemir 10 units qam, 4 units qpm, Novolog 1-5 units tid with meals if sugar > 200mg /dl, Metformin 1000mg  bid Current orders for Inpatient glycemic control: Novolog 0-9 units tid, Novolog 0-5 units qhs  Inpatient Diabetes Program Recommendations:  A1C  On 10/31/14 was 8.5%- let's recheck it since it's been more than 3 months.  Per ADA recommendations "consider performing an A1C on all patients with diabetes or hyperglycemia admitted to the hospital if not performed in the prior 3 months".  Consider starting 7 units Lantus beginning tonight.  Please add "no concentrated sweets" to diet order.   Thanks,  Gentry Fitz, RN, BA, MHA, CDE Diabetes Coordinator Inpatient Diabetes Program  [phone] (Team Pager) [phone] ([place]) 05/05/2015 11:31 AM

## 2015-05-05 NOTE — Progress Notes (Signed)
Patient ID: Amanda Stark, female   DOB: [birth year], 82 y.o.   MRN: [id number] Amanda Stark at [place] NAME: Amanda Stark    MR#:  [id number]  DATE OF BIRTH:  Mar 12, 1922  SUBJECTIVE:  Came in with hip pain after fall. POD #1 Doing well. Eating lunch. dter in the room  REVIEW OF SYSTEMS:   Review of Systems  Constitutional: Negative for fever, chills and weight loss.  HENT: Negative for ear discharge, ear pain and nosebleeds.   Eyes: Negative for blurred vision, pain and discharge.  Respiratory: Negative for sputum production, shortness of breath, wheezing and stridor.   Cardiovascular: Negative for chest pain, palpitations, orthopnea and PND.  Gastrointestinal: Negative for nausea, vomiting, abdominal pain and diarrhea.  Genitourinary: Negative for urgency and frequency.  Musculoskeletal: Positive for joint pain. Negative for back pain.  Neurological: Negative for sensory change, speech change, focal weakness and weakness.  Psychiatric/Behavioral: Negative for depression and hallucinations. The patient is not nervous/anxious.   All other systems reviewed and are negative.  Tolerating Diet:yes Tolerating PT: SNF  DRUG ALLERGIES:   Allergies  Allergen Reactions  . Ascriptin A-D [Aspirin Buf(Alhyd-Mghyd-Cacar)] Other (See Comments)    Reaction:  Unknown   . Aspirin Other (See Comments)    Reaction:  Unknown   . Codeine Other (See Comments)    Reaction:  Unknown   . Hydrocodone Other (See Comments)    Reaction:  Unknown   . Statins Other (See Comments)    Reaction:  Muscle pain    . Tramadol Nausea And Vomiting    VITALS:  Blood pressure 118/51, pulse 85, temperature 98.3 F (36.8 C), temperature source Oral, resp. rate 17, height 5\' 3"  (1.6 m), weight 47.174 kg (104 lb), SpO2 91 %.  PHYSICAL EXAMINATION:   Physical Exam  GENERAL:  82 y.o.-year-old patient lying in the bed with no acute distress.  EYES: Pupils equal,  round, reactive to light and accommodation. No scleral icterus. Extraocular muscles intact.  HEENT: Head atraumatic, normocephalic. Oropharynx and nasopharynx clear.  NECK:  Supple, no jugular venous distention. No thyroid enlargement, no tenderness.  LUNGS: Normal breath sounds bilaterally, no wheezing, rales, rhonchi. No use of accessory muscles of respiration.  CARDIOVASCULAR: S1, S2 normal. No murmurs, rubs, or gallops.  ABDOMEN: Soft, nontender, nondistended. Bowel sounds present. No organomegaly or mass.  EXTREMITIES: No cyanosis, clubbing or edema b/l.   right leg surgical scar looks ok NEUROLOGIC: Cranial nerves II through XII are intact. No focal Motor or sensory deficits b/l.   PSYCHIATRIC:  patient is alert and awake SKIN: No obvious rash, lesion, or ulcer.   LABORATORY PANEL:  CBC  Recent Labs Lab 05/05/15 0446  WBC 16.6*  HGB 7.3*  HCT 22.2*  PLT 239    Chemistries   Recent Labs Lab 05/05/15 0446  NA 131*  K 4.0  CL 99*  CO2 27  GLUCOSE 217*  BUN 26*  CREATININE 0.59  CALCIUM 8.5*   Cardiac Enzymes No results for input(s): TROPONINI in the last 168 hours. RADIOLOGY:  Dg Chest 1 View  05/03/2015  CLINICAL DATA:  Fall today on right side with right hip fracture EXAM: CHEST 1 VIEW COMPARISON:  01/29/2015 FINDINGS: Cardiac shadow is again enlarged. A hiatal hernia is again seen and stable. Aortic calcifications are noted. The lungs are well-aerated without focal infiltrate. Multiple old rib fractures on the right are seen with chest wall deformity. IMPRESSION: Chronic changes without acute abnormality.  Electronically Signed   By: Inez Catalina M.D.   On: 05/03/2015 18:07   Dg Hip Operative Unilat W Or W/o Pelvis Right  05/04/2015  CLINICAL DATA:  ORIF right femur EXAM: OPERATIVE right HIP (WITH PELVIS IF PERFORMED) 4 VIEWS TECHNIQUE: Fluoroscopic spot image(s) were submitted for interpretation post-operatively. COMPARISON:  05/03/2015 FINDINGS: Four views show gamma  nail fixation of a trochanteric region fracture on the right. Components appear well positioned. No radiographically detectable complication. IMPRESSION: ORIF right trochanteric region fracture. Electronically Signed   By: Nelson Chimes M.D.   On: 05/04/2015 15:45   Dg Hip Unilat With Pelvis 2-3 Views Right  05/03/2015  CLINICAL DATA:  Fall today with right hip pain.  Initial encounter. EXAM: DG HIP (WITH OR WITHOUT PELVIS) 2-3V RIGHT COMPARISON:  None. FINDINGS: Acute fracture of the proximal right femur present through the intratrochanteric region. This fracture shows mild displacement. No dislocation. No other fractures are identified. No bony lesions are seen. Vascular calcifications are seen in the femoral arteries bilaterally. IMPRESSION: Acute intratrochanteric fracture of the right hip with mild displacement. Electronically Signed   By: Aletta Edouard M.D.   On: 05/03/2015 18:07   ASSESSMENT AND PLAN:  Amanda Stark is a 82 y.o. female with a known history of Hypertension, hyperlipidemia and A. fib. The patient has had the poor oral intake and generalized weakness for the past a few days. She fell to the floor in nursing home today. She complained of right hip pain, unable to move  1.Right hip fracture s/p surgery POD#1 - She has moderate to high risk for hip surgery -Pain control - PT and DVT prophylaxis after surgery. -appreciate Dr Nicholaus Bloom input  2.Hyponatremia and dehydration.   3.Leukocytosis. No source of infection noted  4.Diabetes. resume Levemir and metformin, and start sliding scale.  5.Hypertension. Continue Norvasc and Lopressor.  6.History of A. fib. Continue sotalol and Lopressor.  7.Anemia of chronic disease. Stable. getting 1 unit BT  Case discussed with Care Management/Social Worker. Management plans discussed with the patient, family and they are in agreement.  CODE STATUS: dnr  DVT Prophylaxis: per ortho  TOTAL TIME TAKING CARE OF THIS PATIENT: 30  minutes.  >50% time spent on counselling and coordination of care family, dr Roland Rack  POSSIBLE D/C IN 1-2 DAYS, DEPENDING ON CLINICAL CONDITION.  Note: This dictation was prepared with Dragon dictation along with smaller phrase technology. Any transcriptional errors that result from this process are unintentional.  Arnold Depinto M.D on 05/05/2015 at 1:41 PM  Between 7am to 6pm - Pager - [phone]  After 6pm go to [email or web address] - password [email or web address] [facility]  Office  [phone]  CC: Primary care physician; Pcp Not In System

## 2015-05-06 LAB — URINALYSIS COMPLETE WITH MICROSCOPIC (ARMC ONLY)
BACTERIA UA: NONE SEEN
Bilirubin Urine: NEGATIVE
Glucose, UA: 50 mg/dL — AB
LEUKOCYTES UA: NEGATIVE
Nitrite: NEGATIVE
PROTEIN: 100 mg/dL — AB
SPECIFIC GRAVITY, URINE: 1.015 (ref 1.005–1.030)
SQUAMOUS EPITHELIAL / LPF: NONE SEEN
pH: 5 (ref 5.0–8.0)

## 2015-05-06 LAB — GLUCOSE, CAPILLARY
GLUCOSE-CAPILLARY: 88 mg/dL (ref 65–99)
Glucose-Capillary: 161 mg/dL — ABNORMAL HIGH (ref 65–99)
Glucose-Capillary: 166 mg/dL — ABNORMAL HIGH (ref 65–99)

## 2015-05-06 LAB — CBC
HCT: 25.4 % — ABNORMAL LOW (ref 35.0–47.0)
HEMOGLOBIN: 8.5 g/dL — AB (ref 12.0–16.0)
MCH: 28 pg (ref 26.0–34.0)
MCHC: 33.3 g/dL (ref 32.0–36.0)
MCV: 84.2 fL (ref 80.0–100.0)
Platelets: 233 10*3/uL (ref 150–440)
RBC: 3.02 MIL/uL — ABNORMAL LOW (ref 3.80–5.20)
RDW: 13.7 % (ref 11.5–14.5)
WBC: 20.7 10*3/uL — AB (ref 3.6–11.0)

## 2015-05-06 LAB — TYPE AND SCREEN
ABO/RH(D): A POS
Antibody Screen: NEGATIVE
UNIT DIVISION: 0

## 2015-05-06 LAB — BASIC METABOLIC PANEL
ANION GAP: 6 (ref 5–15)
BUN: 18 mg/dL (ref 6–20)
CALCIUM: 8.1 mg/dL — AB (ref 8.9–10.3)
CO2: 27 mmol/L (ref 22–32)
Chloride: 94 mmol/L — ABNORMAL LOW (ref 101–111)
Creatinine, Ser: 0.52 mg/dL (ref 0.44–1.00)
GFR calc Af Amer: >60 mL/min (ref >60)
GLUCOSE: 175 mg/dL — AB (ref 65–99)
POTASSIUM: 3.9 mmol/L (ref 3.5–5.1)
SODIUM: 127 mmol/L — AB (ref 135–145)

## 2015-05-06 MED ORDER — LORAZEPAM 2 MG/ML IJ SOLN
0.5000 mg | Freq: Once | INTRAMUSCULAR | Status: AC
  Administered 2015-05-06: 0.5 mg via INTRAVENOUS
  Filled 2015-05-06: qty 1

## 2015-05-06 NOTE — Progress Notes (Signed)
Pts. Been very confused combative and agitated throughout the night. Pt. Pulled out her IV and staff attempted to restart IV with no success. Pt. Was fighting and kicking at staff as they were attempting to start another site. Kicking and biting at staff as staff was trying to take pts. Vitals. Pt. Pulled off surgical dressing and SCD's. Ativan x1 given and pt. Slept for a short while.

## 2015-05-06 NOTE — Progress Notes (Signed)
   Subjective: 2 Days Post-Op Procedure(s) (LRB): INTRAMEDULLARY (IM) NAIL INTERTROCHANTRIC (Right) Patient reports pain as 5 on 0-10 scale.Sleeping upon entering room.    Patient is confused.  Denies any CP, SOB, ABD pain. We will start therapy today.  Plan is to go Skilled nursing facility after hospital stay.  Objective: Vital signs in last 24 hours: Temp:  [96.5 F (35.8 C)-99.3 F (37.4 C)] 96.5 F (35.8 C) (04/08 0356) Pulse Rate:  [74-94] 92 (04/08 0356) Resp:  [16-21] 21 (04/08 0356) BP: (118-153)/(49-93) 148/93 mmHg (04/08 0356) SpO2:  [91 %-97 %] 94 % (04/08 0356)  Intake/Output from previous day: 04/07 0701 - 04/08 0700 In: 1672.3 [P.O.:240; I.V.:813; Blood:369.3; IV Piggyback:250] Out: 100 [Urine:100] Intake/Output this shift:     Recent Labs  05/03/15 1710 05/04/15 0444 05/05/15 0446 05/05/15 1450 05/06/15 0749  HGB 10.9* 8.6* 7.3* 8.4* 8.5*    Recent Labs  05/05/15 0446 05/05/15 1450 05/06/15 0749  WBC 16.6*  --  20.7*  RBC 2.61*  --  3.02*  HCT 22.2* 25.1* 25.4*  PLT 239  --  233    Recent Labs  05/04/15 0444 05/05/15 0446  NA 130* 131*  K 4.3 4.0  CL 92* 99*  CO2 32 27  BUN 31* 26*  CREATININE 0.90 0.59  GLUCOSE 164* 217*  CALCIUM 8.9 8.5*   No results for input(s): LABPT, INR in the last 72 hours.  EXAM General - Patient is Alert and Confused Extremity - Neurovascular intact Sensation intact distally Intact pulses distally Dorsiflexion/Plantar flexion intact  Leg is soft - Homans sign Dressing - dressing C/D/I and no drainage Motor Function - intact, moving foot and toes well on exam.   Past Medical History  Diagnosis Date  . SVT (supraventricular tachycardia) ([place])   . Thyroid disease   . Instability of right shoulder joint   . Glenohumeral arthritis   . Diabetes mellitus without complication ([place])   . Hypertension   . Hyperlipidemia   . PMR (polymyalgia rheumatica) ([facility])   . Lumbar spinal stenosis   .  Osteoporosis   . Thyroid cancer ([place])   . Esophageal cancer ([place])   . Vertigo     Assessment/Plan:   2 Days Post-Op Procedure(s) (LRB): INTRAMEDULLARY (IM) NAIL INTERTROCHANTRIC (Right) Principal Problem:   Closed right hip fracture ([facility])   Acute post op blood loss anemia     Estimated body mass index is 18.43 kg/(m^2) as calculated from the following:   Height as of this encounter: 5\' 3"  (1.6 m).   Weight as of this encounter: 47.174 kg (104 lb). Advance diet Up with therapy , WBAT RLE Needs BM Medicine follwing Hgb improved s/p 1 unit PRBC yesterday  DVT Prophylaxis - Lovenox, Foot Pumps and TED hose Weight-Bearing as tolerated to right leg D/C O2 and Pulse OX and try on Room Air  T. Rachelle Hora, PA-C [place] 05/06/2015, 8:01 AM

## 2015-05-06 NOTE — Progress Notes (Signed)
Physical Therapy Treatment Patient Details Name: Amanda Stark MRN: [id number] DOB: 09-10-1922 Today's Date: 05/06/2015    History of Present Illness Pt here after fall with R hip fx and subsequent ORIF.  She has had a L lower leg injury since February and has only been able to do some limited transfers to w/c and BSC.  Pt's HGB was 7.3 at eval, pt with hospice services from [facility].    PT Comments    Pt continues to be very lethargic and confused.  She has moments where she was able to open her eyes and assist minimally with AAROM exercises, but mostly she was unable to meaningfully participate and PROM was all we were able to accomplish.  Daughter present and assists in trying to get pt to participate with limited success. Pt falling alseep or confused much of the time.   Follow Up Recommendations  SNF     Equipment Recommendations       Recommendations for Other Services       Precautions / Restrictions Precautions Precautions: Fall Restrictions RLE Weight Bearing: Weight bearing as tolerated    Mobility  Bed Mobility               General bed mobility comments: deferred all mobiltiy acts as pt was confused/lethargic  Transfers                    Ambulation/Gait                 Stairs            Wheelchair Mobility    Modified Rankin (Stroke Patients Only)       Balance                                    Cognition Arousal/Alertness: Lethargic (daughter reports that she "just woke up") Behavior During Therapy: Restless;Impulsive Overall Cognitive Status: Difficult to assess                      Exercises General Exercises - Lower Extremity Ankle Circles/Pumps: AAROM;PROM;10 reps Short Arc Quad: PROM;10 reps Heel Slides: AAROM;PROM;15 reps Hip ABduction/ADduction: AAROM;PROM;15 reps    General Comments        Pertinent Vitals/Pain Pain Assessment:  (per usual, indicated pain w/ mvt, confused  situationally)    Home Living                      Prior Function            PT Goals (current goals can now be found in the care plan section)      Frequency  BID    PT Plan Current plan remains appropriate    Co-evaluation             End of Session   Activity Tolerance: Patient limited by lethargy Patient left: with bed alarm set;with call bell/phone within reach;with family/visitor present     Time: [id number] PT Time Calculation (min) (ACUTE ONLY): 11 min  Charges:  $Therapeutic Exercise: 8-22 mins                    G Codes:     Wayne Both, PT, DPT [phone]  Kreg Shropshire 05/06/2015, 5:21 PM

## 2015-05-06 NOTE — Progress Notes (Signed)
Physical Therapy Treatment Patient Details Name: Amanda Stark MRN: [id number] DOB: Mar 18, 1922 Today's Date: 05/06/2015    History of Present Illness Pt here after fall with R hip fx and subsequent ORIF.  She has had a L lower leg injury since February and has only been able to do some limited transfers to w/c and BSC.  Pt's HGB was 7.3 at eval, pt with hospice services from [facility].    PT Comments    Pt is confused, agitated but hardly able to open her eyes.  She struggles to do any exercises and most are PROM, though she shows occasional AAROM effort with a few reps here and there.  Pt apparently was even more agitated earlier this AM, will try back this afternoon and see it pt is better able to participate.   Follow Up Recommendations  SNF     Equipment Recommendations       Recommendations for Other Services       Precautions / Restrictions Precautions Precautions: Fall Restrictions RLE Weight Bearing: Weight bearing as tolerated    Mobility  Bed Mobility               General bed mobility comments: deferred all mobiltiy acts as pt was confused, lethargic and agitated  Transfers                    Ambulation/Gait                 Stairs            Wheelchair Mobility    Modified Rankin (Stroke Patients Only)       Balance                                    Cognition Arousal/Alertness: Lethargic Behavior During Therapy: Agitated;Restless Overall Cognitive Status: Difficult to assess                      Exercises General Exercises - Lower Extremity Ankle Circles/Pumps: PROM;10 reps Quad Sets: AAROM;10 reps Short Arc Quad: PROM;10 reps Heel Slides: PROM;10 reps Hip ABduction/ADduction: PROM;10 reps    General Comments        Pertinent Vitals/Pain Pain Assessment:  (unable to rate, c/o pain with all PROM acts)    Home Living                      Prior Function            PT  Goals (current goals can now be found in the care plan section) Progress towards PT goals: Not progressing toward goals - comment (pt lethargic and agitated)    Frequency  BID    PT Plan Current plan remains appropriate    Co-evaluation             End of Session Equipment Utilized During Treatment: Gait belt Activity Tolerance: Patient limited by lethargy;Treatment limited secondary to agitation Patient left: with bed alarm set;with call bell/phone within reach     Time: 1000-1014 PT Time Calculation (min) (ACUTE ONLY): 14 min  Charges:  $Therapeutic Exercise: 8-22 mins                    G Codes:     Amanda Stark, PT, DPT [phone]  Amanda Stark 05/06/2015, 12:54 PM

## 2015-05-06 NOTE — Progress Notes (Signed)
Patient has been combative this morning and has refused all care, meds, and food. Nurse will try to encourage care later in the morning.

## 2015-05-06 NOTE — Progress Notes (Signed)
Patient ID: Amanda Stark, female   DOB: 12-Sep-1922, 82 y.o.   MRN: [id number] Amanda Stark NAME: Amanda Stark    MR#:  [id number]  DATE OF BIRTH:  [birth year]  SUBJECTIVE:   POD #2 Patient Currently lethargic was very agitated last night and had to receive sedation  REVIEW OF SYSTEMS:   Review of Systems  Sleepy due to medications  DRUG ALLERGIES:   Allergies  Allergen Reactions  . Ascriptin A-D [Aspirin Buf(Alhyd-Mghyd-Cacar)] Other (See Comments)    Reaction:  Unknown   . Aspirin Other (See Comments)    Reaction:  Unknown   . Codeine Other (See Comments)    Reaction:  Unknown   . Hydrocodone Other (See Comments)    Reaction:  Unknown   . Statins Other (See Comments)    Reaction:  Muscle pain    . Tramadol Nausea And Vomiting    VITALS:  Blood pressure 179/73, pulse 83, temperature 98.6 F (37 C), temperature source Oral, resp. rate 18, height 5\' 3"  (1.6 m), weight 47.174 kg (104 lb), SpO2 91 %.  PHYSICAL EXAMINATION:   Physical Exam  GENERAL:  82 y.o.-year-old patient lying in the bed with no acute distress.  EYES: Pupils equal, round, reactive to light and accommodation. No scleral icterus. HEENT: Head atraumatic, normocephalic. Oropharynx and nasopharynx clear.  NECK:  Supple, no jugular venous distention. No thyroid enlargement, no tenderness.  LUNGS: Normal breath sounds bilaterally, no wheezing, rales, rhonchi. No use of accessory muscles of respiration.  CARDIOVASCULAR: S1, S2 normal. No murmurs, rubs, or gallops.  ABDOMEN: Soft, nontender, nondistended. Bowel sounds present. No organomegaly or mass.  EXTREMITIES: No cyanosis, clubbing or edema b/l.   right leg surgical scar looks ok NEUROLOGIC: Currently sleeping and left for PSYCHIATRIC:  Lethargic  SKIN: No obvious rash, lesion, or ulcer.   LABORATORY PANEL:  CBC  Recent Labs Lab 05/06/15 0749  WBC 20.7*  HGB 8.5*  HCT 25.4*  PLT 233     Chemistries   Recent Labs Lab 05/06/15 0749  NA 127*  K 3.9  CL 94*  CO2 27  GLUCOSE 175*  BUN 18  CREATININE 0.52  CALCIUM 8.1*   Cardiac Enzymes No results for input(s): TROPONINI in the last 168 hours. RADIOLOGY:  Dg Hip Operative Unilat W Or W/o Pelvis Right  05/04/2015  CLINICAL DATA:  ORIF right femur EXAM: OPERATIVE right HIP (WITH PELVIS IF PERFORMED) 4 VIEWS TECHNIQUE: Fluoroscopic spot image(s) were submitted for interpretation post-operatively. COMPARISON:  05/03/2015 FINDINGS: Four views show gamma nail fixation of a trochanteric region fracture on the right. Components appear well positioned. No radiographically detectable complication. IMPRESSION: ORIF right trochanteric region fracture. Electronically Signed   By: Nelson Chimes M.D.   On: 05/04/2015 15:45   ASSESSMENT AND PLAN:  Amanda Stark is a 82 y.o. female with a known history of Hypertension, hyperlipidemia and A. fib. The patient has had the poor oral intake and generalized weakness for the past a few days. She fell to the floor in nursing home today. She complained of right hip pain, unable to move  1.Right hip fracture s/p surgery POD#2 -Pain control - PT and DVT prophylaxis after surgery.  2  . Acute delirium suspect due to advanced age and hospitalization supportive care  3.Leukocytosis. Unclear source I will order a urinalysis and a chest x-ray  4.Diabetes. Blood sugar slightly elevated currently on D5 and normal saline fluid due to poor by  mouth intake continue this for now  5.Hypertension. Continue Norvasc and Lopressor.  6.History of A. fib. Continue sotalol and Lopressor.  7.Anemia of chronic disease. Stable. Status post 1 unit of packed RBCs repeat CBC in the morning  Case discussed with daughter at bedside.  CODE STATUS: dnr  DVT Prophylaxis: per ortho  TOTAL TIME TAKING CARE OF THIS PATIENT: 30 minutes.  >50% time spent on counselling and coordination of care family   Note:  This dictation was prepared with Dragon dictation along with smaller phrase technology. Any transcriptional errors that result from this process are unintentional.  Dustin Flock M.D on 05/06/2015 at 12:33 PM  Between 7am to 6pm - Pager - [phone]  After 6pm go to [email or web address] - password [email or web address] [facility]  Office  [phone]  CC: Primary care physician; Pcp Not In System

## 2015-05-06 NOTE — Progress Notes (Signed)
Pt. Confused and combative. Dr. Jannifer Franklin notified and ordered ativan 0.5mg  IV once.

## 2015-05-07 ENCOUNTER — Inpatient Hospital Stay

## 2015-05-07 LAB — CBC WITH DIFFERENTIAL/PLATELET
BASOS ABS: 0.1 10*3/uL (ref 0–0.1)
Basophils Relative: 1 %
Eosinophils Absolute: 0.2 10*3/uL (ref 0–0.7)
Eosinophils Relative: 2 %
HEMATOCRIT: 24.7 % — AB (ref 35.0–47.0)
Hemoglobin: 8.3 g/dL — ABNORMAL LOW (ref 12.0–16.0)
LYMPHS PCT: 13 %
Lymphs Abs: 1.8 10*3/uL (ref 1.0–3.6)
MCH: 28.1 pg (ref 26.0–34.0)
MCHC: 33.8 g/dL (ref 32.0–36.0)
MCV: 83 fL (ref 80.0–100.0)
MONO ABS: 1.6 10*3/uL — AB (ref 0.2–0.9)
MONOS PCT: 12 %
NEUTROS ABS: 9.9 10*3/uL — AB (ref 1.4–6.5)
Neutrophils Relative %: 72 %
Platelets: 253 10*3/uL (ref 150–440)
RBC: 2.97 MIL/uL — ABNORMAL LOW (ref 3.80–5.20)
RDW: 13.7 % (ref 11.5–14.5)
WBC: 13.7 10*3/uL — AB (ref 3.6–11.0)

## 2015-05-07 LAB — GLUCOSE, CAPILLARY
GLUCOSE-CAPILLARY: 230 mg/dL — AB (ref 65–99)
GLUCOSE-CAPILLARY: 198 mg/dL — AB (ref 65–99)
Glucose-Capillary: 114 mg/dL — ABNORMAL HIGH (ref 65–99)
Glucose-Capillary: 109 mg/dL — ABNORMAL HIGH (ref 65–99)

## 2015-05-07 LAB — BASIC METABOLIC PANEL
ANION GAP: 3 — AB (ref 5–15)
BUN: 22 mg/dL — ABNORMAL HIGH (ref 6–20)
CHLORIDE: 98 mmol/L — AB (ref 101–111)
CO2: 31 mmol/L (ref 22–32)
CREATININE: 0.75 mg/dL (ref 0.44–1.00)
Calcium: 8.4 mg/dL — ABNORMAL LOW (ref 8.9–10.3)
GFR calc non Af Amer: >60 mL/min (ref >60)
Glucose, Bld: 128 mg/dL — ABNORMAL HIGH (ref 65–99)
Potassium: 3.4 mmol/L — ABNORMAL LOW (ref 3.5–5.1)
SODIUM: 132 mmol/L — AB (ref 135–145)

## 2015-05-07 MED ORDER — LEVOFLOXACIN 500 MG PO TABS
250.0000 mg | ORAL_TABLET | Freq: Every day | ORAL | Status: DC
  Administered 2015-05-07 – 2015-05-08 (×2): 250 mg via ORAL
  Filled 2015-05-07 (×2): qty 1

## 2015-05-07 NOTE — Progress Notes (Signed)
   Subjective: 3 Days Post-Op Procedure(s) (LRB): INTRAMEDULLARY (IM) NAIL INTERTROCHANTRIC (Right) Patient reports pain in her right leg. Pain is improving.  Patient is well, and has had no acute complaints or problems Denies any CP, SOB, ABD pain. We will continue therapy today.  Plan is to go Skilled nursing facility after hospital stay.  Objective: Vital signs in last 24 hours: Temp:  [98 F (36.7 C)-99.8 F (37.7 C)] 98.9 F (37.2 C) (04/09 0739) Pulse Rate:  [74-114] 114 (04/09 0739) Resp:  [16-20] 16 (04/09 0739) BP: (109-179)/(43-89) 137/89 mmHg (04/09 0739) SpO2:  [91 %-100 %] 100 % (04/09 0739)  Intake/Output from previous day: 04/08 0701 - 04/09 0700 In: 340 [P.O.:340] Out: -  Intake/Output this shift:     Recent Labs  05/05/15 0446 05/05/15 1450 05/06/15 0749 05/07/15 0350  HGB 7.3* 8.4* 8.5* 8.3*    Recent Labs  05/06/15 0749 05/07/15 0350  WBC 20.7* 13.7*  RBC 3.02* 2.97*  HCT 25.4* 24.7*  PLT 233 253    Recent Labs  05/06/15 0749 05/07/15 0350  NA 127* 132*  K 3.9 3.4*  CL 94* 98*  CO2 27 31  BUN 18 22*  CREATININE 0.52 0.75  GLUCOSE 175* 128*  CALCIUM 8.1* 8.4*   No results for input(s): LABPT, INR in the last 72 hours.  EXAM General - Patient is Alert and Confused Extremity - Neurovascular intact Sensation intact distally Intact pulses distally Dorsiflexion/Plantar flexion intact  Leg is soft - Homans sign Dressing - dressing C/D/I and scant drainage Motor Function - intact, moving foot and toes well on exam.   Past Medical History  Diagnosis Date  . SVT (supraventricular tachycardia) ([place])   . Thyroid disease   . Instability of right shoulder joint   . Glenohumeral arthritis   . Diabetes mellitus without complication ([place])   . Hypertension   . Hyperlipidemia   . PMR (polymyalgia rheumatica) ([facility])   . Lumbar spinal stenosis   . Osteoporosis   . Thyroid cancer ([place])   . Esophageal cancer ([place])   . Vertigo      Assessment/Plan:   3 Days Post-Op Procedure(s) (LRB): INTRAMEDULLARY (IM) NAIL INTERTROCHANTRIC (Right) Principal Problem:   Closed right hip fracture (HCC)   Acute post op blood loss anemia    Estimated body mass index is 18.43 kg/(m^2) as calculated from the following:   Height as of this encounter: 5\' 3"  (1.6 m).   Weight as of this encounter: 47.174 kg (104 lb). Advance diet Up with therapy , WBAT RLE Needs BM Medicine follwing Hgb stable  DVT Prophylaxis - Lovenox, Foot Pumps and TED hose Weight-Bearing as tolerated to right leg D/C O2 and Pulse OX and try on Room Air  T. Rachelle Hora, PA-C [place] 05/07/2015, 8:04 AM

## 2015-05-07 NOTE — Progress Notes (Signed)
Patient ID: Amanda Stark, female   DOB: 02/06/1922, 82 y.o.   MRN: [id number] [place] [facility] [place] NAME: Amanda Stark    MR#:  [id number]  DATE OF BIRTH:  27-Nov-1922  SUBJECTIVE:   POD #3 Patient more awake able to follow commands   REVIEW OF SYSTEMS:   Review of Systems  Confusion  DRUG ALLERGIES:   Allergies  Allergen Reactions  . Ascriptin A-D [Aspirin Buf(Alhyd-Mghyd-Cacar)] Other (See Comments)    Reaction:  Unknown   . Aspirin Other (See Comments)    Reaction:  Unknown   . Codeine Other (See Comments)    Reaction:  Unknown   . Hydrocodone Other (See Comments)    Reaction:  Unknown   . Statins Other (See Comments)    Reaction:  Muscle pain    . Tramadol Nausea And Vomiting    VITALS:  Blood pressure 137/89, pulse 114, temperature 98.9 F (37.2 C), temperature source Oral, resp. rate 16, height 5\' 3"  (1.6 m), weight 47.174 kg (104 lb), SpO2 100 %.  PHYSICAL EXAMINATION:   Physical Exam  GENERAL:  82 y.o.-year-old patient lying in the bed with no acute distress.  EYES: Pupils equal, round, reactive to light and accommodation. No scleral icterus. HEENT: Head atraumatic, normocephalic. Oropharynx and nasopharynx clear.  NECK:  Supple, no jugular venous distention. No thyroid enlargement, no tenderness.  LUNGS: Normal breath sounds bilaterally, no wheezing, rales, rhonchi. No use of accessory muscles of respiration.  CARDIOVASCULAR: S1, S2 normal. No murmurs, rubs, or gallops.  ABDOMEN: Soft, nontender, nondistended. Bowel sounds present. No organomegaly or mass.  EXTREMITIES: No cyanosis, clubbing or edema b/l.   right leg surgical scar looks ok NEUROLOGIC: Currently sleeping and left for PSYCHIATRIC:  Lethargic  SKIN: No obvious rash, lesion, or ulcer.   LABORATORY PANEL:  CBC  Recent Labs Lab 05/07/15 0350  WBC 13.7*  HGB 8.3*  HCT 24.7*  PLT 253    Chemistries   Recent Labs Lab 05/07/15 0350   NA 132*  K 3.4*  CL 98*  CO2 31  GLUCOSE 128*  BUN 22*  CREATININE 0.75  CALCIUM 8.4*   Cardiac Enzymes No results for input(s): TROPONINI in the last 168 hours. RADIOLOGY:  Dg Chest 1 View  05/07/2015  CLINICAL DATA:  82 year old with leukocytosis. EXAM: Portable CHEST 1 VIEW COMPARISON:  05/03/2015 and earlier, including CT chest 01/10/2012. FINDINGS: Cardiac silhouette mildly enlarged, unchanged. Moderate-sized hiatal hernia, unchanged. Chronic interstitial pulmonary opacities, unchanged. Airspace opacities in the left lower lobe. Lungs otherwise clear. Multiple old healed right rib fractures with associated pleural reaction as noted previously. IMPRESSION: 1. Left lower lobe atelectasis and/or pneumonia. 2. Stable mild cardiomegaly without pulmonary edema. Electronically Signed   By: Evangeline Dakin M.D.   On: 05/07/2015 08:01   ASSESSMENT AND PLAN:  Amanda Stark is a 82 y.o. female with a known history of Hypertension, hyperlipidemia and A. fib. The patient has had the poor oral intake and generalized weakness for the past a few days. She fell to the floor in nursing home today. She complained of right hip pain, unable to move  1.Right hip fracture s/p surgery POD#3 -Pain control - Continue physical therapy - Lovenox for DVT prophylaxis  2  . Acute delirium suspect due to advanced age and hospitalization supportive care- improving  3.Leukocytosis. Trending down today, urinalysis no evidence of infection, chest x-ray shows atelectasis and possible pneumonia- we'll start oral Levaquin  4.Diabetes. Blood  sugar normal currently on D5 I will discontinue the D5 and hold Lantus   5.Hypertension. Continue Norvasc and Lopressor.  6.History of A. fib. Continue sotalol and Lopressor.  7.Anemia of chronic disease. Stable. Status post 1 unit of packed RBCs Hemoglobin stable  Case discussed with daughter  CODE STATUS: dnr  DVT Prophylaxis: per ortho  TOTAL TIME TAKING CARE OF  THIS PATIENT: 30 minutes.  >50% time spent on counselling and coordination of care family   Note: This dictation was prepared with Dragon dictation along with smaller phrase technology. Any transcriptional errors that result from this process are unintentional.  Dustin Flock M.D on 05/07/2015 at 9:48 AM  Between 7am to 6pm - Pager - [phone]  After 6pm go to [email or web address] - password [email or web address] [facility] [facility]  Office  [phone]  CC: Primary care physician; Pcp Not In System

## 2015-05-07 NOTE — Progress Notes (Signed)
Physical Therapy Treatment Patient Details Name: Amanda Stark MRN: [id number] DOB: 10-11-1922 Today's Date: 05/07/2015    History of Present Illness Pt here after fall with R hip fx and subsequent ORIF.  She has had a L lower leg injury since February and has only been able to do some limited transfers to w/c and BSC.  Pt's HGB was 7.3 at eval, pt with hospice services from [facility].    PT Comments    Pt was finally awake and alert enough to participate and though she is still very limited with what she able to do she does show good effort getting to standing and was actually able to take a few steps and show effort with exercises.    Follow Up Recommendations  SNF     Equipment Recommendations       Recommendations for Other Services       Precautions / Restrictions Precautions Precautions: Fall Restrictions RLE Weight Bearing: Weight bearing as tolerated    Mobility  Bed Mobility   Bed Mobility: Supine to Sit     Supine to sit: Max assist     General bed mobility comments: Pt needs considerable assist to get to EOB, maintains sitting balance but needs close CGA  Transfers Overall transfer level: Needs assistance Equipment used: Rolling walker (2 wheeled) Transfers: Sit to/from Stand Sit to Stand: Max assist         General transfer comment: Pt does much better with standing today and is actually able to maintain standing balance and use walker to keep herself upright  Ambulation/Gait Ambulation/Gait assistance: Max assist Ambulation Distance (Feet): 3 Feet Assistive device: Rolling walker (2 wheeled)       General Gait Details: Pt does better than expected and was actually able to attempt a few small steps.  She still required a lot of assist, cuing and encouragement but did show good effort with moving LEs/taking steps.    Stairs            Wheelchair Mobility    Modified Rankin (Stroke Patients Only)       Balance                                    Cognition Arousal/Alertness: Awake/alert Behavior During Therapy: WFL for tasks assessed/performed Overall Cognitive Status:  (pt continues to have some confusion)                      Exercises General Exercises - Lower Extremity Ankle Circles/Pumps: AAROM;PROM;10 reps Quad Sets: AAROM;10 reps Gluteal Sets: Strengthening;10 reps Short Arc Quad: PROM;10 reps Heel Slides: AAROM;PROM;15 reps Hip ABduction/ADduction: AAROM;PROM;15 reps    General Comments        Pertinent Vitals/Pain Pain Assessment:  (Pt indicates pain with hip movement, but tolerates session)    Home Living                      Prior Function            PT Goals (current goals can now be found in the care plan section) Progress towards PT goals: Progressing toward goals    Frequency  BID    PT Plan Current plan remains appropriate    Co-evaluation             End of Session Equipment Utilized During Treatment: Gait belt Activity Tolerance: Patient tolerated treatment well  Patient left: with chair alarm set;with call bell/phone within reach;with family/visitor present     Time: 0930-1000 PT Time Calculation (min) (ACUTE ONLY): 30 min  Charges:  $Gait Training: 8-22 mins $Therapeutic Exercise: 8-22 mins                    G Codes:     Amanda Stark, PT, DPT [phone]  Amanda Stark 05/07/2015, 12:00 PM

## 2015-05-08 ENCOUNTER — Encounter
Admission: RE | Admit: 2015-05-08 | Discharge: 2015-05-08 | Disposition: A | Discharge status: Home / Self Care | Payer: No Typology Code available for payment source | Source: Ambulatory Visit | Attending: Internal Medicine | Admitting: Internal Medicine

## 2015-05-08 DIAGNOSIS — E871 Hypo-osmolality and hyponatremia: Secondary | ICD-10-CM | POA: Insufficient documentation

## 2015-05-08 DIAGNOSIS — D649 Anemia, unspecified: Secondary | ICD-10-CM | POA: Insufficient documentation

## 2015-05-08 DIAGNOSIS — R410 Disorientation, unspecified: Secondary | ICD-10-CM | POA: Insufficient documentation

## 2015-05-08 DIAGNOSIS — E119 Type 2 diabetes mellitus without complications: Secondary | ICD-10-CM | POA: Insufficient documentation

## 2015-05-08 LAB — GLUCOSE, CAPILLARY
GLUCOSE-CAPILLARY: 150 mg/dL — AB (ref 65–99)
GLUCOSE-CAPILLARY: 293 mg/dL — AB (ref 65–99)
GLUCOSE-CAPILLARY: 363 mg/dL — AB (ref 65–99)
Glucose-Capillary: 175 mg/dL — ABNORMAL HIGH (ref 65–99)
Glucose-Capillary: 118 mg/dL — ABNORMAL HIGH (ref 65–99)

## 2015-05-08 MED ORDER — INSULIN DETEMIR 100 UNIT/ML ~~LOC~~ SOLN
3.0000 [IU] | Freq: Two times a day (BID) | SUBCUTANEOUS | Status: AC
Start: 2015-05-08 — End: ?

## 2015-05-08 MED ORDER — POTASSIUM CHLORIDE 20 MEQ PO PACK
40.0000 meq | PACK | Freq: Once | ORAL | Status: AC
  Administered 2015-05-08: 40 meq via ORAL
  Filled 2015-05-08: qty 2

## 2015-05-08 MED ORDER — LORAZEPAM 1 MG PO TABS: 1.0000 mg | ORAL_TABLET | Freq: Every day | ORAL | Status: AC

## 2015-05-08 MED ORDER — OXYCODONE HCL 5 MG PO TABS: 5.0000 mg | ORAL_TABLET | ORAL | Status: DC | PRN

## 2015-05-08 MED ORDER — DOCUSATE SODIUM 100 MG PO CAPS: 100.0000 mg | ORAL_CAPSULE | Freq: Two times a day (BID) | ORAL | Status: AC

## 2015-05-08 MED ORDER — LEVOFLOXACIN 250 MG PO TABS: 250.0000 mg | ORAL_TABLET | Freq: Every day | ORAL | Status: AC

## 2015-05-08 MED ORDER — BISACODYL 10 MG RE SUPP: 10.0000 mg | Freq: Every day | RECTAL | Status: AC | PRN

## 2015-05-08 MED ORDER — ENOXAPARIN SODIUM 40 MG/0.4ML ~~LOC~~ SOLN: 40.0000 mg | SUBCUTANEOUS | Status: DC

## 2015-05-08 MED ORDER — TRIAMCINOLONE ACETONIDE 40 MG/ML IJ SUSP
40.0000 mg | Freq: Once | INTRAMUSCULAR | Status: AC
  Administered 2015-05-08: 40 mg via INTRA_ARTICULAR
  Filled 2015-05-08: qty 1

## 2015-05-08 MED ORDER — BUPIVACAINE HCL (PF) 0.5 % IJ SOLN
10.0000 mL | Freq: Once | INTRAMUSCULAR | Status: AC
  Administered 2015-05-08: 10 mL via INTRA_ARTICULAR
  Filled 2015-05-08: qty 10

## 2015-05-08 NOTE — Progress Notes (Addendum)
Right shoulder aspiration and Steroid Injection:  The appropriate shoulder was identified and patient identifiers were acknowledged prior to the procedure.  The lateral aspect of the right shoulder was cleaned with an alcohol swab prior to topical anesthetic was obtained with ethyl chloride spray.  Attempted aspiration of the subacromial space was not successful.  The anterior aspect of the shoulder was then cleaned with alcohol swab prior to topical anesthetic obtained once again with ethyl chloride spray.  The shoulder was then aspirated of 2 cc's of synovial fluid with a 21 gauge needle.  The shoulder joint was then injected with 1 cc of Kenalog-40 and 4 cc of 0.5% Marcaine before a bandage was placed over both injection sites.  The patient tolerated the procedure well with no complications.  Raquel Lyell Clugston, PA-C [employer]

## 2015-05-08 NOTE — Progress Notes (Signed)
Subjective: 4 Days Post-Op Procedure(s) (LRB): INTRAMEDULLARY (IM) NAIL INTERTROCHANTRIC (Right) Patient reports pain as mild.   Patient is well, and has had no acute complaints or problems Plan is to go Skilled nursing facility after hospital stay. Negative for chest pain and shortness of breath Fever: no Gastrointestinal:Negative for nausea and vomiting this AM.  Objective: Vital signs in last 24 hours: Temp:  [98.4 F (36.9 C)-99.5 F (37.5 C)] 98.4 F (36.9 C) (04/10 0514) Pulse Rate:  [73-88] 79 (04/10 0514) Resp:  [16-18] 16 (04/10 0514) BP: (135-154)/(48-67) 154/67 mmHg (04/10 0514) SpO2:  [97 %-100 %] 100 % (04/10 0514)  Intake/Output from previous day:  Intake/Output Summary (Last 24 hours) at 05/08/15 0740 Last data filed at 05/08/15 0400  Gross per 24 hour  Intake    840 ml  Output    150 ml  Net    690 ml    Intake/Output this shift:    Labs:  Recent Labs  05/05/15 1450 05/06/15 0749 05/07/15 0350  HGB 8.4* 8.5* 8.3*    Recent Labs  05/06/15 0749 05/07/15 0350  WBC 20.7* 13.7*  RBC 3.02* 2.97*  HCT 25.4* 24.7*  PLT 233 253    Recent Labs  05/06/15 0749 05/07/15 0350  NA 127* 132*  K 3.9 3.4*  CL 94* 98*  CO2 27 31  BUN 18 22*  CREATININE 0.52 0.75  GLUCOSE 175* 128*  CALCIUM 8.1* 8.4*   No results for input(s): LABPT, INR in the last 72 hours.   EXAM General - Patient is Alert and able to answer simple questions.  Delirium documented seems to have improved. Extremity - ABD soft Sensation intact distally Intact pulses distally Dorsiflexion/Plantar flexion intact Incision: Mild bloody drainage located at the most proximal incision No cellulitis present Dressing/Incision - blood tinged drainage Motor Function - intact, moving foot and toes well on exam.   Past Medical History  Diagnosis Date  . SVT (supraventricular tachycardia) ([place])   . Thyroid disease   . Instability of right shoulder joint   . Glenohumeral arthritis    . Diabetes mellitus without complication ([place])   . Hypertension   . Hyperlipidemia   . PMR (polymyalgia rheumatica) ([facility])   . Lumbar spinal stenosis   . Osteoporosis   . Thyroid cancer ([place])   . Esophageal cancer ([place])   . Vertigo     Assessment/Plan: 4 Days Post-Op Procedure(s) (LRB): INTRAMEDULLARY (IM) NAIL INTERTROCHANTRIC (Right) Principal Problem:   Closed right hip fracture (HCC)  Estimated body mass index is 18.43 kg/(m^2) as calculated from the following:   Height as of this encounter: 5\' 3"  (1.6 m).   Weight as of this encounter: 47.174 kg (104 lb). Advance diet Up with therapy   Hg stable at 8.3.  WBC trending down to 13.7.  Continue Levaquin Na improved to 132.  K+ 3.4, will supplement. Pt still has not had a BM.  Suppositories and FLEET enema on board. Upon discharge, continue Lovenox 40mg  daily for 14 days. Staples can be removed in 10-14 days.  Follow-up with Dr. Roland Rack in [place] office in 6 weeks.  DVT Prophylaxis - Lovenox, Foot Pumps and TED hose Weight-Bearing as tolerated to right leg  J. Cameron Proud, PA-C [facility] 05/08/2015, 7:40 AM

## 2015-05-08 NOTE — Progress Notes (Signed)
Patient is medically stable for D/C to [facility] today. Per Kim admissions coordinator at [facility] patient will go to room 211. RN will call report at [phone] and arrange EMS for transport. Clinical Education officer, museum (CSW) sent D/C Summary, FL2 and D/C Packet to [employer] via [employer]. Patient is aware of above. CSW contacted patient's son [name] and left him a voicemail making him aware of above. CSW also contacted patient's daughter [name] and made her aware of above. [place] liaison is aware of D/C today and will discharge patient from their services in order to go to rehab on skilled days. Please reconsult if future social work needs arise. CSW signing off.   Blima Rich, LCSW [phone]

## 2015-05-08 NOTE — Discharge Instructions (Signed)
°  DIET:  Diabetic diet, cardiac diet  DISCHARGE CONDITION:  Stable  ACTIVITY:  Activity as tolerated  OXYGEN:  Home Oxygen: No.   Oxygen Delivery: room air  DISCHARGE LOCATION:  home    ADDITIONAL DISCHARGE INSTRUCTION:   If you experience worsening of your admission symptoms, develop shortness of breath, life threatening emergency, suicidal or homicidal thoughts you must seek medical attention immediately by calling 911 or calling your MD immediately  if symptoms less severe.  You Must read complete instructions/literature along with all the possible adverse reactions/side effects for all the Medicines you take and that have been prescribed to you. Take any new Medicines after you have completely understood and accpet all the possible adverse reactions/side effects.   Please note  You were cared for by a hospitalist during your hospital stay. If you have any questions about your discharge medications or the care you received while you were in the hospital after you are discharged, you can call the unit and asked to speak with the hospitalist on call if the hospitalist that took care of you is not available. Once you are discharged, your primary care physician will handle any further medical issues. Please note that NO REFILLS for any discharge medications will be authorized once you are discharged, as it is imperative that you return to your primary care physician (or establish a relationship with a primary care physician if you do not have one) for your aftercare needs so that they can reassess your need for medications and monitor your lab values.

## 2015-05-08 NOTE — Progress Notes (Signed)
Informed by family that the patient never received her lunch tray. Dietary was contacted and we were told that they are "backed up." Nursing never received a call from dietary stating that the tray would be late. Family upset because now EMS is here to pick up the patient. Boxed lunch tray given to patient.

## 2015-05-08 NOTE — Progress Notes (Signed)
Visit made. Mrs. Tackitt was alert and interactive. Orthopedic surgeon and PA present as well as her son [name]. Plan is for discharge to [place] for short term rehab today. Revocation of Hospice benefit form signed by patient's daughter Amanda Stark. Hospice team updated. Thank you. Flo Shanks, RN, BSN, [facility] and [facility] [place], [facility] [phone] c

## 2015-05-08 NOTE — Clinical Social Work Placement (Signed)
   CLINICAL SOCIAL WORK PLACEMENT  NOTE  Date:  05/08/2015  Patient Details  Name: Amanda Stark MRN: [id number] Date of Birth: 1922-11-08  Clinical Social Work is seeking post-discharge placement for this patient at the [place] level of care (*CSW will initial, date and re-position this form in  chart as items are completed):  Yes   Patient/family provided with [place] Work Department's list of facilities offering this level of care within the geographic area requested by the patient (or if unable, by the patient's family).  Yes   Patient/family informed of their freedom to choose among providers that offer the needed level of care, that participate in Medicare, Medicaid or managed care program needed by the patient, have an available bed and are willing to accept the patient.  Yes   Patient/family informed of [place]'s ownership interest in [facility] and [facility], as well as of the fact that they are under no obligation to receive care at these facilities.  PASRR submitted to EDS on       PASRR number received on       Existing PASRR number confirmed on 05/04/15     FL2 transmitted to all facilities in geographic area requested by pt/family on 05/04/15     FL2 transmitted to all facilities within larger geographic area on       Patient informed that his/her managed care company has contracts with or will negotiate with certain facilities, including the following:        Yes   Patient/family informed of bed offers received.  Patient chooses bed at  [facility] )     Physician recommends and patient chooses bed at      Patient to be transferred to  [facility] ) on 05/08/15.  Patient to be transferred to facility by  [facility] )     Patient family notified on 05/08/15 of transfer.  Name of family member notified:   (Patient's daughter [name] is aware of D/C today. )     PHYSICIAN       Additional  Comment:    _______________________________________________ Loralyn Freshwater, LCSW 05/08/2015, 11:16 AM

## 2015-05-08 NOTE — Progress Notes (Signed)
Spoke with Janett Billow, [facility] rep at [phone], to notify of non-emergent EMS transport.  Auth notification reference given as [id number].   Service date range good from 05/08/15 - 08/06/15.   Gap exception requested to determine if services can be considered at an in-network level.

## 2015-05-08 NOTE — Progress Notes (Addendum)
Physical Therapy Treatment Patient Details Name: Amanda Stark MRN: [id number] DOB: 11-09-1922 Today's Date: 05/08/2015    History of Present Illness Pt here after fall with R hip fx and subsequent ORIF.  She has had a L lower leg injury since February and has only been able to do some limited transfers to w/c and BSC.  Pt's HGB was 7.3 at eval, pt with hospice services from [facility].    PT Comments    Pt with complaints of significant pain throughout right body. Pt also complains of fatigue. Pt refuses up in bed/out of bed at this time, but agreeable to supine bed exercises. Pt participates well with exercises with increased instruction and assist at times. Pt does not retain technique instructions to be able to perform without cueing/assist. Pt on room air throughout session with O2 sats at 97%; nursing notified and pt left on room air. Plan to see pt this afternoon with hopes of progressing bed mobility, transfers and ambulation to improve functional mobility.   Follow Up Recommendations  SNF     Equipment Recommendations       Recommendations for Other Services       Precautions / Restrictions Precautions Precautions: Fall Restrictions Weight Bearing Restrictions: Yes RLE Weight Bearing: Weight bearing as tolerated    Mobility  Bed Mobility               General bed mobility comments: Not tested; pt refuses out of bed/up in bed this morn  Transfers                    Ambulation/Gait                 Stairs            Wheelchair Mobility    Modified Rankin (Stroke Patients Only)       Balance                                    Cognition Arousal/Alertness: Awake/alert Behavior During Therapy: WFL for tasks assessed/performed Overall Cognitive Status: Within Functional Limits for tasks assessed                      Exercises General Exercises - Lower Extremity Ankle Circles/Pumps: AROM;Both;20  reps;Supine Quad Sets: Strengthening;Both;20 reps;Supine Gluteal Sets: Strengthening;Both;20 reps;Supine Short Arc Quad: AROM;Strengthening;Both;20 reps;Supine Heel Slides: AAROM;Right;Both;20 reps;Supine (AROM L) Hip ABduction/ADduction: AAROM;Both;20 reps;Supine Straight Leg Raises: AAROM;Both;10 reps;Supine (2 sets) Other Exercises Other Exercises: adductor squeeze 20x    General Comments        Pertinent Vitals/Pain Pain Assessment: 0-10 Pain Score: 9  Pain Location: R body Pain Descriptors / Indicators: Constant;Aching Pain Intervention(s): Limited activity within patient's tolerance;Monitored during session;Premedicated before session    Home Living                      Prior Function            PT Goals (current goals can now be found in the care plan section) Progress towards PT goals: Progressing toward goals (slowly)    Frequency  BID    PT Plan Current plan remains appropriate    Co-evaluation             End of Session Equipment Utilized During Treatment: Oxygen Activity Tolerance: Patient tolerated treatment well;Patient limited by pain Patient left: in bed;with call  bell/phone within reach;with bed alarm set     Time: 0939-1006 PT Time Calculation (min) (ACUTE ONLY): 27 min  Charges:  $Therapeutic Exercise: 23-37 mins                    G Codes:      Charlaine Dalton, PTA 05/08/2015, 10:26 AM

## 2015-05-08 NOTE — Care Management Important Message (Signed)
Important Message  Patient Details  Name: LILLION ORCUTT MRN: [id number] Date of Birth: 03-12-1922   Medicare Important Message Given:  Yes    [name] A Eryn Marandola 05/08/2015, 11:00 AM

## 2015-05-08 NOTE — Discharge Summary (Signed)
[name], 82 y.o., DOB 1922-08-06, MRN [id number]. Admission date: 05/03/2015 Discharge Date 05/08/2015 Primary MD Pcp Not In System Admitting Physician Demetrios Loll, MD  Admission Diagnosis  Closed right hip fracture, initial encounter [facility]) [S72.001A]  Discharge Diagnosis   Principal Problem: Closed right hip fracture ([place]) status post reduction and internal fixation of the right hip fracture Acute delirium Pneumonia Upper tension History of atrial fibrillation Anemia of chronic disease  History of SVT Polymyalgia rheumatica Diabetes type 2 History of thyroid cancer History of esophageal cancer Vertigo        Hospital Course Rashima Holstine is a 82 y.o. female with a known history of Hypertension, hyperlipidemia and A. fib. The patient has had the poor oral intake and generalized weakness for the past a few days. Patient fell on the floor. And was having pain in the hip. The patient was noted to have right hip fracture she was admitted to the hospital for further evaluation and treatment. She underwent reduction internal fixation of the right hip fracture. Patient was acutely delirious post procedure. However her mobility status is now improved. Patient's feeling much better more awake. And is stable for discharge to rehabilitation.            Consults  orthopedic surgery  Significant Tests:  See full reports for all details      Dg Chest 1 View  05/07/2015  CLINICAL DATA:  82 year old with leukocytosis. EXAM: Portable CHEST 1 VIEW COMPARISON:  05/03/2015 and earlier, including CT chest 01/10/2012. FINDINGS: Cardiac silhouette mildly enlarged, unchanged. Moderate-sized hiatal hernia, unchanged. Chronic interstitial pulmonary opacities, unchanged. Airspace opacities in the left lower lobe. Lungs otherwise clear. Multiple old healed right rib fractures with associated pleural reaction as noted previously. IMPRESSION: 1. Left lower lobe atelectasis and/or pneumonia. 2.  Stable mild cardiomegaly without pulmonary edema. Electronically Signed   By: Evangeline Dakin M.D.   On: 05/07/2015 08:01   Dg Chest 1 View  05/03/2015  CLINICAL DATA:  Fall today on right side with right hip fracture EXAM: CHEST 1 VIEW COMPARISON:  01/29/2015 FINDINGS: Cardiac shadow is again enlarged. A hiatal hernia is again seen and stable. Aortic calcifications are noted. The lungs are well-aerated without focal infiltrate. Multiple old rib fractures on the right are seen with chest wall deformity. IMPRESSION: Chronic changes without acute abnormality. Electronically Signed   By: Inez Catalina M.D.   On: 05/03/2015 18:07   Dg Hip Operative Unilat W Or W/o Pelvis Right  05/04/2015  CLINICAL DATA:  ORIF right femur EXAM: OPERATIVE right HIP (WITH PELVIS IF PERFORMED) 4 VIEWS TECHNIQUE: Fluoroscopic spot image(s) were submitted for interpretation post-operatively. COMPARISON:  05/03/2015 FINDINGS: Four views show gamma nail fixation of a trochanteric region fracture on the right. Components appear well positioned. No radiographically detectable complication. IMPRESSION: ORIF right trochanteric region fracture. Electronically Signed   By: Nelson Chimes M.D.   On: 05/04/2015 15:45   Dg Hip Unilat With Pelvis 2-3 Views Right  05/03/2015  CLINICAL DATA:  Fall today with right hip pain.  Initial encounter. EXAM: DG HIP (WITH OR WITHOUT PELVIS) 2-3V RIGHT COMPARISON:  None. FINDINGS: Acute fracture of the proximal right femur present through the intratrochanteric region. This fracture shows mild displacement. No dislocation. No other fractures are identified. No bony lesions are seen. Vascular calcifications are seen in the femoral arteries bilaterally. IMPRESSION: Acute intratrochanteric fracture of the right hip with mild displacement. Electronically Signed   By: Aletta Edouard M.D.   On: 05/03/2015 18:07  Today   Subjective:   Shaka Taney  is more awake alert feeling well denies any  complaint Objective:   Blood pressure 146/61, pulse 76, temperature 97.7 F (36.5 C), temperature source Oral, resp. rate 18, height 5\' 3"  (1.6 m), weight 47.174 kg (104 lb), SpO2 99 %.  .  Intake/Output Summary (Last 24 hours) at 05/08/15 1049 Last data filed at 05/08/15 0900  Gross per 24 hour  Intake    840 ml  Output    150 ml  Net    690 ml    Exam VITAL SIGNS: Blood pressure 146/61, pulse 76, temperature 97.7 F (36.5 C), temperature source Oral, resp. rate 18, height 5\' 3"  (1.6 m), weight 47.174 kg (104 lb), SpO2 99 %.  GENERAL:  82 y.o.-year-old patient lying in the bed with no acute distress.  EYES: Pupils equal, round, reactive to light and accommodation. No scleral icterus. Extraocular muscles intact.  HEENT: Head atraumatic, normocephalic. Oropharynx and nasopharynx clear.  NECK:  Supple, no jugular venous distention. No thyroid enlargement, no tenderness.  LUNGS: Normal breath sounds bilaterally, no wheezing, rales,rhonchi or crepitation. No use of accessory muscles of respiration.  CARDIOVASCULAR: S1, S2 normal. No murmurs, rubs, or gallops.  ABDOMEN: Soft, nontender, nondistended. Bowel sounds present. No organomegaly or mass.  EXTREMITIES: No pedal edema, cyanosis, or clubbing.  NEUROLOGIC: Cranial nerves II through XII are intact. Muscle strength 5/5 in all extremities. Sensation intact. Gait not checked.  PSYCHIATRIC: The patient is alert and oriented x 3.  SKIN: No obvious rash, lesion, or ulcer.   Data Review     CBC w Diff: Lab Results  Component Value Date   WBC 13.7* 05/07/2015   WBC 7.3 09/26/2011   HGB 8.3* 05/07/2015   HGB 12.3 09/26/2011   HCT 24.7* 05/07/2015   HCT 37.0 09/26/2011   PLT 253 05/07/2015   PLT 238 09/26/2011   LYMPHOPCT 13 05/07/2015   LYMPHOPCT 21.5 07/31/2011   MONOPCT 12 05/07/2015   MONOPCT 7.8 07/31/2011   EOSPCT 2 05/07/2015   EOSPCT 0.7 07/31/2011   BASOPCT 1 05/07/2015   BASOPCT 0.6 07/31/2011   CMP: Lab Results   Component Value Date   NA 132* 05/07/2015   NA 131* 03/24/2012   K 3.4* 05/07/2015   K 4.2 03/24/2012   CL 98* 05/07/2015   CL 97* 03/24/2012   CO2 31 05/07/2015   CO2 26 03/24/2012   BUN 22* 05/07/2015   BUN 30* 03/24/2012   CREATININE 0.75 05/07/2015   CREATININE 1.05 03/24/2012   PROT 7.6 01/29/2015   PROT 7.4 09/26/2011   ALBUMIN 3.4* 01/29/2015   ALBUMIN 3.8 09/26/2011   BILITOT 0.6 01/29/2015   BILITOT 0.4 09/26/2011   ALKPHOS 62 01/29/2015   ALKPHOS 87 09/26/2011   AST 23 01/29/2015   AST 28 09/26/2011   ALT 20 01/29/2015   ALT 22 09/26/2011  .  Micro Results Recent Results (from the past 240 hour(s))  Surgical pcr screen     Status: None   Collection Time: 05/03/15  8:46 PM  Result Value Ref Range Status   MRSA, PCR NEGATIVE NEGATIVE Final   Staphylococcus aureus NEGATIVE NEGATIVE Final    Comment:        The Xpert SA Assay (FDA approved for NASAL specimens in patients over 6 years of age), is one component of a comprehensive surveillance program.  Test performance has been validated by [facility] for patients greater than or equal to 47 year old. It is  not intended to diagnose infection nor to guide or monitor treatment.         Code Status Orders        Start     Ordered   05/03/15 2035  Do not attempt resuscitation (DNR)   Continuous    Question Answer Comment  In the event of cardiac or respiratory ARREST Do not call a "code blue"   In the event of cardiac or respiratory ARREST Do not perform Intubation, CPR, defibrillation or ACLS   In the event of cardiac or respiratory ARREST Use medication by any route, position, wound care, and other measures to relive pain and suffering. May use oxygen, suction and manual treatment of airway obstruction as needed for comfort.      05/03/15 2034    Code Status History    Date Active Date Inactive Code Status Order ID Comments User Context   11/20/2014 11:20 PM 11/23/2014  3:53 PM DNR [id number]   Lytle Butte, MD ED   11/20/2014 10:44 PM 11/20/2014 11:20 PM Full Code [id number]  Lytle Butte, MD ED   08/02/2014 10:28 AM 08/03/2014  8:37 PM Full Code [id number]  Demetrios Loll, MD ED    Advance Directive Documentation        Most Recent Value   Type of Advance Directive  Healthcare Power of [place], Living will, Out of facility DNR (pink MOST or yellow form)   Pre-existing out of facility DNR order (yellow form or pink MOST form)  Yellow form placed in chart (order not valid for inpatient use)   "MOST" Form in Place?            Follow-up Information    Follow up with [facility] .   Specialty:  [place] information:   [address] [place] [phone]      Follow up with Corky Mull, MD In 7 days.   Specialty:  Surgery   Contact information:   [place] [address] [phone]       Discharge Medications     Medication List    TAKE these medications        acetaminophen 325 MG tablet  Commonly known as:  TYLENOL  Take 650 mg by mouth 3 (three) times daily.     amLODipine 5 MG tablet  Commonly known as:  NORVASC  Take 5 mg by mouth daily.     bisacodyl 10 MG suppository  Commonly known as:  DULCOLAX  Place 1 suppository (10 mg total) rectally daily as needed for moderate constipation.     CALCIUM 600+D 600-400 MG-UNIT tablet  Generic drug:  Calcium Carbonate-Vitamin D  Take 1 tablet by mouth 2 (two) times daily.     docusate sodium 100 MG capsule  Commonly known as:  COLACE  Take 100 mg by mouth at bedtime.     docusate sodium 100 MG capsule  Commonly known as:  COLACE  Take 1 capsule (100 mg total) by mouth 2 (two) times daily.     donepezil 5 MG tablet  Commonly known as:  ARICEPT  Take 5 mg by mouth at bedtime.     ENDIT EX  Apply 1 application topically as needed (for redness/irritation).     enoxaparin 40 MG/0.4ML injection   Commonly known as:  LOVENOX  Inject 0.4 mLs (40 mg total) into the skin daily.     feeding supplement (GLUCERNA  SHAKE) Liqd  Take 237 mLs by mouth 2 (two) times daily between meals.     glucagon 1 MG Solr injection  Commonly known as:  GLUCAGEN  Inject 1 mg into the muscle once as needed for low blood sugar.     guaifenesin 100 MG/5ML syrup  Commonly known as:  ROBITUSSIN  Take 200 mg by mouth every 4 (four) hours as needed for cough.     insulin detemir 100 UNIT/ML injection  Commonly known as:  LEVEMIR  Inject 0.03 mLs (3 Units total) into the skin 2 (two) times daily. Pt uses 10 units in the morning and 4 units at bedtime.     insulin lispro 100 UNIT/ML injection  Commonly known as:  HUMALOG  Inject 1-5 Units into the skin 3 (three) times daily with meals as needed for high blood sugar. Pt uses as needed per sliding scale:    200-249:  1 unit  250-299:  2 units  300-349:  3 units  350-399:  4 units  Greater than 400:  5 units     levofloxacin 250 MG tablet  Commonly known as:  LEVAQUIN  Take 1 tablet (250 mg total) by mouth daily.     levothyroxine 137 MCG tablet  Commonly known as:  SYNTHROID, LEVOTHROID  Take 137 mcg by mouth daily before breakfast.     lidocaine 5 %  Commonly known as:  LIDODERM  Place 1 patch onto the skin daily.     LORazepam 1 MG tablet  Commonly known as:  ATIVAN  Take 1 tablet (1 mg total) by mouth at bedtime.     meclizine 25 MG tablet  Commonly known as:  ANTIVERT  Take 25 mg by mouth 3 (three) times daily as needed for nausea.     meloxicam 15 MG tablet  Commonly known as:  MOBIC  Take 15 mg by mouth at bedtime.     metFORMIN 1000 MG tablet  Commonly known as:  GLUCOPHAGE  Take 1,000 mg by mouth 2 (two) times daily with a meal.     metoprolol 50 MG tablet  Commonly known as:  LOPRESSOR  Take 1 tablet (50 mg total) by mouth 2 (two) times daily.     multivitamin with minerals Tabs tablet  Take 1 tablet by mouth daily.      omeprazole 20 MG capsule  Commonly known as:  PRILOSEC  Take 20 mg by mouth daily.     oxyCODONE 5 MG immediate release tablet  Commonly known as:  Oxy IR/ROXICODONE  Take 1-2 tablets (5-10 mg total) by mouth every 3 (three) hours as needed for breakthrough pain.     polyethylene glycol packet  Commonly known as:  MIRALAX / GLYCOLAX  Take 17 g by mouth every other day.     predniSONE 5 MG tablet  Commonly known as:  DELTASONE  Take 7.5 mg by mouth daily with breakfast.     sotalol 80 MG tablet  Commonly known as:  BETAPACE  Take 0.5 tablets (40 mg total) by mouth every 12 (twelve) hours.     Vitamin D3 5000 units Tabs  Take 5,000 Units by mouth at bedtime.     VITRON-C 65-125 MG Tabs  Generic drug:  Iron-Vitamin C  Take 1 tablet by mouth daily at 12 noon.           Total Time in preparing paper work, data evaluation and todays exam - 75 minutes  Dustin Flock M.D on 05/08/2015 at 10:49  AM  [facility]   Office  [phone]

## 2015-05-09 LAB — GLUCOSE, CAPILLARY
GLUCOSE-CAPILLARY: 286 mg/dL — AB (ref 65–99)
GLUCOSE-CAPILLARY: 256 mg/dL — AB (ref 65–99)
Glucose-Capillary: 281 mg/dL — ABNORMAL HIGH (ref 65–99)
Glucose-Capillary: 331 mg/dL — ABNORMAL HIGH (ref 65–99)
Glucose-Capillary: 228 mg/dL — ABNORMAL HIGH (ref 65–99)

## 2015-05-11 LAB — GLUCOSE, CAPILLARY
GLUCOSE-CAPILLARY: 192 mg/dL — AB (ref 65–99)
GLUCOSE-CAPILLARY: 249 mg/dL — AB (ref 65–99)
GLUCOSE-CAPILLARY: 145 mg/dL — AB (ref 65–99)
GLUCOSE-CAPILLARY: 234 mg/dL — AB (ref 65–99)
Glucose-Capillary: 153 mg/dL — ABNORMAL HIGH (ref 65–99)
Glucose-Capillary: 156 mg/dL — ABNORMAL HIGH (ref 65–99)
Glucose-Capillary: 178 mg/dL — ABNORMAL HIGH (ref 65–99)
Glucose-Capillary: 166 mg/dL — ABNORMAL HIGH (ref 65–99)

## 2015-05-12 LAB — GLUCOSE, CAPILLARY
GLUCOSE-CAPILLARY: 119 mg/dL — AB (ref 65–99)
GLUCOSE-CAPILLARY: 141 mg/dL — AB (ref 65–99)
Glucose-Capillary: 208 mg/dL — ABNORMAL HIGH (ref 65–99)
Glucose-Capillary: 110 mg/dL — ABNORMAL HIGH (ref 65–99)

## 2015-05-13 LAB — URINALYSIS COMPLETE WITH MICROSCOPIC (ARMC ONLY)
BILIRUBIN URINE: NEGATIVE
Glucose, UA: NEGATIVE mg/dL
Hgb urine dipstick: NEGATIVE
KETONES UR: NEGATIVE mg/dL
Leukocytes, UA: NEGATIVE
Nitrite: NEGATIVE
PH: 7 (ref 5.0–8.0)
PROTEIN: NEGATIVE mg/dL
SQUAMOUS EPITHELIAL / LPF: NONE SEEN
Specific Gravity, Urine: 1.01 (ref 1.005–1.030)

## 2015-05-13 LAB — COMPREHENSIVE METABOLIC PANEL
ALK PHOS: 69 U/L (ref 38–126)
ALT: 14 U/L (ref 14–54)
AST: 21 U/L (ref 15–41)
Albumin: 3.1 g/dL — ABNORMAL LOW (ref 3.5–5.0)
Anion gap: 11 (ref 5–15)
BUN: 30 mg/dL — AB (ref 6–20)
CALCIUM: 9.1 mg/dL (ref 8.9–10.3)
CHLORIDE: 91 mmol/L — AB (ref 101–111)
CO2: 28 mmol/L (ref 22–32)
CREATININE: 0.63 mg/dL (ref 0.44–1.00)
GFR calc Af Amer: >60 mL/min (ref >60)
GFR calc non Af Amer: >60 mL/min (ref >60)
GLUCOSE: 97 mg/dL (ref 65–99)
Potassium: 4.7 mmol/L (ref 3.5–5.1)
SODIUM: 130 mmol/L — AB (ref 135–145)
Total Bilirubin: 0.9 mg/dL (ref 0.3–1.2)
Total Protein: 6.8 g/dL (ref 6.5–8.1)

## 2015-05-13 LAB — CBC WITH DIFFERENTIAL/PLATELET
BASOS ABS: 0 10*3/uL (ref 0–0.1)
Basophils Relative: 0 %
EOS ABS: 0 10*3/uL (ref 0–0.7)
EOS PCT: 0 %
HCT: 29.5 % — ABNORMAL LOW (ref 35.0–47.0)
HEMOGLOBIN: 10.1 g/dL — AB (ref 12.0–16.0)
LYMPHS ABS: 1.1 10*3/uL (ref 1.0–3.6)
LYMPHS PCT: 7 %
MCH: 29.3 pg (ref 26.0–34.0)
MCHC: 34.3 g/dL (ref 32.0–36.0)
MCV: 85.3 fL (ref 80.0–100.0)
Monocytes Absolute: 0.6 10*3/uL (ref 0.2–0.9)
Monocytes Relative: 4 %
NEUTROS PCT: 89 %
Neutro Abs: 14.2 10*3/uL — ABNORMAL HIGH (ref 1.4–6.5)
PLATELETS: 499 10*3/uL — AB (ref 150–440)
RBC: 3.46 MIL/uL — AB (ref 3.80–5.20)
RDW: 13.9 % (ref 11.5–14.5)
WBC: 16 10*3/uL — AB (ref 3.6–11.0)

## 2015-05-13 LAB — GLUCOSE, CAPILLARY
GLUCOSE-CAPILLARY: 124 mg/dL — AB (ref 65–99)
GLUCOSE-CAPILLARY: 102 mg/dL — AB (ref 65–99)
Glucose-Capillary: 151 mg/dL — ABNORMAL HIGH (ref 65–99)
Glucose-Capillary: 156 mg/dL — ABNORMAL HIGH (ref 65–99)

## 2015-05-14 LAB — GLUCOSE, CAPILLARY
GLUCOSE-CAPILLARY: 159 mg/dL — AB (ref 65–99)
GLUCOSE-CAPILLARY: 235 mg/dL — AB (ref 65–99)
Glucose-Capillary: 222 mg/dL — ABNORMAL HIGH (ref 65–99)

## 2015-05-15 LAB — URINE CULTURE: CULTURE: NO GROWTH

## 2015-05-15 LAB — GLUCOSE, CAPILLARY
GLUCOSE-CAPILLARY: 152 mg/dL — AB (ref 65–99)
GLUCOSE-CAPILLARY: 254 mg/dL — AB (ref 65–99)
GLUCOSE-CAPILLARY: 128 mg/dL — AB (ref 65–99)

## 2015-05-16 LAB — CBC WITH DIFFERENTIAL/PLATELET
BASOS PCT: 0 %
Basophils Absolute: 0 10*3/uL (ref 0–0.1)
Eosinophils Absolute: 0.1 10*3/uL (ref 0–0.7)
Eosinophils Relative: 1 %
HEMATOCRIT: 32 % — AB (ref 35.0–47.0)
HEMOGLOBIN: 10.3 g/dL — AB (ref 12.0–16.0)
LYMPHS ABS: 2.8 10*3/uL (ref 1.0–3.6)
Lymphocytes Relative: 16 %
MCH: 27.4 pg (ref 26.0–34.0)
MCHC: 32.3 g/dL (ref 32.0–36.0)
MCV: 84.9 fL (ref 80.0–100.0)
MONOS PCT: 8 %
Monocytes Absolute: 1.4 10*3/uL — ABNORMAL HIGH (ref 0.2–0.9)
NEUTROS ABS: 13.5 10*3/uL — AB (ref 1.4–6.5)
NEUTROS PCT: 75 %
Platelets: 625 10*3/uL — ABNORMAL HIGH (ref 150–440)
RBC: 3.77 MIL/uL — ABNORMAL LOW (ref 3.80–5.20)
RDW: 14.5 % (ref 11.5–14.5)
WBC: 17.9 10*3/uL — ABNORMAL HIGH (ref 3.6–11.0)

## 2015-05-16 LAB — BASIC METABOLIC PANEL
Anion gap: 10 (ref 5–15)
BUN: 38 mg/dL — ABNORMAL HIGH (ref 6–20)
CHLORIDE: 92 mmol/L — AB (ref 101–111)
CO2: 30 mmol/L (ref 22–32)
CREATININE: 0.7 mg/dL (ref 0.44–1.00)
Calcium: 9.8 mg/dL (ref 8.9–10.3)
GFR calc non Af Amer: >60 mL/min (ref >60)
Glucose, Bld: 118 mg/dL — ABNORMAL HIGH (ref 65–99)
Potassium: 4.3 mmol/L (ref 3.5–5.1)
Sodium: 132 mmol/L — ABNORMAL LOW (ref 135–145)

## 2015-05-16 LAB — GLUCOSE, CAPILLARY
GLUCOSE-CAPILLARY: 214 mg/dL — AB (ref 65–99)
GLUCOSE-CAPILLARY: 117 mg/dL — AB (ref 65–99)
Glucose-Capillary: 82 mg/dL (ref 65–99)

## 2015-05-17 LAB — GLUCOSE, CAPILLARY
GLUCOSE-CAPILLARY: 111 mg/dL — AB (ref 65–99)
GLUCOSE-CAPILLARY: 95 mg/dL (ref 65–99)
GLUCOSE-CAPILLARY: 126 mg/dL — AB (ref 65–99)
GLUCOSE-CAPILLARY: 100 mg/dL — AB (ref 65–99)
Glucose-Capillary: 161 mg/dL — ABNORMAL HIGH (ref 65–99)

## 2015-05-18 LAB — CBC WITH DIFFERENTIAL/PLATELET
BASOS ABS: 0 10*3/uL (ref 0–0.1)
BASOS PCT: 0 %
EOS ABS: 0 10*3/uL (ref 0–0.7)
Eosinophils Relative: 0 %
HCT: 28 % — ABNORMAL LOW (ref 35.0–47.0)
HEMOGLOBIN: 9.3 g/dL — AB (ref 12.0–16.0)
Lymphocytes Relative: 15 %
Lymphs Abs: 1.9 10*3/uL (ref 1.0–3.6)
MCH: 28.1 pg (ref 26.0–34.0)
MCHC: 33.2 g/dL (ref 32.0–36.0)
MCV: 84.5 fL (ref 80.0–100.0)
Monocytes Absolute: 0.7 10*3/uL (ref 0.2–0.9)
Monocytes Relative: 5 %
NEUTROS ABS: 10 10*3/uL — AB (ref 1.4–6.5)
NEUTROS PCT: 80 %
Platelets: 509 10*3/uL — ABNORMAL HIGH (ref 150–440)
RBC: 3.31 MIL/uL — AB (ref 3.80–5.20)
RDW: 13.9 % (ref 11.5–14.5)
WBC: 12.7 10*3/uL — AB (ref 3.6–11.0)

## 2015-05-18 LAB — BASIC METABOLIC PANEL
Anion gap: 5 (ref 5–15)
BUN: 34 mg/dL — ABNORMAL HIGH (ref 6–20)
CHLORIDE: 93 mmol/L — AB (ref 101–111)
CO2: 32 mmol/L (ref 22–32)
CREATININE: 0.64 mg/dL (ref 0.44–1.00)
Calcium: 9.3 mg/dL (ref 8.9–10.3)
GFR calc non Af Amer: >60 mL/min (ref >60)
Glucose, Bld: 67 mg/dL (ref 65–99)
POTASSIUM: 4.8 mmol/L (ref 3.5–5.1)
SODIUM: 130 mmol/L — AB (ref 135–145)

## 2015-05-18 LAB — GLUCOSE, CAPILLARY
GLUCOSE-CAPILLARY: 58 mg/dL — AB (ref 65–99)
GLUCOSE-CAPILLARY: 212 mg/dL — AB (ref 65–99)
GLUCOSE-CAPILLARY: 202 mg/dL — AB (ref 65–99)
Glucose-Capillary: 145 mg/dL — ABNORMAL HIGH (ref 65–99)

## 2015-05-19 LAB — URINALYSIS COMPLETE WITH MICROSCOPIC (ARMC ONLY)
Bacteria, UA: NONE SEEN
Bilirubin Urine: NEGATIVE
GLUCOSE, UA: NEGATIVE mg/dL
Hgb urine dipstick: NEGATIVE
Ketones, ur: NEGATIVE mg/dL
Leukocytes, UA: NEGATIVE
Nitrite: NEGATIVE
Protein, ur: NEGATIVE mg/dL
SPECIFIC GRAVITY, URINE: 1.009 (ref 1.005–1.030)
Squamous Epithelial / LPF: NONE SEEN
pH: 8 (ref 5.0–8.0)

## 2015-05-20 LAB — GLUCOSE, CAPILLARY
GLUCOSE-CAPILLARY: 192 mg/dL — AB (ref 65–99)
GLUCOSE-CAPILLARY: 244 mg/dL — AB (ref 65–99)
GLUCOSE-CAPILLARY: 191 mg/dL — AB (ref 65–99)
Glucose-Capillary: 130 mg/dL — ABNORMAL HIGH (ref 65–99)
Glucose-Capillary: 207 mg/dL — ABNORMAL HIGH (ref 65–99)
Glucose-Capillary: 117 mg/dL — ABNORMAL HIGH (ref 65–99)
Glucose-Capillary: 93 mg/dL (ref 65–99)
Glucose-Capillary: 199 mg/dL — ABNORMAL HIGH (ref 65–99)

## 2015-05-21 LAB — URINE CULTURE

## 2015-05-21 LAB — GLUCOSE, CAPILLARY
GLUCOSE-CAPILLARY: 204 mg/dL — AB (ref 65–99)
GLUCOSE-CAPILLARY: 220 mg/dL — AB (ref 65–99)
Glucose-Capillary: 88 mg/dL (ref 65–99)
Glucose-Capillary: 170 mg/dL — ABNORMAL HIGH (ref 65–99)

## 2015-05-22 LAB — BASIC METABOLIC PANEL
Anion gap: 8 (ref 5–15)
BUN: 31 mg/dL — ABNORMAL HIGH (ref 6–20)
CALCIUM: 9.7 mg/dL (ref 8.9–10.3)
CO2: 33 mmol/L — AB (ref 22–32)
CREATININE: 0.57 mg/dL (ref 0.44–1.00)
Chloride: 91 mmol/L — ABNORMAL LOW (ref 101–111)
GFR calc Af Amer: >60 mL/min (ref >60)
GLUCOSE: 77 mg/dL (ref 65–99)
Potassium: 5.2 mmol/L — ABNORMAL HIGH (ref 3.5–5.1)
Sodium: 132 mmol/L — ABNORMAL LOW (ref 135–145)

## 2015-05-22 LAB — CBC WITH DIFFERENTIAL/PLATELET
BASOS PCT: 0 %
Basophils Absolute: 0.1 10*3/uL (ref 0–0.1)
EOS PCT: 1 %
Eosinophils Absolute: 0.1 10*3/uL (ref 0–0.7)
HEMATOCRIT: 32 % — AB (ref 35.0–47.0)
Hemoglobin: 10.5 g/dL — ABNORMAL LOW (ref 12.0–16.0)
LYMPHS PCT: 20 %
Lymphs Abs: 2.5 10*3/uL (ref 1.0–3.6)
MCH: 28.3 pg (ref 26.0–34.0)
MCHC: 32.7 g/dL (ref 32.0–36.0)
MCV: 86.6 fL (ref 80.0–100.0)
MONO ABS: 1.1 10*3/uL — AB (ref 0.2–0.9)
MONOS PCT: 8 %
NEUTROS ABS: 8.8 10*3/uL — AB (ref 1.4–6.5)
Neutrophils Relative %: 71 %
PLATELETS: 522 10*3/uL — AB (ref 150–440)
RBC: 3.69 MIL/uL — ABNORMAL LOW (ref 3.80–5.20)
RDW: 14.2 % (ref 11.5–14.5)
WBC: 12.5 10*3/uL — ABNORMAL HIGH (ref 3.6–11.0)

## 2015-05-22 LAB — GLUCOSE, CAPILLARY
Glucose-Capillary: 93 mg/dL (ref 65–99)
Glucose-Capillary: 161 mg/dL — ABNORMAL HIGH (ref 65–99)
Glucose-Capillary: 187 mg/dL — ABNORMAL HIGH (ref 65–99)
Glucose-Capillary: 217 mg/dL — ABNORMAL HIGH (ref 65–99)

## 2015-05-23 LAB — BASIC METABOLIC PANEL
Anion gap: 9 (ref 5–15)
BUN: 29 mg/dL — AB (ref 6–20)
CALCIUM: 9.6 mg/dL (ref 8.9–10.3)
CHLORIDE: 91 mmol/L — AB (ref 101–111)
CO2: 30 mmol/L (ref 22–32)
CREATININE: 0.62 mg/dL (ref 0.44–1.00)
GFR calc Af Amer: >60 mL/min (ref >60)
GFR calc non Af Amer: >60 mL/min (ref >60)
GLUCOSE: 106 mg/dL — AB (ref 65–99)
Potassium: 4.6 mmol/L (ref 3.5–5.1)
Sodium: 130 mmol/L — ABNORMAL LOW (ref 135–145)

## 2015-05-23 LAB — GLUCOSE, CAPILLARY: Glucose-Capillary: 222 mg/dL — ABNORMAL HIGH (ref 65–99)

## 2015-05-24 LAB — GLUCOSE, CAPILLARY
GLUCOSE-CAPILLARY: 129 mg/dL — AB (ref 65–99)
GLUCOSE-CAPILLARY: 215 mg/dL — AB (ref 65–99)
Glucose-Capillary: 169 mg/dL — ABNORMAL HIGH (ref 65–99)
Glucose-Capillary: 99 mg/dL (ref 65–99)
Glucose-Capillary: 142 mg/dL — ABNORMAL HIGH (ref 65–99)
Glucose-Capillary: 163 mg/dL — ABNORMAL HIGH (ref 65–99)

## 2015-05-25 LAB — GLUCOSE, CAPILLARY
GLUCOSE-CAPILLARY: 240 mg/dL — AB (ref 65–99)
GLUCOSE-CAPILLARY: 183 mg/dL — AB (ref 65–99)
GLUCOSE-CAPILLARY: 177 mg/dL — AB (ref 65–99)
Glucose-Capillary: 82 mg/dL (ref 65–99)

## 2015-05-26 LAB — GLUCOSE, CAPILLARY
GLUCOSE-CAPILLARY: 68 mg/dL (ref 65–99)
GLUCOSE-CAPILLARY: 167 mg/dL — AB (ref 65–99)
Glucose-Capillary: 160 mg/dL — ABNORMAL HIGH (ref 65–99)
Glucose-Capillary: 173 mg/dL — ABNORMAL HIGH (ref 65–99)

## 2015-05-27 LAB — GLUCOSE, CAPILLARY
GLUCOSE-CAPILLARY: 97 mg/dL (ref 65–99)
GLUCOSE-CAPILLARY: 123 mg/dL — AB (ref 65–99)
GLUCOSE-CAPILLARY: 58 mg/dL — AB (ref 65–99)
Glucose-Capillary: 142 mg/dL — ABNORMAL HIGH (ref 65–99)

## 2015-05-28 LAB — GLUCOSE, CAPILLARY
GLUCOSE-CAPILLARY: 105 mg/dL — AB (ref 65–99)
GLUCOSE-CAPILLARY: 111 mg/dL — AB (ref 65–99)
GLUCOSE-CAPILLARY: 121 mg/dL — AB (ref 65–99)
GLUCOSE-CAPILLARY: 64 mg/dL — AB (ref 65–99)
GLUCOSE-CAPILLARY: 119 mg/dL — AB (ref 65–99)

## 2015-05-29 ENCOUNTER — Encounter
Admission: RE | Admit: 2015-05-29 | Discharge: 2015-05-29 | Disposition: A | Discharge status: Home / Self Care | Payer: Medicare Other | Source: Ambulatory Visit | Attending: Internal Medicine | Admitting: Internal Medicine

## 2015-05-29 DIAGNOSIS — E119 Type 2 diabetes mellitus without complications: Secondary | ICD-10-CM | POA: Insufficient documentation

## 2015-05-29 LAB — GLUCOSE, CAPILLARY: Glucose-Capillary: 78 mg/dL (ref 65–99)

## 2015-05-30 DIAGNOSIS — E119 Type 2 diabetes mellitus without complications: Secondary | ICD-10-CM | POA: Diagnosis present

## 2015-05-30 LAB — BASIC METABOLIC PANEL
Anion gap: 8 (ref 5–15)
BUN: 32 mg/dL — AB (ref 6–20)
CHLORIDE: 95 mmol/L — AB (ref 101–111)
CO2: 31 mmol/L (ref 22–32)
CREATININE: 0.58 mg/dL (ref 0.44–1.00)
Calcium: 9.5 mg/dL (ref 8.9–10.3)
Glucose, Bld: 34 mg/dL — CL (ref 65–99)
Potassium: 3.7 mmol/L (ref 3.5–5.1)
SODIUM: 134 mmol/L — AB (ref 135–145)

## 2015-05-30 LAB — GLUCOSE, CAPILLARY
GLUCOSE-CAPILLARY: 176 mg/dL — AB (ref 65–99)
GLUCOSE-CAPILLARY: 170 mg/dL — AB (ref 65–99)
GLUCOSE-CAPILLARY: 49 mg/dL — AB (ref 65–99)
GLUCOSE-CAPILLARY: 137 mg/dL — AB (ref 65–99)
Glucose-Capillary: 140 mg/dL — ABNORMAL HIGH (ref 65–99)
Glucose-Capillary: 131 mg/dL — ABNORMAL HIGH (ref 65–99)

## 2015-05-31 LAB — GLUCOSE, CAPILLARY
GLUCOSE-CAPILLARY: 165 mg/dL — AB (ref 65–99)
GLUCOSE-CAPILLARY: 137 mg/dL — AB (ref 65–99)
GLUCOSE-CAPILLARY: 86 mg/dL (ref 65–99)
GLUCOSE-CAPILLARY: 121 mg/dL — AB (ref 65–99)
Glucose-Capillary: 30 mg/dL — CL (ref 65–99)
Glucose-Capillary: 105 mg/dL — ABNORMAL HIGH (ref 65–99)
Glucose-Capillary: 58 mg/dL — ABNORMAL LOW (ref 65–99)
Glucose-Capillary: 117 mg/dL — ABNORMAL HIGH (ref 65–99)
Glucose-Capillary: 113 mg/dL — ABNORMAL HIGH (ref 65–99)
Glucose-Capillary: 128 mg/dL — ABNORMAL HIGH (ref 65–99)

## 2015-06-01 DIAGNOSIS — E119 Type 2 diabetes mellitus without complications: Secondary | ICD-10-CM | POA: Diagnosis not present

## 2015-06-01 LAB — GLUCOSE, CAPILLARY
GLUCOSE-CAPILLARY: 70 mg/dL (ref 65–99)
GLUCOSE-CAPILLARY: 137 mg/dL — AB (ref 65–99)
GLUCOSE-CAPILLARY: 114 mg/dL — AB (ref 65–99)
Glucose-Capillary: 115 mg/dL — ABNORMAL HIGH (ref 65–99)

## 2015-06-02 DIAGNOSIS — E119 Type 2 diabetes mellitus without complications: Secondary | ICD-10-CM | POA: Diagnosis not present

## 2015-06-02 LAB — BASIC METABOLIC PANEL
Anion gap: 8 (ref 5–15)
BUN: 26 mg/dL — ABNORMAL HIGH (ref 6–20)
CALCIUM: 9.6 mg/dL (ref 8.9–10.3)
CO2: 32 mmol/L (ref 22–32)
CREATININE: 0.67 mg/dL (ref 0.44–1.00)
Chloride: 94 mmol/L — ABNORMAL LOW (ref 101–111)
GFR calc Af Amer: >60 mL/min (ref >60)
GFR calc non Af Amer: >60 mL/min (ref >60)
GLUCOSE: 62 mg/dL — AB (ref 65–99)
Potassium: 4.4 mmol/L (ref 3.5–5.1)
Sodium: 134 mmol/L — ABNORMAL LOW (ref 135–145)

## 2015-06-02 LAB — GLUCOSE, CAPILLARY
GLUCOSE-CAPILLARY: 144 mg/dL — AB (ref 65–99)
Glucose-Capillary: 80 mg/dL (ref 65–99)
Glucose-Capillary: 108 mg/dL — ABNORMAL HIGH (ref 65–99)
Glucose-Capillary: 152 mg/dL — ABNORMAL HIGH (ref 65–99)

## 2015-06-03 DIAGNOSIS — E119 Type 2 diabetes mellitus without complications: Secondary | ICD-10-CM | POA: Diagnosis not present

## 2015-06-03 LAB — GLUCOSE, CAPILLARY
GLUCOSE-CAPILLARY: 121 mg/dL — AB (ref 65–99)
Glucose-Capillary: 93 mg/dL (ref 65–99)
Glucose-Capillary: 132 mg/dL — ABNORMAL HIGH (ref 65–99)
Glucose-Capillary: 125 mg/dL — ABNORMAL HIGH (ref 65–99)

## 2015-06-04 DIAGNOSIS — E119 Type 2 diabetes mellitus without complications: Secondary | ICD-10-CM | POA: Diagnosis not present

## 2015-06-04 LAB — GLUCOSE, CAPILLARY
GLUCOSE-CAPILLARY: 107 mg/dL — AB (ref 65–99)
GLUCOSE-CAPILLARY: 148 mg/dL — AB (ref 65–99)
Glucose-Capillary: 142 mg/dL — ABNORMAL HIGH (ref 65–99)
Glucose-Capillary: 131 mg/dL — ABNORMAL HIGH (ref 65–99)

## 2015-06-05 DIAGNOSIS — E119 Type 2 diabetes mellitus without complications: Secondary | ICD-10-CM | POA: Diagnosis not present

## 2015-06-05 LAB — GLUCOSE, CAPILLARY
Glucose-Capillary: 68 mg/dL (ref 65–99)
Glucose-Capillary: 167 mg/dL — ABNORMAL HIGH (ref 65–99)
Glucose-Capillary: 194 mg/dL — ABNORMAL HIGH (ref 65–99)
Glucose-Capillary: 105 mg/dL — ABNORMAL HIGH (ref 65–99)

## 2015-06-06 DIAGNOSIS — E119 Type 2 diabetes mellitus without complications: Secondary | ICD-10-CM | POA: Diagnosis not present

## 2015-06-06 LAB — BASIC METABOLIC PANEL
ANION GAP: 11 (ref 5–15)
BUN: 23 mg/dL — ABNORMAL HIGH (ref 6–20)
CO2: 28 mmol/L (ref 22–32)
Calcium: 9.8 mg/dL (ref 8.9–10.3)
Chloride: 93 mmol/L — ABNORMAL LOW (ref 101–111)
Creatinine, Ser: 0.57 mg/dL (ref 0.44–1.00)
GFR calc Af Amer: >60 mL/min (ref >60)
GLUCOSE: 92 mg/dL (ref 65–99)
POTASSIUM: 4.1 mmol/L (ref 3.5–5.1)
Sodium: 132 mmol/L — ABNORMAL LOW (ref 135–145)

## 2015-06-06 LAB — GLUCOSE, CAPILLARY
GLUCOSE-CAPILLARY: 74 mg/dL (ref 65–99)
GLUCOSE-CAPILLARY: 131 mg/dL — AB (ref 65–99)
Glucose-Capillary: 181 mg/dL — ABNORMAL HIGH (ref 65–99)
Glucose-Capillary: 156 mg/dL — ABNORMAL HIGH (ref 65–99)

## 2015-06-07 DIAGNOSIS — E119 Type 2 diabetes mellitus without complications: Secondary | ICD-10-CM | POA: Diagnosis not present

## 2015-06-07 LAB — GLUCOSE, CAPILLARY
GLUCOSE-CAPILLARY: 162 mg/dL — AB (ref 65–99)
GLUCOSE-CAPILLARY: 119 mg/dL — AB (ref 65–99)
GLUCOSE-CAPILLARY: 104 mg/dL — AB (ref 65–99)

## 2015-06-13 DIAGNOSIS — E119 Type 2 diabetes mellitus without complications: Secondary | ICD-10-CM | POA: Diagnosis not present

## 2015-06-13 LAB — BASIC METABOLIC PANEL
ANION GAP: 7 (ref 5–15)
BUN: 26 mg/dL — ABNORMAL HIGH (ref 6–20)
CALCIUM: 10.1 mg/dL (ref 8.9–10.3)
CO2: 33 mmol/L — ABNORMAL HIGH (ref 22–32)
Chloride: 94 mmol/L — ABNORMAL LOW (ref 101–111)
Creatinine, Ser: 0.66 mg/dL (ref 0.44–1.00)
GLUCOSE: 115 mg/dL — AB (ref 65–99)
POTASSIUM: 4.4 mmol/L (ref 3.5–5.1)
Sodium: 134 mmol/L — ABNORMAL LOW (ref 135–145)

## 2015-06-13 LAB — CBC WITH DIFFERENTIAL/PLATELET
BASOS ABS: 0 10*3/uL (ref 0–0.1)
Eosinophils Absolute: 0 10*3/uL (ref 0–0.7)
Eosinophils Relative: 0 %
HEMATOCRIT: 33.6 % — AB (ref 35.0–47.0)
Hemoglobin: 11.3 g/dL — ABNORMAL LOW (ref 12.0–16.0)
Lymphs Abs: 2.8 10*3/uL (ref 1.0–3.6)
MCH: 28.4 pg (ref 26.0–34.0)
MCHC: 33.6 g/dL (ref 32.0–36.0)
MCV: 84.6 fL (ref 80.0–100.0)
MONO ABS: 1 10*3/uL — AB (ref 0.2–0.9)
Monocytes Relative: 9 %
NEUTROS ABS: 6.4 10*3/uL (ref 1.4–6.5)
Platelets: 364 10*3/uL (ref 150–440)
RBC: 3.98 MIL/uL (ref 3.80–5.20)
RDW: 14.2 % (ref 11.5–14.5)
WBC: 10.4 10*3/uL (ref 3.6–11.0)

## 2015-06-27 DIAGNOSIS — E119 Type 2 diabetes mellitus without complications: Secondary | ICD-10-CM | POA: Diagnosis not present

## 2015-06-27 LAB — TSH: TSH: 0.715 u[IU]/mL (ref 0.350–4.500)

## 2015-06-29 ENCOUNTER — Encounter
Admission: RE | Admit: 2015-06-29 | Discharge: 2015-06-29 | Disposition: A | Discharge status: Home / Self Care | Source: Ambulatory Visit | Attending: Internal Medicine | Admitting: Internal Medicine

## 2015-06-29 DIAGNOSIS — E119 Type 2 diabetes mellitus without complications: Secondary | ICD-10-CM | POA: Diagnosis present

## 2015-06-29 DIAGNOSIS — R1030 Lower abdominal pain, unspecified: Secondary | ICD-10-CM | POA: Insufficient documentation

## 2015-06-29 DIAGNOSIS — R300 Dysuria: Secondary | ICD-10-CM | POA: Insufficient documentation

## 2015-06-29 LAB — GLUCOSE, CAPILLARY
GLUCOSE-CAPILLARY: 97 mg/dL (ref 65–99)
GLUCOSE-CAPILLARY: 155 mg/dL — AB (ref 65–99)

## 2015-06-30 DIAGNOSIS — E119 Type 2 diabetes mellitus without complications: Secondary | ICD-10-CM | POA: Diagnosis not present

## 2015-06-30 LAB — GLUCOSE, CAPILLARY
GLUCOSE-CAPILLARY: 90 mg/dL (ref 65–99)
GLUCOSE-CAPILLARY: 120 mg/dL — AB (ref 65–99)
Glucose-Capillary: 141 mg/dL — ABNORMAL HIGH (ref 65–99)
Glucose-Capillary: 228 mg/dL — ABNORMAL HIGH (ref 65–99)

## 2015-07-01 DIAGNOSIS — E119 Type 2 diabetes mellitus without complications: Secondary | ICD-10-CM | POA: Diagnosis not present

## 2015-07-01 LAB — GLUCOSE, CAPILLARY
GLUCOSE-CAPILLARY: 178 mg/dL — AB (ref 65–99)
GLUCOSE-CAPILLARY: 227 mg/dL — AB (ref 65–99)
Glucose-Capillary: 120 mg/dL — ABNORMAL HIGH (ref 65–99)
Glucose-Capillary: 167 mg/dL — ABNORMAL HIGH (ref 65–99)

## 2015-07-02 DIAGNOSIS — E119 Type 2 diabetes mellitus without complications: Secondary | ICD-10-CM | POA: Diagnosis not present

## 2015-07-02 LAB — GLUCOSE, CAPILLARY
GLUCOSE-CAPILLARY: 193 mg/dL — AB (ref 65–99)
GLUCOSE-CAPILLARY: 217 mg/dL — AB (ref 65–99)
Glucose-Capillary: 130 mg/dL — ABNORMAL HIGH (ref 65–99)
Glucose-Capillary: 187 mg/dL — ABNORMAL HIGH (ref 65–99)

## 2015-07-03 DIAGNOSIS — E119 Type 2 diabetes mellitus without complications: Secondary | ICD-10-CM | POA: Diagnosis not present

## 2015-07-03 LAB — GLUCOSE, CAPILLARY
GLUCOSE-CAPILLARY: 225 mg/dL — AB (ref 65–99)
Glucose-Capillary: 133 mg/dL — ABNORMAL HIGH (ref 65–99)
Glucose-Capillary: 274 mg/dL — ABNORMAL HIGH (ref 65–99)
Glucose-Capillary: 267 mg/dL — ABNORMAL HIGH (ref 65–99)

## 2015-07-04 DIAGNOSIS — E119 Type 2 diabetes mellitus without complications: Secondary | ICD-10-CM | POA: Diagnosis not present

## 2015-07-04 LAB — GLUCOSE, CAPILLARY
GLUCOSE-CAPILLARY: 274 mg/dL — AB (ref 65–99)
GLUCOSE-CAPILLARY: 254 mg/dL — AB (ref 65–99)
Glucose-Capillary: 145 mg/dL — ABNORMAL HIGH (ref 65–99)
Glucose-Capillary: 191 mg/dL — ABNORMAL HIGH (ref 65–99)

## 2015-07-05 DIAGNOSIS — E119 Type 2 diabetes mellitus without complications: Secondary | ICD-10-CM | POA: Diagnosis not present

## 2015-07-05 LAB — GLUCOSE, CAPILLARY
Glucose-Capillary: 124 mg/dL — ABNORMAL HIGH (ref 65–99)
Glucose-Capillary: 234 mg/dL — ABNORMAL HIGH (ref 65–99)
Glucose-Capillary: 225 mg/dL — ABNORMAL HIGH (ref 65–99)
Glucose-Capillary: 191 mg/dL — ABNORMAL HIGH (ref 65–99)

## 2015-07-06 DIAGNOSIS — E119 Type 2 diabetes mellitus without complications: Secondary | ICD-10-CM | POA: Diagnosis not present

## 2015-07-06 LAB — GLUCOSE, CAPILLARY
GLUCOSE-CAPILLARY: 166 mg/dL — AB (ref 65–99)
GLUCOSE-CAPILLARY: 132 mg/dL — AB (ref 65–99)
GLUCOSE-CAPILLARY: 234 mg/dL — AB (ref 65–99)
Glucose-Capillary: 216 mg/dL — ABNORMAL HIGH (ref 65–99)

## 2015-07-07 DIAGNOSIS — E119 Type 2 diabetes mellitus without complications: Secondary | ICD-10-CM | POA: Diagnosis not present

## 2015-07-07 LAB — GLUCOSE, CAPILLARY
GLUCOSE-CAPILLARY: 153 mg/dL — AB (ref 65–99)
GLUCOSE-CAPILLARY: 240 mg/dL — AB (ref 65–99)
Glucose-Capillary: 237 mg/dL — ABNORMAL HIGH (ref 65–99)

## 2015-07-08 DIAGNOSIS — E119 Type 2 diabetes mellitus without complications: Secondary | ICD-10-CM | POA: Diagnosis not present

## 2015-07-08 LAB — GLUCOSE, CAPILLARY
GLUCOSE-CAPILLARY: 165 mg/dL — AB (ref 65–99)
Glucose-Capillary: 222 mg/dL — ABNORMAL HIGH (ref 65–99)
Glucose-Capillary: 318 mg/dL — ABNORMAL HIGH (ref 65–99)
Glucose-Capillary: 328 mg/dL — ABNORMAL HIGH (ref 65–99)

## 2015-07-09 DIAGNOSIS — E119 Type 2 diabetes mellitus without complications: Secondary | ICD-10-CM | POA: Diagnosis not present

## 2015-07-09 LAB — GLUCOSE, CAPILLARY
GLUCOSE-CAPILLARY: 152 mg/dL — AB (ref 65–99)
GLUCOSE-CAPILLARY: 347 mg/dL — AB (ref 65–99)
GLUCOSE-CAPILLARY: 306 mg/dL — AB (ref 65–99)
Glucose-Capillary: 302 mg/dL — ABNORMAL HIGH (ref 65–99)

## 2015-07-10 DIAGNOSIS — E119 Type 2 diabetes mellitus without complications: Secondary | ICD-10-CM | POA: Diagnosis not present

## 2015-07-10 LAB — GLUCOSE, CAPILLARY
GLUCOSE-CAPILLARY: 138 mg/dL — AB (ref 65–99)
GLUCOSE-CAPILLARY: 230 mg/dL — AB (ref 65–99)
Glucose-Capillary: 281 mg/dL — ABNORMAL HIGH (ref 65–99)
Glucose-Capillary: 258 mg/dL — ABNORMAL HIGH (ref 65–99)

## 2015-07-11 DIAGNOSIS — E119 Type 2 diabetes mellitus without complications: Secondary | ICD-10-CM | POA: Diagnosis not present

## 2015-07-11 LAB — GLUCOSE, CAPILLARY
GLUCOSE-CAPILLARY: 351 mg/dL — AB (ref 65–99)
Glucose-Capillary: 176 mg/dL — ABNORMAL HIGH (ref 65–99)
Glucose-Capillary: 292 mg/dL — ABNORMAL HIGH (ref 65–99)
Glucose-Capillary: 179 mg/dL — ABNORMAL HIGH (ref 65–99)

## 2015-07-12 DIAGNOSIS — E119 Type 2 diabetes mellitus without complications: Secondary | ICD-10-CM | POA: Diagnosis not present

## 2015-07-12 LAB — GLUCOSE, CAPILLARY
GLUCOSE-CAPILLARY: 165 mg/dL — AB (ref 65–99)
Glucose-Capillary: 242 mg/dL — ABNORMAL HIGH (ref 65–99)
Glucose-Capillary: 275 mg/dL — ABNORMAL HIGH (ref 65–99)

## 2015-07-13 DIAGNOSIS — E119 Type 2 diabetes mellitus without complications: Secondary | ICD-10-CM | POA: Diagnosis not present

## 2015-07-13 LAB — GLUCOSE, CAPILLARY
GLUCOSE-CAPILLARY: 292 mg/dL — AB (ref 65–99)
GLUCOSE-CAPILLARY: 282 mg/dL — AB (ref 65–99)
Glucose-Capillary: 133 mg/dL — ABNORMAL HIGH (ref 65–99)
Glucose-Capillary: 311 mg/dL — ABNORMAL HIGH (ref 65–99)

## 2015-07-14 DIAGNOSIS — E119 Type 2 diabetes mellitus without complications: Secondary | ICD-10-CM | POA: Diagnosis not present

## 2015-07-14 LAB — GLUCOSE, CAPILLARY
GLUCOSE-CAPILLARY: 154 mg/dL — AB (ref 65–99)
GLUCOSE-CAPILLARY: 324 mg/dL — AB (ref 65–99)
Glucose-Capillary: 303 mg/dL — ABNORMAL HIGH (ref 65–99)
Glucose-Capillary: 314 mg/dL — ABNORMAL HIGH (ref 65–99)

## 2015-07-15 DIAGNOSIS — E119 Type 2 diabetes mellitus without complications: Secondary | ICD-10-CM | POA: Diagnosis not present

## 2015-07-15 LAB — GLUCOSE, CAPILLARY
GLUCOSE-CAPILLARY: 175 mg/dL — AB (ref 65–99)
GLUCOSE-CAPILLARY: 262 mg/dL — AB (ref 65–99)
GLUCOSE-CAPILLARY: 271 mg/dL — AB (ref 65–99)
Glucose-Capillary: 262 mg/dL — ABNORMAL HIGH (ref 65–99)

## 2015-07-16 DIAGNOSIS — E119 Type 2 diabetes mellitus without complications: Secondary | ICD-10-CM | POA: Diagnosis not present

## 2015-07-16 LAB — GLUCOSE, CAPILLARY
GLUCOSE-CAPILLARY: 137 mg/dL — AB (ref 65–99)
GLUCOSE-CAPILLARY: 316 mg/dL — AB (ref 65–99)
Glucose-Capillary: 353 mg/dL — ABNORMAL HIGH (ref 65–99)
Glucose-Capillary: 300 mg/dL — ABNORMAL HIGH (ref 65–99)

## 2015-07-17 DIAGNOSIS — E119 Type 2 diabetes mellitus without complications: Secondary | ICD-10-CM | POA: Diagnosis not present

## 2015-07-17 LAB — URINALYSIS COMPLETE WITH MICROSCOPIC (ARMC ONLY)
Bilirubin Urine: NEGATIVE
Glucose, UA: >500 mg/dL — AB
Ketones, ur: NEGATIVE mg/dL
NITRITE: NEGATIVE
PH: 5 (ref 5.0–8.0)
Protein, ur: 30 mg/dL — AB
SPECIFIC GRAVITY, URINE: 1.017 (ref 1.005–1.030)

## 2015-07-17 LAB — GLUCOSE, CAPILLARY
GLUCOSE-CAPILLARY: 352 mg/dL — AB (ref 65–99)
GLUCOSE-CAPILLARY: 281 mg/dL — AB (ref 65–99)
Glucose-Capillary: 217 mg/dL — ABNORMAL HIGH (ref 65–99)
Glucose-Capillary: 353 mg/dL — ABNORMAL HIGH (ref 65–99)

## 2015-07-18 DIAGNOSIS — E119 Type 2 diabetes mellitus without complications: Secondary | ICD-10-CM | POA: Diagnosis not present

## 2015-07-18 LAB — URINE CULTURE

## 2015-07-18 LAB — GLUCOSE, CAPILLARY
GLUCOSE-CAPILLARY: 186 mg/dL — AB (ref 65–99)
GLUCOSE-CAPILLARY: 270 mg/dL — AB (ref 65–99)
Glucose-Capillary: 364 mg/dL — ABNORMAL HIGH (ref 65–99)
Glucose-Capillary: 353 mg/dL — ABNORMAL HIGH (ref 65–99)

## 2015-07-19 DIAGNOSIS — E119 Type 2 diabetes mellitus without complications: Secondary | ICD-10-CM | POA: Diagnosis not present

## 2015-07-19 LAB — GLUCOSE, CAPILLARY
GLUCOSE-CAPILLARY: 162 mg/dL — AB (ref 65–99)
GLUCOSE-CAPILLARY: 322 mg/dL — AB (ref 65–99)
GLUCOSE-CAPILLARY: 207 mg/dL — AB (ref 65–99)
Glucose-Capillary: 284 mg/dL — ABNORMAL HIGH (ref 65–99)

## 2015-07-20 DIAGNOSIS — E119 Type 2 diabetes mellitus without complications: Secondary | ICD-10-CM | POA: Diagnosis not present

## 2015-07-20 LAB — GLUCOSE, CAPILLARY
GLUCOSE-CAPILLARY: 262 mg/dL — AB (ref 65–99)
GLUCOSE-CAPILLARY: 239 mg/dL — AB (ref 65–99)
Glucose-Capillary: 115 mg/dL — ABNORMAL HIGH (ref 65–99)
Glucose-Capillary: 244 mg/dL — ABNORMAL HIGH (ref 65–99)

## 2015-07-21 DIAGNOSIS — E119 Type 2 diabetes mellitus without complications: Secondary | ICD-10-CM | POA: Diagnosis not present

## 2015-07-21 LAB — GLUCOSE, CAPILLARY
GLUCOSE-CAPILLARY: 322 mg/dL — AB (ref 65–99)
GLUCOSE-CAPILLARY: 280 mg/dL — AB (ref 65–99)
Glucose-Capillary: 191 mg/dL — ABNORMAL HIGH (ref 65–99)
Glucose-Capillary: 216 mg/dL — ABNORMAL HIGH (ref 65–99)

## 2015-07-22 DIAGNOSIS — E119 Type 2 diabetes mellitus without complications: Secondary | ICD-10-CM | POA: Diagnosis not present

## 2015-07-22 LAB — GLUCOSE, CAPILLARY
GLUCOSE-CAPILLARY: 111 mg/dL — AB (ref 65–99)
Glucose-Capillary: 198 mg/dL — ABNORMAL HIGH (ref 65–99)
Glucose-Capillary: 288 mg/dL — ABNORMAL HIGH (ref 65–99)
Glucose-Capillary: 265 mg/dL — ABNORMAL HIGH (ref 65–99)

## 2015-07-23 DIAGNOSIS — E119 Type 2 diabetes mellitus without complications: Secondary | ICD-10-CM | POA: Diagnosis not present

## 2015-07-23 LAB — GLUCOSE, CAPILLARY
GLUCOSE-CAPILLARY: 119 mg/dL — AB (ref 65–99)
GLUCOSE-CAPILLARY: 226 mg/dL — AB (ref 65–99)
Glucose-Capillary: 274 mg/dL — ABNORMAL HIGH (ref 65–99)
Glucose-Capillary: 282 mg/dL — ABNORMAL HIGH (ref 65–99)

## 2015-07-24 DIAGNOSIS — E119 Type 2 diabetes mellitus without complications: Secondary | ICD-10-CM | POA: Diagnosis not present

## 2015-07-24 LAB — GLUCOSE, CAPILLARY
GLUCOSE-CAPILLARY: 247 mg/dL — AB (ref 65–99)
Glucose-Capillary: 84 mg/dL (ref 65–99)
Glucose-Capillary: 298 mg/dL — ABNORMAL HIGH (ref 65–99)
Glucose-Capillary: 379 mg/dL — ABNORMAL HIGH (ref 65–99)

## 2015-07-25 DIAGNOSIS — E119 Type 2 diabetes mellitus without complications: Secondary | ICD-10-CM | POA: Diagnosis not present

## 2015-07-25 LAB — URINALYSIS COMPLETE WITH MICROSCOPIC (ARMC ONLY)
Bacteria, UA: NONE SEEN
Bilirubin Urine: NEGATIVE
Glucose, UA: >500 mg/dL — AB
Ketones, ur: NEGATIVE mg/dL
Nitrite: NEGATIVE
PROTEIN: NEGATIVE mg/dL
Specific Gravity, Urine: 1.011 (ref 1.005–1.030)
pH: 5 (ref 5.0–8.0)

## 2015-07-25 LAB — GLUCOSE, CAPILLARY
GLUCOSE-CAPILLARY: 101 mg/dL — AB (ref 65–99)
GLUCOSE-CAPILLARY: 320 mg/dL — AB (ref 65–99)
GLUCOSE-CAPILLARY: 168 mg/dL — AB (ref 65–99)
Glucose-Capillary: 187 mg/dL — ABNORMAL HIGH (ref 65–99)

## 2015-07-26 DIAGNOSIS — E119 Type 2 diabetes mellitus without complications: Secondary | ICD-10-CM | POA: Diagnosis not present

## 2015-07-26 LAB — URINE CULTURE

## 2015-07-26 LAB — GLUCOSE, CAPILLARY
GLUCOSE-CAPILLARY: 88 mg/dL (ref 65–99)
GLUCOSE-CAPILLARY: 331 mg/dL — AB (ref 65–99)
Glucose-Capillary: 182 mg/dL — ABNORMAL HIGH (ref 65–99)
Glucose-Capillary: 294 mg/dL — ABNORMAL HIGH (ref 65–99)

## 2015-07-27 DIAGNOSIS — E119 Type 2 diabetes mellitus without complications: Secondary | ICD-10-CM | POA: Diagnosis not present

## 2015-07-27 LAB — GLUCOSE, CAPILLARY
GLUCOSE-CAPILLARY: 147 mg/dL — AB (ref 65–99)
Glucose-Capillary: 136 mg/dL — ABNORMAL HIGH (ref 65–99)
Glucose-Capillary: 175 mg/dL — ABNORMAL HIGH (ref 65–99)
Glucose-Capillary: 152 mg/dL — ABNORMAL HIGH (ref 65–99)

## 2015-07-28 DIAGNOSIS — E119 Type 2 diabetes mellitus without complications: Secondary | ICD-10-CM | POA: Diagnosis not present

## 2015-07-28 LAB — GLUCOSE, CAPILLARY
GLUCOSE-CAPILLARY: 292 mg/dL — AB (ref 65–99)
Glucose-Capillary: 53 mg/dL — ABNORMAL LOW (ref 65–99)
Glucose-Capillary: 62 mg/dL — ABNORMAL LOW (ref 65–99)
Glucose-Capillary: 317 mg/dL — ABNORMAL HIGH (ref 65–99)
Glucose-Capillary: 270 mg/dL — ABNORMAL HIGH (ref 65–99)

## 2015-07-29 ENCOUNTER — Encounter
Admission: RE | Admit: 2015-07-29 | Discharge: 2015-07-29 | Disposition: A | Discharge status: Home / Self Care | Source: Ambulatory Visit | Attending: Internal Medicine | Admitting: Internal Medicine

## 2015-07-29 DIAGNOSIS — R7309 Other abnormal glucose: Secondary | ICD-10-CM | POA: Insufficient documentation

## 2015-07-29 DIAGNOSIS — R410 Disorientation, unspecified: Secondary | ICD-10-CM | POA: Diagnosis present

## 2015-07-29 LAB — GLUCOSE, CAPILLARY
GLUCOSE-CAPILLARY: 127 mg/dL — AB (ref 65–99)
GLUCOSE-CAPILLARY: 249 mg/dL — AB (ref 65–99)
Glucose-Capillary: 323 mg/dL — ABNORMAL HIGH (ref 65–99)

## 2015-07-30 DIAGNOSIS — R410 Disorientation, unspecified: Secondary | ICD-10-CM | POA: Diagnosis not present

## 2015-07-30 LAB — GLUCOSE, CAPILLARY: Glucose-Capillary: 213 mg/dL — ABNORMAL HIGH (ref 65–99)

## 2015-08-03 DIAGNOSIS — R410 Disorientation, unspecified: Secondary | ICD-10-CM | POA: Diagnosis not present

## 2015-08-04 LAB — URINALYSIS COMPLETE WITH MICROSCOPIC (ARMC ONLY)
BILIRUBIN URINE: NEGATIVE
Bacteria, UA: NONE SEEN
Glucose, UA: >500 mg/dL — AB
KETONES UR: NEGATIVE mg/dL
LEUKOCYTES UA: NEGATIVE
NITRITE: NEGATIVE
PH: 7 (ref 5.0–8.0)
Protein, ur: NEGATIVE mg/dL
Specific Gravity, Urine: 1.012 (ref 1.005–1.030)

## 2015-08-05 LAB — URINE CULTURE

## 2015-08-17 DIAGNOSIS — R410 Disorientation, unspecified: Secondary | ICD-10-CM | POA: Diagnosis not present

## 2015-08-17 LAB — URINALYSIS COMPLETE WITH MICROSCOPIC (ARMC ONLY)
BILIRUBIN URINE: NEGATIVE
Bacteria, UA: NONE SEEN
GLUCOSE, UA: 50 mg/dL — AB
Ketones, ur: NEGATIVE mg/dL
NITRITE: NEGATIVE
Protein, ur: 30 mg/dL — AB
Specific Gravity, Urine: 1.014 (ref 1.005–1.030)
pH: 7 (ref 5.0–8.0)

## 2015-08-18 LAB — URINE CULTURE

## 2015-08-20 DIAGNOSIS — R410 Disorientation, unspecified: Secondary | ICD-10-CM | POA: Diagnosis not present

## 2015-08-20 LAB — GLUCOSE, CAPILLARY
GLUCOSE-CAPILLARY: 80 mg/dL (ref 65–99)
GLUCOSE-CAPILLARY: 209 mg/dL — AB (ref 65–99)
GLUCOSE-CAPILLARY: 334 mg/dL — AB (ref 65–99)
GLUCOSE-CAPILLARY: 237 mg/dL — AB (ref 65–99)

## 2015-08-21 DIAGNOSIS — R410 Disorientation, unspecified: Secondary | ICD-10-CM | POA: Diagnosis not present

## 2015-08-21 LAB — GLUCOSE, CAPILLARY
GLUCOSE-CAPILLARY: 315 mg/dL — AB (ref 65–99)
GLUCOSE-CAPILLARY: 260 mg/dL — AB (ref 65–99)
Glucose-Capillary: 90 mg/dL (ref 65–99)
Glucose-Capillary: 298 mg/dL — ABNORMAL HIGH (ref 65–99)

## 2015-08-22 DIAGNOSIS — R410 Disorientation, unspecified: Secondary | ICD-10-CM | POA: Diagnosis not present

## 2015-08-22 LAB — GLUCOSE, CAPILLARY
GLUCOSE-CAPILLARY: 316 mg/dL — AB (ref 65–99)
GLUCOSE-CAPILLARY: 242 mg/dL — AB (ref 65–99)
Glucose-Capillary: 154 mg/dL — ABNORMAL HIGH (ref 65–99)
Glucose-Capillary: 292 mg/dL — ABNORMAL HIGH (ref 65–99)

## 2015-08-23 DIAGNOSIS — R410 Disorientation, unspecified: Secondary | ICD-10-CM | POA: Diagnosis not present

## 2015-08-23 LAB — URINALYSIS COMPLETE WITH MICROSCOPIC (ARMC ONLY)
Bacteria, UA: NONE SEEN
Bilirubin Urine: NEGATIVE
Glucose, UA: >500 mg/dL — AB
KETONES UR: NEGATIVE mg/dL
LEUKOCYTES UA: NEGATIVE
Nitrite: NEGATIVE
PH: 6 (ref 5.0–8.0)
PROTEIN: 30 mg/dL — AB
SPECIFIC GRAVITY, URINE: 1.013 (ref 1.005–1.030)

## 2015-08-23 LAB — GLUCOSE, CAPILLARY
GLUCOSE-CAPILLARY: 180 mg/dL — AB (ref 65–99)
Glucose-Capillary: 79 mg/dL (ref 65–99)
Glucose-Capillary: 242 mg/dL — ABNORMAL HIGH (ref 65–99)
Glucose-Capillary: 170 mg/dL — ABNORMAL HIGH (ref 65–99)

## 2015-08-24 DIAGNOSIS — R410 Disorientation, unspecified: Secondary | ICD-10-CM | POA: Diagnosis not present

## 2015-08-24 LAB — GLUCOSE, CAPILLARY
GLUCOSE-CAPILLARY: 78 mg/dL (ref 65–99)
GLUCOSE-CAPILLARY: 263 mg/dL — AB (ref 65–99)
GLUCOSE-CAPILLARY: 277 mg/dL — AB (ref 65–99)
Glucose-Capillary: 132 mg/dL — ABNORMAL HIGH (ref 65–99)

## 2015-08-24 LAB — URINE CULTURE

## 2015-08-25 DIAGNOSIS — R410 Disorientation, unspecified: Secondary | ICD-10-CM | POA: Diagnosis not present

## 2015-08-25 LAB — GLUCOSE, CAPILLARY
GLUCOSE-CAPILLARY: 327 mg/dL — AB (ref 65–99)
Glucose-Capillary: 125 mg/dL — ABNORMAL HIGH (ref 65–99)
Glucose-Capillary: 241 mg/dL — ABNORMAL HIGH (ref 65–99)
Glucose-Capillary: 198 mg/dL — ABNORMAL HIGH (ref 65–99)

## 2015-08-26 DIAGNOSIS — R410 Disorientation, unspecified: Secondary | ICD-10-CM | POA: Diagnosis not present

## 2015-08-26 LAB — GLUCOSE, CAPILLARY
Glucose-Capillary: 150 mg/dL — ABNORMAL HIGH (ref 65–99)
Glucose-Capillary: 211 mg/dL — ABNORMAL HIGH (ref 65–99)
Glucose-Capillary: 204 mg/dL — ABNORMAL HIGH (ref 65–99)
Glucose-Capillary: 304 mg/dL — ABNORMAL HIGH (ref 65–99)

## 2015-08-27 DIAGNOSIS — R410 Disorientation, unspecified: Secondary | ICD-10-CM | POA: Diagnosis not present

## 2015-08-27 LAB — GLUCOSE, CAPILLARY
GLUCOSE-CAPILLARY: 243 mg/dL — AB (ref 65–99)
GLUCOSE-CAPILLARY: 291 mg/dL — AB (ref 65–99)
Glucose-Capillary: 108 mg/dL — ABNORMAL HIGH (ref 65–99)
Glucose-Capillary: 307 mg/dL — ABNORMAL HIGH (ref 65–99)

## 2015-08-28 DIAGNOSIS — R410 Disorientation, unspecified: Secondary | ICD-10-CM | POA: Diagnosis not present

## 2015-08-28 LAB — GLUCOSE, CAPILLARY
Glucose-Capillary: 156 mg/dL — ABNORMAL HIGH (ref 65–99)
Glucose-Capillary: 220 mg/dL — ABNORMAL HIGH (ref 65–99)
Glucose-Capillary: 350 mg/dL — ABNORMAL HIGH (ref 65–99)

## 2015-08-29 ENCOUNTER — Encounter
Admission: RE | Admit: 2015-08-29 | Discharge: 2015-08-29 | Disposition: A | Discharge status: Home / Self Care | Source: Ambulatory Visit | Attending: Internal Medicine | Admitting: Internal Medicine

## 2015-08-29 DIAGNOSIS — E119 Type 2 diabetes mellitus without complications: Secondary | ICD-10-CM | POA: Diagnosis not present

## 2015-08-29 LAB — GLUCOSE, CAPILLARY
GLUCOSE-CAPILLARY: 195 mg/dL — AB (ref 65–99)
Glucose-Capillary: 156 mg/dL — ABNORMAL HIGH (ref 65–99)

## 2015-08-30 DIAGNOSIS — E119 Type 2 diabetes mellitus without complications: Secondary | ICD-10-CM | POA: Diagnosis not present

## 2015-08-30 LAB — GLUCOSE, CAPILLARY
GLUCOSE-CAPILLARY: 59 mg/dL — AB (ref 65–99)
GLUCOSE-CAPILLARY: 72 mg/dL (ref 65–99)
GLUCOSE-CAPILLARY: 268 mg/dL — AB (ref 65–99)
GLUCOSE-CAPILLARY: 164 mg/dL — AB (ref 65–99)
Glucose-Capillary: 316 mg/dL — ABNORMAL HIGH (ref 65–99)

## 2015-08-31 DIAGNOSIS — E119 Type 2 diabetes mellitus without complications: Secondary | ICD-10-CM | POA: Diagnosis not present

## 2015-08-31 LAB — GLUCOSE, CAPILLARY
GLUCOSE-CAPILLARY: 108 mg/dL — AB (ref 65–99)
Glucose-Capillary: 297 mg/dL — ABNORMAL HIGH (ref 65–99)
Glucose-Capillary: 265 mg/dL — ABNORMAL HIGH (ref 65–99)

## 2015-09-01 DIAGNOSIS — E119 Type 2 diabetes mellitus without complications: Secondary | ICD-10-CM | POA: Diagnosis not present

## 2015-09-01 LAB — GLUCOSE, CAPILLARY
GLUCOSE-CAPILLARY: 142 mg/dL — AB (ref 65–99)
GLUCOSE-CAPILLARY: 152 mg/dL — AB (ref 65–99)
GLUCOSE-CAPILLARY: 272 mg/dL — AB (ref 65–99)
Glucose-Capillary: 111 mg/dL — ABNORMAL HIGH (ref 65–99)
Glucose-Capillary: 194 mg/dL — ABNORMAL HIGH (ref 65–99)

## 2015-09-02 DIAGNOSIS — E119 Type 2 diabetes mellitus without complications: Secondary | ICD-10-CM | POA: Diagnosis not present

## 2015-09-02 LAB — GLUCOSE, CAPILLARY
GLUCOSE-CAPILLARY: 68 mg/dL (ref 65–99)
GLUCOSE-CAPILLARY: 163 mg/dL — AB (ref 65–99)
GLUCOSE-CAPILLARY: 234 mg/dL — AB (ref 65–99)
GLUCOSE-CAPILLARY: 209 mg/dL — AB (ref 65–99)

## 2015-09-03 DIAGNOSIS — E119 Type 2 diabetes mellitus without complications: Secondary | ICD-10-CM | POA: Diagnosis not present

## 2015-09-03 LAB — GLUCOSE, CAPILLARY
GLUCOSE-CAPILLARY: 164 mg/dL — AB (ref 65–99)
GLUCOSE-CAPILLARY: 213 mg/dL — AB (ref 65–99)
Glucose-Capillary: 52 mg/dL — ABNORMAL LOW (ref 65–99)
Glucose-Capillary: 61 mg/dL — ABNORMAL LOW (ref 65–99)
Glucose-Capillary: 239 mg/dL — ABNORMAL HIGH (ref 65–99)

## 2015-09-04 DIAGNOSIS — E119 Type 2 diabetes mellitus without complications: Secondary | ICD-10-CM | POA: Diagnosis not present

## 2015-09-04 LAB — GLUCOSE, CAPILLARY
GLUCOSE-CAPILLARY: 268 mg/dL — AB (ref 65–99)
Glucose-Capillary: 52 mg/dL — ABNORMAL LOW (ref 65–99)
Glucose-Capillary: 68 mg/dL (ref 65–99)
Glucose-Capillary: 121 mg/dL — ABNORMAL HIGH (ref 65–99)
Glucose-Capillary: 148 mg/dL — ABNORMAL HIGH (ref 65–99)

## 2015-09-05 DIAGNOSIS — E119 Type 2 diabetes mellitus without complications: Secondary | ICD-10-CM | POA: Diagnosis not present

## 2015-09-05 LAB — GLUCOSE, CAPILLARY
GLUCOSE-CAPILLARY: 82 mg/dL (ref 65–99)
GLUCOSE-CAPILLARY: 244 mg/dL — AB (ref 65–99)
GLUCOSE-CAPILLARY: 247 mg/dL — AB (ref 65–99)

## 2015-09-06 DIAGNOSIS — E119 Type 2 diabetes mellitus without complications: Secondary | ICD-10-CM | POA: Diagnosis not present

## 2015-09-06 LAB — GLUCOSE, CAPILLARY
GLUCOSE-CAPILLARY: 190 mg/dL — AB (ref 65–99)
GLUCOSE-CAPILLARY: 263 mg/dL — AB (ref 65–99)
GLUCOSE-CAPILLARY: 338 mg/dL — AB (ref 65–99)
GLUCOSE-CAPILLARY: 141 mg/dL — AB (ref 65–99)
Glucose-Capillary: 118 mg/dL — ABNORMAL HIGH (ref 65–99)

## 2015-09-07 DIAGNOSIS — E119 Type 2 diabetes mellitus without complications: Secondary | ICD-10-CM | POA: Diagnosis not present

## 2015-09-07 LAB — GLUCOSE, CAPILLARY
GLUCOSE-CAPILLARY: 52 mg/dL — AB (ref 65–99)
GLUCOSE-CAPILLARY: 123 mg/dL — AB (ref 65–99)
GLUCOSE-CAPILLARY: 235 mg/dL — AB (ref 65–99)
GLUCOSE-CAPILLARY: 256 mg/dL — AB (ref 65–99)

## 2015-09-08 DIAGNOSIS — E119 Type 2 diabetes mellitus without complications: Secondary | ICD-10-CM | POA: Diagnosis not present

## 2015-09-08 LAB — GLUCOSE, CAPILLARY
GLUCOSE-CAPILLARY: 73 mg/dL (ref 65–99)
GLUCOSE-CAPILLARY: 141 mg/dL — AB (ref 65–99)
GLUCOSE-CAPILLARY: 167 mg/dL — AB (ref 65–99)
Glucose-Capillary: 230 mg/dL — ABNORMAL HIGH (ref 65–99)

## 2015-09-09 DIAGNOSIS — E119 Type 2 diabetes mellitus without complications: Secondary | ICD-10-CM | POA: Diagnosis not present

## 2015-09-09 LAB — GLUCOSE, CAPILLARY
GLUCOSE-CAPILLARY: 83 mg/dL (ref 65–99)
GLUCOSE-CAPILLARY: 188 mg/dL — AB (ref 65–99)
Glucose-Capillary: 198 mg/dL — ABNORMAL HIGH (ref 65–99)
Glucose-Capillary: 304 mg/dL — ABNORMAL HIGH (ref 65–99)

## 2015-09-10 DIAGNOSIS — E119 Type 2 diabetes mellitus without complications: Secondary | ICD-10-CM | POA: Diagnosis not present

## 2015-09-10 LAB — GLUCOSE, CAPILLARY
GLUCOSE-CAPILLARY: 67 mg/dL (ref 65–99)
GLUCOSE-CAPILLARY: 313 mg/dL — AB (ref 65–99)
GLUCOSE-CAPILLARY: 151 mg/dL — AB (ref 65–99)
GLUCOSE-CAPILLARY: 193 mg/dL — AB (ref 65–99)

## 2015-09-11 DIAGNOSIS — E119 Type 2 diabetes mellitus without complications: Secondary | ICD-10-CM | POA: Diagnosis not present

## 2015-09-11 LAB — GLUCOSE, CAPILLARY
GLUCOSE-CAPILLARY: 128 mg/dL — AB (ref 65–99)
Glucose-Capillary: 97 mg/dL (ref 65–99)
Glucose-Capillary: 148 mg/dL — ABNORMAL HIGH (ref 65–99)
Glucose-Capillary: 223 mg/dL — ABNORMAL HIGH (ref 65–99)

## 2015-09-12 DIAGNOSIS — E119 Type 2 diabetes mellitus without complications: Secondary | ICD-10-CM | POA: Diagnosis not present

## 2015-09-12 LAB — GLUCOSE, CAPILLARY
GLUCOSE-CAPILLARY: 69 mg/dL (ref 65–99)
GLUCOSE-CAPILLARY: 168 mg/dL — AB (ref 65–99)
Glucose-Capillary: 291 mg/dL — ABNORMAL HIGH (ref 65–99)
Glucose-Capillary: 142 mg/dL — ABNORMAL HIGH (ref 65–99)

## 2015-09-13 ENCOUNTER — Emergency Department

## 2015-09-13 ENCOUNTER — Inpatient Hospital Stay: Admitting: Anesthesiology

## 2015-09-13 ENCOUNTER — Inpatient Hospital Stay

## 2015-09-13 ENCOUNTER — Inpatient Hospital Stay
Admission: EM | Admit: 2015-09-13 | Discharge: 2015-09-15 | DRG: 481 | Disposition: A | Discharge status: Skilled Nursing Facility | Attending: Internal Medicine | Admitting: Internal Medicine

## 2015-09-13 ENCOUNTER — Encounter: Payer: Self-pay | Admitting: *Deleted

## 2015-09-13 ENCOUNTER — Encounter
Admission: EM | Disposition: A | Discharge status: Skilled Nursing Facility | Payer: Self-pay | Source: Home / Self Care | Attending: Internal Medicine

## 2015-09-13 DIAGNOSIS — Z66 Do not resuscitate: Secondary | ICD-10-CM | POA: Diagnosis present

## 2015-09-13 DIAGNOSIS — E119 Type 2 diabetes mellitus without complications: Secondary | ICD-10-CM | POA: Diagnosis present

## 2015-09-13 DIAGNOSIS — D649 Anemia, unspecified: Secondary | ICD-10-CM | POA: Diagnosis present

## 2015-09-13 DIAGNOSIS — F0390 Unspecified dementia without behavioral disturbance: Secondary | ICD-10-CM | POA: Diagnosis present

## 2015-09-13 DIAGNOSIS — Z794 Long term (current) use of insulin: Secondary | ICD-10-CM

## 2015-09-13 DIAGNOSIS — Z85850 Personal history of malignant neoplasm of thyroid: Secondary | ICD-10-CM | POA: Diagnosis not present

## 2015-09-13 DIAGNOSIS — E890 Postprocedural hypothyroidism: Secondary | ICD-10-CM | POA: Diagnosis present

## 2015-09-13 DIAGNOSIS — S0083XA Contusion of other part of head, initial encounter: Secondary | ICD-10-CM | POA: Diagnosis present

## 2015-09-13 DIAGNOSIS — W19XXXA Unspecified fall, initial encounter: Secondary | ICD-10-CM | POA: Diagnosis present

## 2015-09-13 DIAGNOSIS — Z886 Allergy status to analgesic agent status: Secondary | ICD-10-CM | POA: Diagnosis not present

## 2015-09-13 DIAGNOSIS — I10 Essential (primary) hypertension: Secondary | ICD-10-CM | POA: Diagnosis present

## 2015-09-13 DIAGNOSIS — S72009A Fracture of unspecified part of neck of unspecified femur, initial encounter for closed fracture: Secondary | ICD-10-CM | POA: Diagnosis present

## 2015-09-13 DIAGNOSIS — M353 Polymyalgia rheumatica: Secondary | ICD-10-CM | POA: Diagnosis present

## 2015-09-13 DIAGNOSIS — Z79899 Other long term (current) drug therapy: Secondary | ICD-10-CM

## 2015-09-13 DIAGNOSIS — Z888 Allergy status to other drugs, medicaments and biological substances status: Secondary | ICD-10-CM

## 2015-09-13 DIAGNOSIS — E222 Syndrome of inappropriate secretion of antidiuretic hormone: Secondary | ICD-10-CM | POA: Diagnosis present

## 2015-09-13 DIAGNOSIS — S72002A Fracture of unspecified part of neck of left femur, initial encounter for closed fracture: Secondary | ICD-10-CM

## 2015-09-13 DIAGNOSIS — Z419 Encounter for procedure for purposes other than remedying health state, unspecified: Secondary | ICD-10-CM

## 2015-09-13 DIAGNOSIS — I471 Supraventricular tachycardia: Secondary | ICD-10-CM | POA: Diagnosis present

## 2015-09-13 DIAGNOSIS — M25552 Pain in left hip: Secondary | ICD-10-CM | POA: Diagnosis present

## 2015-09-13 DIAGNOSIS — S72142A Displaced intertrochanteric fracture of left femur, initial encounter for closed fracture: Secondary | ICD-10-CM | POA: Diagnosis present

## 2015-09-13 HISTORY — PX: INTRAMEDULLARY (IM) NAIL INTERTROCHANTERIC: SHX5875

## 2015-09-13 LAB — GLUCOSE, CAPILLARY
Glucose-Capillary: 124 mg/dL — ABNORMAL HIGH (ref 65–99)
Glucose-Capillary: 187 mg/dL — ABNORMAL HIGH (ref 65–99)
Glucose-Capillary: 185 mg/dL — ABNORMAL HIGH (ref 65–99)
Glucose-Capillary: 186 mg/dL — ABNORMAL HIGH (ref 65–99)
Glucose-Capillary: 143 mg/dL — ABNORMAL HIGH (ref 65–99)

## 2015-09-13 LAB — CBC WITH DIFFERENTIAL/PLATELET
Basophils Absolute: 0.1 10*3/uL (ref 0–0.1)
Basophils Relative: 1 %
EOS ABS: 0 10*3/uL (ref 0–0.7)
Eosinophils Relative: 0 %
HEMATOCRIT: 34.5 % — AB (ref 35.0–47.0)
HEMOGLOBIN: 11.1 g/dL — AB (ref 12.0–16.0)
LYMPHS ABS: 3.7 10*3/uL — AB (ref 1.0–3.6)
LYMPHS PCT: 30 %
MCH: 26.8 pg (ref 26.0–34.0)
MCHC: 32.1 g/dL (ref 32.0–36.0)
MCV: 83.5 fL (ref 80.0–100.0)
MONOS PCT: 7 %
Monocytes Absolute: 0.9 10*3/uL (ref 0.2–0.9)
NEUTROS PCT: 62 %
Neutro Abs: 7.5 10*3/uL — ABNORMAL HIGH (ref 1.4–6.5)
Platelets: 379 10*3/uL (ref 150–440)
RBC: 4.14 MIL/uL (ref 3.80–5.20)
RDW: 14.2 % (ref 11.5–14.5)
WBC: 12.1 10*3/uL — AB (ref 3.6–11.0)

## 2015-09-13 LAB — ALBUMIN: ALBUMIN: 3.1 g/dL — AB (ref 3.5–5.0)

## 2015-09-13 LAB — BASIC METABOLIC PANEL
Anion gap: 4 — ABNORMAL LOW (ref 5–15)
BUN: 37 mg/dL — AB (ref 6–20)
CHLORIDE: 93 mmol/L — AB (ref 101–111)
CO2: 33 mmol/L — AB (ref 22–32)
CREATININE: 0.58 mg/dL (ref 0.44–1.00)
Calcium: 9.5 mg/dL (ref 8.9–10.3)
GFR calc Af Amer: >60 mL/min (ref >60)
GFR calc non Af Amer: >60 mL/min (ref >60)
GLUCOSE: 77 mg/dL (ref 65–99)
POTASSIUM: 4 mmol/L (ref 3.5–5.1)
SODIUM: 130 mmol/L — AB (ref 135–145)

## 2015-09-13 LAB — PROTIME-INR
INR: 0.98
Prothrombin Time: 13 seconds (ref 11.4–15.2)

## 2015-09-13 LAB — HEMOGLOBIN A1C: Hgb A1c MFr Bld: 8 % — ABNORMAL HIGH (ref 4.0–6.0)

## 2015-09-13 LAB — CALCIUM: CALCIUM: 9.3 mg/dL (ref 8.9–10.3)

## 2015-09-13 LAB — APTT: APTT: 28 s (ref 24–36)

## 2015-09-13 LAB — TSH: TSH: 1.562 u[IU]/mL (ref 0.350–4.500)

## 2015-09-13 LAB — TROPONIN I: Troponin I: <0.03 ng/mL (ref <0.03)

## 2015-09-13 SURGERY — FIXATION, FRACTURE, INTERTROCHANTERIC, WITH INTRAMEDULLARY ROD
Anesthesia: General | Site: Hip | Laterality: Left | Wound class: Clean

## 2015-09-13 MED ORDER — LACTATED RINGERS IV SOLN
INTRAVENOUS | Status: DC | PRN
  Administered 2015-09-13: 13:00:00 via INTRAVENOUS

## 2015-09-13 MED ORDER — SODIUM CHLORIDE 0.9 % IV SOLN
75.0000 mL/h | INTRAVENOUS | Status: DC
  Administered 2015-09-13: 75 mL/h via INTRAVENOUS

## 2015-09-13 MED ORDER — ENOXAPARIN SODIUM 40 MG/0.4ML ~~LOC~~ SOLN
40.0000 mg | SUBCUTANEOUS | Status: DC
  Administered 2015-09-14 – 2015-09-15 (×2): 40 mg via SUBCUTANEOUS
  Filled 2015-09-13 (×2): qty 0.4

## 2015-09-13 MED ORDER — FENTANYL CITRATE (PF) 100 MCG/2ML IJ SOLN
INTRAMUSCULAR | Status: DC | PRN
  Administered 2015-09-13: 50 ug via INTRAVENOUS
  Administered 2015-09-13 (×2): 25 ug via INTRAVENOUS

## 2015-09-13 MED ORDER — AMLODIPINE BESYLATE 5 MG PO TABS
5.0000 mg | ORAL_TABLET | Freq: Every day | ORAL | Status: DC
  Administered 2015-09-13 – 2015-09-15 (×2): 5 mg via ORAL
  Filled 2015-09-13 (×3): qty 1

## 2015-09-13 MED ORDER — LACTATED RINGERS IV SOLN
INTRAVENOUS | Status: DC
  Administered 2015-09-13: 14:00:00 via INTRAVENOUS

## 2015-09-13 MED ORDER — ONDANSETRON HCL 4 MG PO TABS: 4.0000 mg | ORAL_TABLET | Freq: Four times a day (QID) | ORAL | Status: DC | PRN

## 2015-09-13 MED ORDER — METHOCARBAMOL 1000 MG/10ML IJ SOLN
500.0000 mg | Freq: Four times a day (QID) | INTRAMUSCULAR | Status: DC | PRN
  Filled 2015-09-13: qty 5

## 2015-09-13 MED ORDER — ONDANSETRON HCL 4 MG/2ML IJ SOLN
4.0000 mg | Freq: Four times a day (QID) | INTRAMUSCULAR | Status: DC | PRN
  Administered 2015-09-13: 4 mg via INTRAVENOUS
  Filled 2015-09-13: qty 2

## 2015-09-13 MED ORDER — METOPROLOL TARTRATE 50 MG PO TABS
50.0000 mg | ORAL_TABLET | Freq: Two times a day (BID) | ORAL | Status: DC
  Administered 2015-09-13 – 2015-09-15 (×3): 50 mg via ORAL
  Filled 2015-09-13 (×5): qty 1

## 2015-09-13 MED ORDER — CALCIUM CARBONATE-VITAMIN D 500-200 MG-UNIT PO TABS
1.0000 | ORAL_TABLET | Freq: Two times a day (BID) | ORAL | Status: DC
  Administered 2015-09-14 – 2015-09-15 (×3): 1 via ORAL
  Filled 2015-09-13 (×3): qty 1

## 2015-09-13 MED ORDER — ACETAMINOPHEN 325 MG PO TABS
650.0000 mg | ORAL_TABLET | Freq: Four times a day (QID) | ORAL | Status: DC | PRN
  Administered 2015-09-15: 650 mg via ORAL
  Filled 2015-09-13: qty 2

## 2015-09-13 MED ORDER — MORPHINE SULFATE (PF) 2 MG/ML IV SOLN: 2.0000 mg | INTRAVENOUS | Status: DC | PRN

## 2015-09-13 MED ORDER — PROPOFOL 10 MG/ML IV BOLUS
INTRAVENOUS | Status: DC | PRN
  Administered 2015-09-13: 60 mg via INTRAVENOUS

## 2015-09-13 MED ORDER — METHOCARBAMOL 500 MG PO TABS: 500.0000 mg | ORAL_TABLET | Freq: Four times a day (QID) | ORAL | Status: DC | PRN

## 2015-09-13 MED ORDER — SUCCINYLCHOLINE CHLORIDE 20 MG/ML IJ SOLN
INTRAMUSCULAR | Status: DC | PRN
  Administered 2015-09-13: 60 mg via INTRAVENOUS

## 2015-09-13 MED ORDER — SOTALOL HCL 80 MG PO TABS
40.0000 mg | ORAL_TABLET | Freq: Two times a day (BID) | ORAL | Status: DC
  Administered 2015-09-13 – 2015-09-15 (×3): 40 mg via ORAL
  Filled 2015-09-13: qty 0.5
  Filled 2015-09-13: qty 2
  Filled 2015-09-13 (×3): qty 1
  Filled 2015-09-13: qty 0.5

## 2015-09-13 MED ORDER — SODIUM CHLORIDE FLUSH 0.9 % IV SOLN
INTRAVENOUS | Status: AC
  Filled 2015-09-13: qty 10

## 2015-09-13 MED ORDER — EPHEDRINE SULFATE 50 MG/ML IJ SOLN
INTRAMUSCULAR | Status: DC | PRN
  Administered 2015-09-13 (×2): 5 mg via INTRAVENOUS
  Administered 2015-09-13: 10 mg via INTRAVENOUS
  Administered 2015-09-13: 5 mg via INTRAVENOUS

## 2015-09-13 MED ORDER — FENTANYL CITRATE (PF) 100 MCG/2ML IJ SOLN
INTRAMUSCULAR | Status: AC
  Administered 2015-09-13: 25 ug via INTRAVENOUS
  Filled 2015-09-13: qty 2

## 2015-09-13 MED ORDER — DOCUSATE SODIUM 100 MG PO CAPS
100.0000 mg | ORAL_CAPSULE | Freq: Two times a day (BID) | ORAL | Status: DC
  Administered 2015-09-13 – 2015-09-15 (×4): 100 mg via ORAL
  Filled 2015-09-13 (×5): qty 1

## 2015-09-13 MED ORDER — BISACODYL 10 MG RE SUPP: 10.0000 mg | Freq: Every day | RECTAL | Status: DC | PRN

## 2015-09-13 MED ORDER — TRAMADOL HCL 50 MG PO TABS
50.0000 mg | ORAL_TABLET | Freq: Four times a day (QID) | ORAL | Status: DC | PRN
  Administered 2015-09-13: 50 mg via ORAL
  Filled 2015-09-13: qty 1

## 2015-09-13 MED ORDER — ACETAMINOPHEN 500 MG PO TABS
1000.0000 mg | ORAL_TABLET | Freq: Four times a day (QID) | ORAL | Status: AC
  Administered 2015-09-14 (×3): 1000 mg via ORAL
  Filled 2015-09-13 (×3): qty 2

## 2015-09-13 MED ORDER — DEMECLOCYCLINE HCL 150 MG PO TABS
450.0000 mg | ORAL_TABLET | Freq: Two times a day (BID) | ORAL | Status: DC
  Administered 2015-09-13 – 2015-09-15 (×5): 450 mg via ORAL
  Filled 2015-09-13 (×6): qty 3

## 2015-09-13 MED ORDER — LIDOCAINE HCL (CARDIAC) 20 MG/ML IV SOLN
INTRAVENOUS | Status: DC | PRN
  Administered 2015-09-13: 50 mg via INTRAVENOUS

## 2015-09-13 MED ORDER — MAGNESIUM CITRATE PO SOLN
1.0000 | Freq: Once | ORAL | Status: DC | PRN
  Filled 2015-09-13: qty 296

## 2015-09-13 MED ORDER — SUGAMMADEX SODIUM 200 MG/2ML IV SOLN
INTRAVENOUS | Status: DC | PRN
  Administered 2015-09-13: 100 mg via INTRAVENOUS

## 2015-09-13 MED ORDER — NEOMYCIN-POLYMYXIN B GU 40-200000 IR SOLN
Status: DC | PRN
  Administered 2015-09-13: 2 mL

## 2015-09-13 MED ORDER — MENTHOL 3 MG MT LOZG
1.0000 | LOZENGE | OROMUCOSAL | Status: DC | PRN
  Filled 2015-09-13: qty 9

## 2015-09-13 MED ORDER — TRAMADOL HCL 50 MG PO TABS: 50.0000 mg | ORAL_TABLET | Freq: Four times a day (QID) | ORAL | Status: DC | PRN

## 2015-09-13 MED ORDER — FENTANYL CITRATE (PF) 100 MCG/2ML IJ SOLN
25.0000 ug | INTRAMUSCULAR | Status: DC | PRN
  Administered 2015-09-13 (×3): 25 ug via INTRAVENOUS

## 2015-09-13 MED ORDER — GLUCAGON HCL (RDNA) 1 MG IJ SOLR: 1.0000 mg | Freq: Once | INTRAMUSCULAR | Status: DC | PRN

## 2015-09-13 MED ORDER — PANTOPRAZOLE SODIUM 40 MG PO TBEC
40.0000 mg | DELAYED_RELEASE_TABLET | Freq: Every day | ORAL | Status: DC
  Administered 2015-09-13 – 2015-09-15 (×3): 40 mg via ORAL
  Filled 2015-09-13 (×3): qty 1

## 2015-09-13 MED ORDER — DEXTROSE-NACL 5-0.9 % IV SOLN
INTRAVENOUS | Status: DC
  Administered 2015-09-13: 07:00:00 via INTRAVENOUS

## 2015-09-13 MED ORDER — CEFAZOLIN SODIUM-DEXTROSE 2-4 GM/100ML-% IV SOLN
2.0000 g | Freq: Four times a day (QID) | INTRAVENOUS | Status: AC
  Administered 2015-09-13 – 2015-09-14 (×2): 2 g via INTRAVENOUS
  Filled 2015-09-13 (×2): qty 100

## 2015-09-13 MED ORDER — PHENOL 1.4 % MT LIQD
1.0000 | OROMUCOSAL | Status: DC | PRN
  Filled 2015-09-13: qty 177

## 2015-09-13 MED ORDER — PREDNISONE 5 MG PO TABS
7.5000 mg | ORAL_TABLET | Freq: Every day | ORAL | Status: DC
  Administered 2015-09-13 – 2015-09-15 (×3): 7.5 mg via ORAL
  Filled 2015-09-13: qty 2
  Filled 2015-09-13: qty 1.5
  Filled 2015-09-13: qty 2

## 2015-09-13 MED ORDER — FERROUS SULFATE 325 (65 FE) MG PO TABS
325.0000 mg | ORAL_TABLET | Freq: Three times a day (TID) | ORAL | Status: DC
  Administered 2015-09-14 – 2015-09-15 (×5): 325 mg via ORAL
  Filled 2015-09-13 (×5): qty 1

## 2015-09-13 MED ORDER — ACETAMINOPHEN 325 MG PO TABS
650.0000 mg | ORAL_TABLET | Freq: Three times a day (TID) | ORAL | Status: DC
  Administered 2015-09-13: 650 mg via ORAL
  Filled 2015-09-13: qty 2

## 2015-09-13 MED ORDER — CEFAZOLIN SODIUM-DEXTROSE 2-4 GM/100ML-% IV SOLN
INTRAVENOUS | Status: AC
  Filled 2015-09-13: qty 100

## 2015-09-13 MED ORDER — INSULIN ASPART 100 UNIT/ML ~~LOC~~ SOLN
0.0000 [IU] | Freq: Four times a day (QID) | SUBCUTANEOUS | Status: DC
  Administered 2015-09-13 – 2015-09-14 (×3): 2 [IU] via SUBCUTANEOUS
  Administered 2015-09-14: 1 [IU] via SUBCUTANEOUS
  Administered 2015-09-14: 3 [IU] via SUBCUTANEOUS
  Filled 2015-09-13: qty 1
  Filled 2015-09-13: qty 2
  Filled 2015-09-13: qty 3
  Filled 2015-09-13 (×2): qty 2

## 2015-09-13 MED ORDER — NEOMYCIN-POLYMYXIN B GU 40-200000 IR SOLN
Status: AC
  Filled 2015-09-13: qty 1

## 2015-09-13 MED ORDER — ONDANSETRON HCL 4 MG/2ML IJ SOLN
INTRAMUSCULAR | Status: DC | PRN
  Administered 2015-09-13: 4 mg via INTRAVENOUS

## 2015-09-13 MED ORDER — INSULIN DETEMIR 100 UNIT/ML ~~LOC~~ SOLN
6.0000 [IU] | Freq: Every day | SUBCUTANEOUS | Status: DC
  Administered 2015-09-13 – 2015-09-14 (×2): 6 [IU] via SUBCUTANEOUS
  Filled 2015-09-13 (×3): qty 0.06

## 2015-09-13 MED ORDER — ACETAMINOPHEN 650 MG RE SUPP: 650.0000 mg | Freq: Four times a day (QID) | RECTAL | Status: DC | PRN

## 2015-09-13 MED ORDER — GUAIFENESIN 100 MG/5ML PO SYRP
200.0000 mg | ORAL_SOLUTION | ORAL | Status: DC | PRN
  Filled 2015-09-13: qty 10

## 2015-09-13 MED ORDER — POLYETHYLENE GLYCOL 3350 17 G PO PACK: 17.0000 g | PACK | Freq: Every day | ORAL | Status: DC | PRN

## 2015-09-13 MED ORDER — SENNA 8.6 MG PO TABS
1.0000 | ORAL_TABLET | Freq: Two times a day (BID) | ORAL | Status: DC
  Administered 2015-09-13 – 2015-09-15 (×4): 8.6 mg via ORAL
  Filled 2015-09-13 (×4): qty 1

## 2015-09-13 MED ORDER — MORPHINE SULFATE (PF) 2 MG/ML IV SOLN
0.5000 mg | INTRAVENOUS | Status: DC | PRN
  Administered 2015-09-13: 0.5 mg via INTRAVENOUS
  Filled 2015-09-13: qty 1

## 2015-09-13 MED ORDER — LORAZEPAM 1 MG PO TABS
1.0000 mg | ORAL_TABLET | Freq: Every day | ORAL | Status: DC
  Administered 2015-09-13 – 2015-09-14 (×2): 1 mg via ORAL
  Filled 2015-09-13 (×2): qty 1

## 2015-09-13 MED ORDER — ROCURONIUM BROMIDE 100 MG/10ML IV SOLN
INTRAVENOUS | Status: DC | PRN
  Administered 2015-09-13: 20 mg via INTRAVENOUS

## 2015-09-13 MED ORDER — SENNOSIDES-DOCUSATE SODIUM 8.6-50 MG PO TABS
1.0000 | ORAL_TABLET | Freq: Two times a day (BID) | ORAL | Status: DC
  Administered 2015-09-13: 1 via ORAL
  Filled 2015-09-13: qty 1

## 2015-09-13 MED ORDER — MECLIZINE HCL 25 MG PO TABS
25.0000 mg | ORAL_TABLET | Freq: Three times a day (TID) | ORAL | Status: DC | PRN
Start: 2015-09-13 — End: 2015-09-15

## 2015-09-13 MED ORDER — POLYETHYLENE GLYCOL 3350 17 G PO PACK
17.0000 g | PACK | ORAL | Status: DC
Start: 2015-09-13 — End: 2015-09-15
  Administered 2015-09-13 – 2015-09-15 (×2): 17 g via ORAL
  Filled 2015-09-13 (×2): qty 1

## 2015-09-13 MED ORDER — ONDANSETRON HCL 4 MG/2ML IJ SOLN: 4.0000 mg | Freq: Four times a day (QID) | INTRAMUSCULAR | Status: DC | PRN

## 2015-09-13 MED ORDER — LEVOTHYROXINE SODIUM 150 MCG PO TABS
150.0000 ug | ORAL_TABLET | Freq: Every day | ORAL | Status: DC
  Administered 2015-09-13 – 2015-09-15 (×3): 150 ug via ORAL
  Filled 2015-09-13: qty 1
  Filled 2015-09-13: qty 3
  Filled 2015-09-13: qty 1

## 2015-09-13 MED ORDER — ONDANSETRON HCL 4 MG/2ML IJ SOLN: 4.0000 mg | Freq: Once | INTRAMUSCULAR | Status: DC | PRN

## 2015-09-13 MED ORDER — CEFAZOLIN SODIUM-DEXTROSE 2-4 GM/100ML-% IV SOLN
2.0000 g | INTRAVENOUS | Status: AC
  Administered 2015-09-13: 2 g via INTRAVENOUS
  Filled 2015-09-13: qty 100

## 2015-09-13 SURGICAL SUPPLY — 37 items
BIT DRILL 4.3MMS DISTAL GRDTED (BIT) ×1 IMPLANT
BNDG COHESIVE 6X5 TAN STRL LF (GAUZE/BANDAGES/DRESSINGS) ×6 IMPLANT
CANISTER SUCT 1200ML W/VALVE (MISCELLANEOUS) ×3 IMPLANT
DRAPE SHEET LG 3/4 BI-LAMINATE (DRAPES) ×6 IMPLANT
DRAPE SURG 17X11 SM STRL (DRAPES) ×6 IMPLANT
DRAPE U-SHAPE 47X51 STRL (DRAPES) ×3 IMPLANT
DRILL 4.3MMS DISTAL GRADUATED (BIT) ×3
DRSG OPSITE POSTOP 4X14 (GAUZE/BANDAGES/DRESSINGS) IMPLANT
DURAPREP 26ML APPLICATOR (WOUND CARE) ×3 IMPLANT
ELECT REM PT RETURN 9FT ADLT (ELECTROSURGICAL) ×3
ELECTRODE REM PT RTRN 9FT ADLT (ELECTROSURGICAL) ×1 IMPLANT
GLOVE BIOGEL PI IND STRL 9 (GLOVE) ×1 IMPLANT
GLOVE BIOGEL PI INDICATOR 9 (GLOVE) ×2
GLOVE SURG 9.0 ORTHO LTXF (GLOVE) ×6 IMPLANT
GOWN STRL REUS TWL 2XL XL LVL4 (GOWN DISPOSABLE) ×3 IMPLANT
GOWN STRL REUS W/ TWL LRG LVL3 (GOWN DISPOSABLE) ×1 IMPLANT
GOWN STRL REUS W/TWL LRG LVL3 (GOWN DISPOSABLE) ×2
GUIDEPIN VERSANAIL DSP 3.2X444 ×3 IMPLANT
GUIDEWIRE BALL NOSE 80CM (WIRE) ×3 IMPLANT
HEMOVAC 400CC 10FR (MISCELLANEOUS) IMPLANT
HFN LH 130 DEG 11MM X 340MM (Nail) ×3 IMPLANT
HIP FRAC NAIL LAG SCR 10.5X100 (Orthopedic Implant) ×2 IMPLANT
KIT RM TURNOVER CYSTO AR (KITS) ×3 IMPLANT
MAT BLUE FLOOR 46X72 FLO (MISCELLANEOUS) ×3 IMPLANT
NS IRRIG 1000ML POUR BTL (IV SOLUTION) ×3 IMPLANT
PACK HIP COMPR (MISCELLANEOUS) ×3 IMPLANT
SCREW BONE CORTICAL 5.0X38 (Screw) ×3 IMPLANT
SCREW BONE CORTICAL 5.0X42 (Screw) ×3 IMPLANT
SCREW CANN THRD AFF 10.5X100 (Orthopedic Implant) ×1 IMPLANT
STAPLER SKIN PROX 35W (STAPLE) ×3 IMPLANT
SUCTION FRAZIER HANDLE 10FR (MISCELLANEOUS) ×2
SUCTION TUBE FRAZIER 10FR DISP (MISCELLANEOUS) ×1 IMPLANT
SUT VIC AB 0 CT1 36 (SUTURE) ×6 IMPLANT
SUT VIC AB 2-0 CT1 27 (SUTURE) ×2
SUT VIC AB 2-0 CT1 TAPERPNT 27 (SUTURE) ×1 IMPLANT
SUT VICRYL 0 AB UR-6 (SUTURE) ×3 IMPLANT
SYR 30ML LL (SYRINGE) ×3 IMPLANT

## 2015-09-13 NOTE — Anesthesia Preprocedure Evaluation (Signed)
Anesthesia Evaluation  Patient identified by MRN, date of birth, ID band Patient awake    Reviewed: Allergy & Precautions, H&P , NPO status , Patient's Chart, lab work & pertinent test results, reviewed documented beta blocker date and time   Airway Mallampati: III  TM Distance: >3 FB Neck ROM: full    Dental  (+) Teeth Intact   Pulmonary neg pulmonary ROS,    Pulmonary exam normal        Cardiovascular Exercise Tolerance: Good hypertension, negative cardio ROS Normal cardiovascular exam Rate:Normal     Neuro/Psych negative neurological ROS  negative psych ROS   GI/Hepatic negative GI ROS, Neg liver ROS,   Endo/Other  negative endocrine ROSdiabetes  Renal/GU negative Renal ROS  negative genitourinary   Musculoskeletal   Abdominal   Peds  Hematology negative hematology ROS (+)   Anesthesia Other Findings   Reproductive/Obstetrics negative OB ROS                             Anesthesia Physical Anesthesia Plan  ASA: III and emergent  Anesthesia Plan: General LMA   Post-op Pain Management:    Induction:   Airway Management Planned:   Additional Equipment:   Intra-op Plan:   Post-operative Plan:   Informed Consent: I have reviewed the patients History and Physical, chart, labs and discussed the procedure including the risks, benefits and alternatives for the proposed anesthesia with the patient or authorized representative who has indicated his/her understanding and acceptance.     Plan Discussed with: CRNA  Anesthesia Plan Comments:         Anesthesia Quick Evaluation

## 2015-09-13 NOTE — Progress Notes (Signed)
Visit made. Patient seen, just back from surgery of left hip fracture repair. Daughter [name] present. Patient rousable but sleepy. Large bruise noted to the left side of her forehead. Per discussion with [name] the plan will be for patient to return to her room at [facility], no rehab planned. Will continue to follow and update hospice team. Flo Shanks RN, BSN, [facility] and [facility] [place], hospital Liaison [phone] c

## 2015-09-13 NOTE — ED Triage Notes (Signed)
Pt brought in via ems from [facility] with a fall.  Pt has hematoma to forehead.  Unsure loc.  Pt has pain in left hip with shortening and rotation.  Pt alert.

## 2015-09-13 NOTE — NC FL2 (Deleted)
[place] LEVEL OF CARE SCREENING TOOL     IDENTIFICATION  Patient Name: Amanda Stark Birthdate: 1923/01/16 Sex: female Admission Date (Current Location): 09/13/2015  [facility] and [place] Number:  Selena Lesser  ([id number]) Facility and Address:  [facility], [address], [place], [place] [address]      Provider Number: [id number]  Attending Physician Name and Address:  Fritzi Mandes, MD  Relative Name and Phone Number:       Current Level of Care: Hospital Recommended Level of Care: [place] Prior Approval Number:    Date Approved/Denied:   PASRR Number:  ( [id number] )  Discharge Plan: SNF    Current Diagnoses: Patient Active Problem List   Diagnosis Date Noted  . Hip fracture ([place]) 09/13/2015  . Closed right hip fracture ([place]) 05/03/2015  . Atrial fibrillation, rapid ([place]) 11/20/2014  . SVT (supraventricular tachycardia) ([place]) 08/02/2014  . Hyponatremia 08/02/2014    Orientation RESPIRATION BLADDER Height & Weight     Self, Time, Situation, Place  Normal Incontinent, Indwelling catheter Weight: 105 lb (47.6 kg) Height:  5\' 3"  (160 cm)  BEHAVIORAL SYMPTOMS/MOOD NEUROLOGICAL BOWEL NUTRITION STATUS   (none )  (none ) Continent Diet (NPO for surgery to be advanced )  AMBULATORY STATUS COMMUNICATION OF NEEDS Skin   Extensive Assist Verbally Normal, Surgical wounds                       Personal Care Assistance Level of Assistance  Bathing, Feeding, Dressing Bathing Assistance: Maximum assistance Feeding assistance: Limited assistance Dressing Assistance: Maximum assistance     Functional Limitations Info  Sight, Hearing, Speech Sight Info: Adequate Hearing Info: Adequate Speech Info: Adequate    SPECIAL CARE FACTORS FREQUENCY   (Follwed by A/C Hospice. )                    Contractures      Additional Factors Info  Code Status, Allergies, Psychotropic, Insulin Sliding Scale Code  Status Info:  (DNR ) Allergies Info:  (Ascriptin A-d Aspirin Buf(alhyd-mghyd-cacar), Aspirin, Codeine, Hydrocodone, Statins, Tramadol) Psychotropic Info:  (Ativan ) Insulin Sliding Scale Info:  (NovoLog Insulin Injections 3 times per day. )       Current Medications (09/13/2015):  This is the current hospital active medication list Current Facility-Administered Medications  Medication Dose Route Frequency Provider Last Rate Last Dose  . acetaminophen (TYLENOL) tablet 650 mg  650 mg Oral TID Harrie Foreman, MD   650 mg at 09/13/15 0836  . bisacodyl (DULCOLAX) suppository 10 mg  10 mg Rectal Daily PRN Harrie Foreman, MD      . calcium-vitamin D (OSCAL WITH D) 500-200 MG-UNIT per tablet 1 tablet  1 tablet Oral BID Harrie Foreman, MD      . demeclocycline (DECLOMYCIN) tablet 450 mg  450 mg Oral BID Harrie Foreman, MD   450 mg at 09/13/15 0840  . dextrose 5 %-0.9 % sodium chloride infusion   Intravenous Continuous Harrie Foreman, MD 100 mL/hr at 09/13/15 [phone]    . glucagon (GLUCAGEN) injection 1 mg  1 mg Intramuscular Once PRN Harrie Foreman, MD      . guaifenesin (ROBITUSSIN) 100 MG/5ML syrup 200 mg  200 mg Oral Q4H PRN Harrie Foreman, MD      . insulin aspart (novoLOG) injection 0-9 Units  0-9 Units Subcutaneous Q6H Harrie Foreman, MD      . insulin detemir (  LEVEMIR) injection 6 Units  6 Units Subcutaneous QHS Harrie Foreman, MD      . levothyroxine (SYNTHROID, LEVOTHROID) tablet 150 mcg  150 mcg Oral QAC breakfast Harrie Foreman, MD   150 mcg at 09/13/15 0835  . LORazepam (ATIVAN) tablet 1 mg  1 mg Oral QHS Harrie Foreman, MD      . meclizine (ANTIVERT) tablet 25 mg  25 mg Oral TID PRN Harrie Foreman, MD      . methocarbamol (ROBAXIN) tablet 500 mg  500 mg Oral Q6H PRN Thornton Park, MD       Or  . methocarbamol (ROBAXIN) 500 mg in dextrose 5 % 50 mL IVPB  500 mg Intravenous Q6H PRN Thornton Park, MD      . metoprolol (LOPRESSOR) tablet 50 mg  50 mg Oral  BID Harrie Foreman, MD   50 mg at 09/13/15 0836  . morphine 2 MG/ML injection 0.5 mg  0.5 mg Intravenous Q3H PRN Harrie Foreman, MD   0.5 mg at 09/13/15 0836  . ondansetron (ZOFRAN) tablet 4 mg  4 mg Oral Q6H PRN Harrie Foreman, MD       Or  . ondansetron [facility]) injection 4 mg  4 mg Intravenous Q6H PRN Harrie Foreman, MD   4 mg at 09/13/15 [phone]  . pantoprazole (PROTONIX) EC tablet 40 mg  40 mg Oral QAC breakfast Harrie Foreman, MD   40 mg at 09/13/15 0836  . polyethylene glycol (MIRALAX / GLYCOLAX) packet 17 g  17 g Oral Matthias Hughs, MD   17 g at 09/13/15 0835  . predniSONE (DELTASONE) tablet 7.5 mg  7.5 mg Oral Q breakfast Harrie Foreman, MD   7.5 mg at 09/13/15 0840  . senna-docusate (Senokot-S) tablet 1 tablet  1 tablet Oral BID Harrie Foreman, MD   1 tablet at 09/13/15 [phone]  . sotalol (BETAPACE) tablet 40 mg  40 mg Oral Q12H Harrie Foreman, MD   40 mg at 09/13/15 0841  . traMADol (ULTRAM) tablet 50 mg  50 mg Oral Q6H PRN Harrie Foreman, MD   50 mg at 09/13/15 [id number]     Discharge Medications: Please see discharge summary for a list of discharge medications.  Relevant Imaging Results:  Relevant Lab Results:   Additional Information  (SSN: [id number])  Zavion Sleight, Veronia Beets, LCSW

## 2015-09-13 NOTE — Consult Note (Signed)
ORTHOPAEDIC CONSULTATION  REQUESTING PHYSICIAN: Fritzi Mandes, MD  Chief Complaint: Left hip pain status post fall  HPI: Amanda Stark is a 82 y.o. female who complains of left hip pain status post fall.  Patient got up from bed without assistance, per her daughter, and fell on her left side.  She was brought to the [facility] emergency Department where she was diagnosed with a left intertrochanteric hip fracture. She had a right intertrochanteric hip fracture in April which was operated on by Dr. Roland Rack.   Past Medical History:  Diagnosis Date  . Diabetes mellitus without complication ([place])   . Glenohumeral arthritis   . Hyperlipidemia   . Hypertension   . Instability of right shoulder joint   . Lumbar spinal stenosis   . Osteoporosis   . PMR (polymyalgia rheumatica) ([facility])   . SVT (supraventricular tachycardia) ([place])   . Thyroid cancer ([place])   . Thyroid disease   . Vertigo    Past Surgical History:  Procedure Laterality Date  . ABDOMINAL HYSTERECTOMY    . APPENDECTOMY    . INTRAMEDULLARY (IM) NAIL INTERTROCHANTERIC Right 05/04/2015   Procedure: INTRAMEDULLARY (IM) NAIL INTERTROCHANTRIC;  Surgeon: Corky Mull, MD;  Location: [facility] ORS;  Service: Orthopedics;  Laterality: Right;  . kidney stone removal    . right lens implant    . THYROIDECTOMY    . TOTAL THYROIDECTOMY     Social History   Social History  . Marital status: Widowed    Spouse name: N/A  . Number of children: N/A  . Years of education: N/A   Social History Main Topics  . Smoking status: Never Smoker  . Smokeless tobacco: Never Used  . Alcohol use No  . Drug use: No  . Sexual activity: Not Currently   Other Topics Concern  . None   Social History Narrative  . None   Family History  Problem Relation Age of Onset  . Cancer Mother   . Cancer Sister    Allergies  Allergen Reactions  . Ascriptin A-D [Aspirin Buf(Alhyd-Mghyd-Cacar)] Other (See Comments)    Reaction:  Unknown   . Aspirin  Other (See Comments)    Reaction:  Unknown   . Codeine Other (See Comments)    Reaction:  Unknown   . Hydrocodone Other (See Comments)    Reaction:  Unknown   . Statins Other (See Comments)    Reaction:  Muscle pain    . Tramadol Nausea And Vomiting   Prior to Admission medications   Medication Sig Start Date End Date Taking? Authorizing Provider  acetaminophen (TYLENOL) 325 MG tablet Take 650 mg by mouth 3 (three) times daily.   Yes Historical Provider, MD  bisacodyl (DULCOLAX) 10 MG suppository Place 1 suppository (10 mg total) rectally daily as needed for moderate constipation. 05/08/15  Yes Dustin Flock, MD  Calcium Carbonate-Vitamin D (CALCIUM 600+D) 600-400 MG-UNIT tablet Take 1 tablet by mouth 2 (two) times daily.   Yes Historical Provider, MD  demeclocycline (DECLOMYCIN) 300 MG tablet Take 450 mg by mouth 2 (two) times daily.   Yes Historical Provider, MD  glucagon (GLUCAGEN) 1 MG SOLR injection Inject 1 mg into the muscle once as needed for low blood sugar.   Yes Historical Provider, MD  guaifenesin (ROBITUSSIN) 100 MG/5ML syrup Take 200 mg by mouth every 4 (four) hours as needed for cough.    Yes Historical Provider, MD  insulin detemir (LEVEMIR) 100 UNIT/ML injection Inject 0.03 mLs (3 Units total) into the  skin 2 (two) times daily. Pt uses 10 units in the morning and 4 units at bedtime. Patient taking differently: Inject 5-18 Units into the skin 2 (two) times daily. Pt uses 18 units in the morning and 5 units at bedtime. 05/08/15  Yes Dustin Flock, MD  levothyroxine (SYNTHROID, LEVOTHROID) 150 MCG tablet Take 150 mcg by mouth daily before breakfast.   Yes Historical Provider, MD  lidocaine (LIDODERM) 5 % Place 1 patch onto the skin daily.    Yes Historical Provider, MD  LORazepam (ATIVAN) 1 MG tablet Take 1 tablet (1 mg total) by mouth at bedtime. 05/08/15  Yes Dustin Flock, MD  meclizine (ANTIVERT) 25 MG tablet Take 25 mg by mouth 3 (three) times daily as needed for nausea.    Yes Historical Provider, MD  metoprolol (LOPRESSOR) 50 MG tablet Take 1 tablet (50 mg total) by mouth 2 (two) times daily. 11/22/14  Yes Rusty Aus, MD  omeprazole (PRILOSEC) 20 MG capsule Take 20 mg by mouth daily.   Yes Historical Provider, MD  polyethylene glycol (MIRALAX / GLYCOLAX) packet Take 17 g by mouth every other day.    Yes Historical Provider, MD  predniSONE (DELTASONE) 5 MG tablet Take 7.5 mg by mouth daily with breakfast.    Yes Historical Provider, MD  senna-docusate (SENOKOT-S) 8.6-50 MG tablet Take 1 tablet by mouth 2 (two) times daily.   Yes Historical Provider, MD  sotalol (BETAPACE) 80 MG tablet Take 0.5 tablets (40 mg total) by mouth every 12 (twelve) hours. 11/22/14  Yes Rusty Aus, MD  traMADol (ULTRAM) 50 MG tablet Take by mouth 3 (three) times daily.   Yes Historical Provider, MD  amLODipine (NORVASC) 5 MG tablet Take 5 mg by mouth daily.    Historical Provider, MD  docusate sodium (COLACE) 100 MG capsule Take 1 capsule (100 mg total) by mouth 2 (two) times daily. Patient not taking: Reported on 09/13/2015 05/08/15   Dustin Flock, MD  enoxaparin (LOVENOX) 40 MG/0.4ML injection Inject 0.4 mLs (40 mg total) into the skin daily. 05/08/15 05/29/15  Dustin Flock, MD  oxyCODONE (OXY IR/ROXICODONE) 5 MG immediate release tablet Take 1-2 tablets (5-10 mg total) by mouth every 3 (three) hours as needed for breakthrough pain. Patient not taking: Reported on 09/13/2015 05/08/15   Dustin Flock, MD   Dg Chest 1 View  Result Date: 09/13/2015 CLINICAL DATA:  Preoperative chest x-ray.  Left hip fracture. EXAM: CHEST 1 VIEW COMPARISON:  05/07/2015 FINDINGS: Normal heart size and pulmonary vascularity. Emphysematous changes in the lungs. Linear fibrosis in the right lung base. Esophageal hiatal hernia behind the heart. Calcified and tortuous aorta. No focal consolidation. No blunting of costophrenic angles. No pneumothorax. Old right rib fractures. Degenerative changes and scoliosis of  the thoracolumbar spine. Degenerative changes in the shoulders. IMPRESSION: No evidence of active pulmonary disease. Emphysematous changes and fibrosis in the lungs. Esophageal hiatal hernia behind the heart. Electronically Signed   By: Lucienne Capers M.D.   On: 09/13/2015 03:30   Ct Head Wo Contrast  Result Date: 09/13/2015 CLINICAL DATA:  Status post fall, with forehead hematoma. Concern for cervical spine injury. Initial encounter. EXAM: CT HEAD WITHOUT CONTRAST CT CERVICAL SPINE WITHOUT CONTRAST TECHNIQUE: Multidetector CT imaging of the head and cervical spine was performed following the standard protocol without intravenous contrast. Multiplanar CT image reconstructions of the cervical spine were also generated. COMPARISON:  CT of the head performed 09/14/2014, and CT of the cervical spine performed 08/02/2014 FINDINGS: CT HEAD FINDINGS  There is no evidence of acute infarction, mass lesion, or intra- or extra-axial hemorrhage on CT. Prominence of the ventricles and sulci reflects mild to moderate cortical volume loss. Scattered periventricular and subcortical white matter change likely reflects small vessel ischemic microangiopathy. A small chronic infarct is seen at the left cerebellar hemisphere. The brainstem and fourth ventricle are within normal limits. The basal ganglia are unremarkable in appearance. The cerebral hemispheres demonstrate grossly normal gray-white differentiation. No mass effect or midline shift is seen. There is no evidence of fracture; there is minimal nonspecific heterogeneity of visualized osseous structures. The visualized portions of the orbits are within normal limits. The paranasal sinuses and mastoid air cells are well-aerated. Soft tissue swelling is noted overlying the frontal calvarium. CT CERVICAL SPINE FINDINGS There is no evidence of fracture or subluxation. Vertebral bodies demonstrate normal height and alignment. Intervertebral disc space narrowing is noted at  C5-C6, with a small anterior disc osteophyte complex. Prevertebral soft tissues are within normal limits. The visualized neural foramina are grossly unremarkable. The thyroid gland is unremarkable in appearance. The visualized lung apices are clear. Calcification is noted at the carotid bifurcations bilaterally, more prominent on the left. There is likely mild to moderate luminal narrowing at the proximal left internal carotid artery. IMPRESSION: 1. No evidence of traumatic intracranial injury or fracture. 2. No evidence of fracture or subluxation along the cervical spine. 3. Soft tissue swelling overlying the frontal calvarium. 4. Mild to moderate cortical volume loss and scattered small vessel ischemic microangiopathy. 5. 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 6. Calcification at the carotid bifurcations bilaterally, more prominent on the left. There is likely mild to moderate luminal narrowing at the proximal left internal carotid artery. Carotid ultrasound would be helpful for further evaluation, when and as deemed clinically appropriate. Electronically Signed   By: Garald Balding M.D.   On: 09/13/2015 04:01   Ct Cervical Spine Wo Contrast  Result Date: 09/13/2015 CLINICAL DATA:  Status post fall, with forehead hematoma. Concern for cervical spine injury. Initial encounter. EXAM: CT HEAD WITHOUT CONTRAST CT CERVICAL SPINE WITHOUT CONTRAST TECHNIQUE: Multidetector CT imaging of the head and cervical spine was performed following the standard protocol without intravenous contrast. Multiplanar CT image reconstructions of the cervical spine were also generated. COMPARISON:  CT of the head performed 09/14/2014, and CT of the cervical spine performed 08/02/2014 FINDINGS: CT HEAD FINDINGS There is no evidence of acute infarction, mass lesion, or intra- or extra-axial hemorrhage on CT. Prominence of the ventricles and sulci reflects mild to moderate cortical volume loss. Scattered periventricular  and subcortical white matter change likely reflects small vessel ischemic microangiopathy. A small chronic infarct is seen at the left cerebellar hemisphere. The brainstem and fourth ventricle are within normal limits. The basal ganglia are unremarkable in appearance. The cerebral hemispheres demonstrate grossly normal gray-white differentiation. No mass effect or midline shift is seen. There is no evidence of fracture; there is minimal nonspecific heterogeneity of visualized osseous structures. The visualized portions of the orbits are within normal limits. The paranasal sinuses and mastoid air cells are well-aerated. Soft tissue swelling is noted overlying the frontal calvarium. CT CERVICAL SPINE FINDINGS There is no evidence of fracture or subluxation. Vertebral bodies demonstrate normal height and alignment. Intervertebral disc space narrowing is noted at C5-C6, with a small anterior disc osteophyte complex. Prevertebral soft tissues are within normal limits. The visualized neural foramina are grossly unremarkable. The thyroid gland is unremarkable in appearance. The visualized lung apices are  clear. Calcification is noted at the carotid bifurcations bilaterally, more prominent on the left. There is likely mild to moderate luminal narrowing at the proximal left internal carotid artery. IMPRESSION: 1. No evidence of traumatic intracranial injury or fracture. 2. No evidence of fracture or subluxation along the cervical spine. 3. Soft tissue swelling overlying the frontal calvarium. 4. Mild to moderate cortical volume loss and scattered small vessel ischemic microangiopathy. 5. 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 6. Calcification at the carotid bifurcations bilaterally, more prominent on the left. There is likely mild to moderate luminal narrowing at the proximal left internal carotid artery. Carotid ultrasound would be helpful for further evaluation, when and as deemed clinically appropriate.  Electronically Signed   By: Garald Balding M.D.   On: 09/13/2015 04:01   Dg Shoulder Left  Result Date: 09/13/2015 CLINICAL DATA:  Left shoulder pain and limited range of motion after a fall. EXAM: LEFT SHOULDER - 2+ VIEW COMPARISON:  None. FINDINGS: Degenerative changes in the acromioclavicular and glenohumeral joints. Diffuse bone demineralization. No evidence of acute fracture or dislocation in the left humerus. Calcification along greater tuberosity may indicate calcific tendinitis. IMPRESSION: Degenerative changes in the left shoulder. No acute fractures. Soft tissue calcifications suggesting calcific tendinitis. Electronically Signed   By: Lucienne Capers M.D.   On: 09/13/2015 03:33   Dg Hip Unilat With Pelvis 2-3 Views Left  Result Date: 09/13/2015 CLINICAL DATA:  Fall with hematoma to the forehead. Left hip pain with shortening and rotation EXAM: DG HIP (WITH OR WITHOUT PELVIS) 2-3V LEFT COMPARISON:  None. FINDINGS: Acute inter trochanteric fracture of the left hip with varus angulation of the fracture fragments. No dislocation at the hip joint. Pelvis appears intact. Degenerative changes in the lower lumbar spine and in both hips. Previous intra medullary rod fixation of the right hip. Vascular calcifications. IMPRESSION: Acute comminuted fracture of the inter trochanteric left hip with varus angulation. Electronically Signed   By: Lucienne Capers M.D.   On: 09/13/2015 03:31    Positive ROS: All other systems have been reviewed and were otherwise negative with the exception of those mentioned in the HPI and as above.  Physical Exam: General: Alert, no acute distress  MUSCULOSKELETAL: Left lower extremity: Patient's left lower extremity is shortened and externally rotated. Her skin overlying the left hip is intact. There is no erythema or ecchymosis or significant swelling around the left hip. Patient does have ecchymotic lesions in the left lower extremity with a lateral skin tear which is  covered with a bandage. She has palpable pedal pulses, intact sensation to light touch in left foot and can flex and extend her toes and dorsiflex and plantarflex her ankle actively and spontaneously.  Assessment: Left intertrochanteric hip fracture, displaced  Plan: I explained to the patient's family today that she has sustained a fracture very similar to the one she had on the right side. I'm recommending intramedullary fixation like she had for the right side back in April. I reviewed again with the family the risks and benefits of surgery. They understand the risks include infection, bleeding requiring blood transfusion, nerve or blood vessel injury, malunion, nonunion, persistent right hip pain and failure of the hardware or cut out of the lag screw. Medical risks include but are not limited to DVT and pulmonary embolism, myocardial infarction, stroke, pneumonia, respiratory failure and death. They understand that based on her age she is at moderate to high risk for surgery based on her age. They understand  these risks and that the patient is at moderate high risk for surgery. They are in agreement with the plan for surgery. Patient's left hip was marked within the surgical field by me. I have answered all questions by the family. Patient is cleared for surgery by the medical service. Surgery is planned for later today.    [place], MD    09/13/2015 10:13 AM

## 2015-09-13 NOTE — Progress Notes (Signed)
Patient is currently followed at [facility] by [facility] with a hospice diagnosis of Alzheimer's disease. She is a DNR code. Patient was sent to the [facility] ED for evaluation after fall. Per chart note review she suffered a left hip fracture and is currently in surgery for repair. Hospice team updated. Hospital care team aware of Hospice following. Flo Shanks RN, BSN, [facility], hospital Liaison [phone] c

## 2015-09-13 NOTE — Progress Notes (Signed)
Pt seen and examined. Came in after mechanical fall at [facility]. Has left intertrochanteric hip fracture. Recently had right hip fracture nad underwent surgery in April 2017 D/w daughter D/w Dr Raliegh Ip. Pt will be going for surgery around 1 pm today

## 2015-09-13 NOTE — Progress Notes (Signed)
Applied ice pack to left hip and applied avi boots

## 2015-09-13 NOTE — ED Provider Notes (Signed)
[facility] Emergency Department Provider Note    ____________________________________________   I have reviewed the triage vital signs and the nursing notes.   HISTORY  Chief Complaint Fall and Hip Pain   History limited by: Dementia, some history obtained from family   HPI Amanda Stark is a 82 y.o. female who presents to the emergency department today because of concerns for left hip pain. The patient had an unwitnessed fall tonight. Per family patient has poor ambulation and typically walks. She apparently tried to get up out of her wheelchair to the bathroom. The patient is only complaining of pain in that left hip. No recent fevers or illnesses per family.   Past Medical History:  Diagnosis Date  . Diabetes mellitus without complication ([place])   . Esophageal cancer ([place])   . Glenohumeral arthritis   . Hyperlipidemia   . Hypertension   . Instability of right shoulder joint   . Lumbar spinal stenosis   . Osteoporosis   . PMR (polymyalgia rheumatica) (HCC)   . SVT (supraventricular tachycardia) ([place])   . Thyroid cancer ([place])   . Thyroid disease   . Vertigo     Patient Active Problem List   Diagnosis Date Noted  . Closed right hip fracture ([place]) 05/03/2015  . Atrial fibrillation, rapid ([place]) 11/20/2014  . SVT (supraventricular tachycardia) ([place]) 08/02/2014  . Hyponatremia 08/02/2014    Past Surgical History:  Procedure Laterality Date  . ABDOMINAL HYSTERECTOMY    . APPENDECTOMY    . BACK SURGERY    . INTRAMEDULLARY (IM) NAIL INTERTROCHANTERIC Right 05/04/2015   Procedure: INTRAMEDULLARY (IM) NAIL INTERTROCHANTRIC;  Surgeon: Corky Mull, MD;  Location: [facility] ORS;  Service: Orthopedics;  Laterality: Right;  . kidney stone removal    . right lens implant    . THYROIDECTOMY    . TOTAL THYROIDECTOMY      Prior to Admission medications   Medication Sig Start Date End Date Taking? Authorizing Provider  acetaminophen (TYLENOL) 325 MG  tablet Take 650 mg by mouth 3 (three) times daily.   Yes Historical Provider, MD  bisacodyl (DULCOLAX) 10 MG suppository Place 1 suppository (10 mg total) rectally daily as needed for moderate constipation. 05/08/15  Yes Dustin Flock, MD  Calcium Carbonate-Vitamin D (CALCIUM 600+D) 600-400 MG-UNIT tablet Take 1 tablet by mouth 2 (two) times daily.   Yes Historical Provider, MD  demeclocycline (DECLOMYCIN) 300 MG tablet Take 450 mg by mouth 2 (two) times daily.   Yes Historical Provider, MD  glucagon (GLUCAGEN) 1 MG SOLR injection Inject 1 mg into the muscle once as needed for low blood sugar.   Yes Historical Provider, MD  guaifenesin (ROBITUSSIN) 100 MG/5ML syrup Take 200 mg by mouth every 4 (four) hours as needed for cough.    Yes Historical Provider, MD  insulin detemir (LEVEMIR) 100 UNIT/ML injection Inject 0.03 mLs (3 Units total) into the skin 2 (two) times daily. Pt uses 10 units in the morning and 4 units at bedtime. Patient taking differently: Inject 5-18 Units into the skin 2 (two) times daily. Pt uses 18 units in the morning and 5 units at bedtime. 05/08/15  Yes Dustin Flock, MD  levothyroxine (SYNTHROID, LEVOTHROID) 150 MCG tablet Take 150 mcg by mouth daily before breakfast.   Yes Historical Provider, MD  lidocaine (LIDODERM) 5 % Place 1 patch onto the skin daily.    Yes Historical Provider, MD  LORazepam (ATIVAN) 1 MG tablet Take 1 tablet (1 mg total) by mouth  at bedtime. 05/08/15  Yes Dustin Flock, MD  meclizine (ANTIVERT) 25 MG tablet Take 25 mg by mouth 3 (three) times daily as needed for nausea.   Yes Historical Provider, MD  metoprolol (LOPRESSOR) 50 MG tablet Take 1 tablet (50 mg total) by mouth 2 (two) times daily. 11/22/14  Yes Rusty Aus, MD  omeprazole (PRILOSEC) 20 MG capsule Take 20 mg by mouth daily.   Yes Historical Provider, MD  polyethylene glycol (MIRALAX / GLYCOLAX) packet Take 17 g by mouth every other day.    Yes Historical Provider, MD  predniSONE (DELTASONE) 5  MG tablet Take 7.5 mg by mouth daily with breakfast.    Yes Historical Provider, MD  senna-docusate (SENOKOT-S) 8.6-50 MG tablet Take 1 tablet by mouth 2 (two) times daily.   Yes Historical Provider, MD  sotalol (BETAPACE) 80 MG tablet Take 0.5 tablets (40 mg total) by mouth every 12 (twelve) hours. 11/22/14  Yes Rusty Aus, MD  traMADol (ULTRAM) 50 MG tablet Take by mouth 3 (three) times daily.   Yes Historical Provider, MD  amLODipine (NORVASC) 5 MG tablet Take 5 mg by mouth daily.    Historical Provider, MD  docusate sodium (COLACE) 100 MG capsule Take 1 capsule (100 mg total) by mouth 2 (two) times daily. Patient not taking: Reported on 09/13/2015 05/08/15   Dustin Flock, MD  enoxaparin (LOVENOX) 40 MG/0.4ML injection Inject 0.4 mLs (40 mg total) into the skin daily. 05/08/15 05/29/15  Dustin Flock, MD  oxyCODONE (OXY IR/ROXICODONE) 5 MG immediate release tablet Take 1-2 tablets (5-10 mg total) by mouth every 3 (three) hours as needed for breakthrough pain. Patient not taking: Reported on 09/13/2015 05/08/15   Dustin Flock, MD    Allergies Ascriptin a-d [aspirin buf(alhyd-mghyd-cacar)]; Aspirin; Codeine; Hydrocodone; Statins; and Tramadol  Family History  Problem Relation Age of Onset  . Cancer Mother   . Cancer Sister     Social History Social History  Substance Use Topics  . Smoking status: Never Smoker  . Smokeless tobacco: Never Used  . Alcohol use No    Review of Systems  Constitutional: Negative for fever. Cardiovascular: Negative for chest pain. Respiratory: Negative for shortness of breath. Gastrointestinal: Negative for abdominal pain, vomiting and diarrhea. Genitourinary: Negative for dysuria. Musculoskeletal: Positive for left hip pain. Skin: Negative for rash. Neurological: Negative for headaches, focal weakness or numbness.  10-point ROS otherwise negative.  ____________________________________________   PHYSICAL EXAM:  VITAL SIGNS: ED Triage Vitals   Enc Vitals Group     BP 09/13/15 0235 (!) 138/96     Pulse Rate 09/13/15 0235 (!) 56     Resp 09/13/15 0235 20     Temp 09/13/15 0235 98.3 F (36.8 C)     Temp Source 09/13/15 0235 Oral     SpO2 09/13/15 0235 98 %     Weight 09/13/15 0236 105 lb (47.6 kg)     Height 09/13/15 0236 5\' 3"  (1.6 m)     Head Circumference --      Peak Flow --      Pain Score 09/13/15 0236 9   Constitutional: Awake and alert. No acute distress. Eyes: Conjunctivae are normal. PERRL. Normal extraocular movements. ENT   Head: Normocephalic and atraumatic.   Nose: No congestion/rhinnorhea.   Mouth/Throat: Mucous membranes are moist.   Neck: No stridor. Hematological/Lymphatic/Immunilogical: No cervical lymphadenopathy. Cardiovascular: Normal rate, regular rhythm.  No murmurs, rubs, or gallops. Respiratory: Normal respiratory effort without tachypnea nor retractions. Breath sounds are clear  and equal bilaterally. No wheezes/rales/rhonchi. Gastrointestinal: Soft and nontender. No distention. Genitourinary: Deferred Musculoskeletal: Left leg externally rotated and shortened. Tender to palpation and manipulation of the left hip. DP 2+. Neurologic:  Awake and alert. Not oriented to events.  Skin:  Skin is warm, dry and intact. No rash noted. Psychiatric: Mood and affect are normal. Speech and behavior are normal. Patient exhibits appropriate insight and judgment.  ____________________________________________    LABS (pertinent positives/negatives)  Labs Reviewed  CBC WITH DIFFERENTIAL/PLATELET - Abnormal; Notable for the following:       Result Value   WBC 12.1 (*)    Hemoglobin 11.1 (*)    HCT 34.5 (*)    Neutro Abs 7.5 (*)    Lymphs Abs 3.7 (*)    All other components within normal limits  BASIC METABOLIC PANEL - Abnormal; Notable for the following:    Sodium 130 (*)    Chloride 93 (*)    CO2 33 (*)    BUN 37 (*)    Anion gap 4 (*)    All other components within normal limits   TROPONIN I  URINALYSIS COMPLETEWITH MICROSCOPIC ([facility] ONLY)     ____________________________________________   EKG  I, Nance Pear, attending physician, personally viewed and interpreted this EKG  EKG Time: 0232 Rate: 56 Rhythm: normal sinus rhythm Axis: left axis deviation Intervals: qtc 431 QRS: narrow, q waves V1, V2 ST changes: no st elevation Impression: abnormal ekg   ____________________________________________    RADIOLOGY  CT head/cervical spine IMPRESSION: 1. No evidence of traumatic intracranial injury or fracture. 2. No evidence of fracture or subluxation along the cervical spine. 3. Soft tissue swelling overlying the frontal calvarium. 4. Mild to moderate cortical volume loss and scattered small vessel ischemic microangiopathy. 5. 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 6. Calcification at the carotid bifurcations bilaterally, more prominent on the left. There is likely mild to moderate luminal narrowing at the proximal left internal carotid artery. Carotid ultrasound would be helpful for further evaluation, when and as deemed clinically appropriate.  CXR IMPRESSION: No evidence of active pulmonary disease. Emphysematous changes and fibrosis in the lungs. Esophageal hiatal hernia behind the heart.  Left hip IMPRESSION: Acute comminuted fracture of the inter trochanteric left hip with varus angulation.  Left shoulder IMPRESSION: Degenerative changes in the left shoulder. No acute fractures. Soft tissue calcifications suggesting calcific tendinitis.   ____________________________________________   PROCEDURES  Procedures  ____________________________________________   INITIAL IMPRESSION / ASSESSMENT AND PLAN / ED COURSE  Pertinent labs & imaging results that were available during my care of the patient were reviewed by me and considered in my medical decision making (see chart for details).  Patient presented to  the emergency department today after a fall likely related to her trying to walk on her own. Left hip clinically appeared to be fractured and x-ray did confirm this. CT of the head and cervical spine were negative. Will plan on admission to the hospital service and will contact orthopedics. ____________________________________________   FINAL CLINICAL IMPRESSION(S) / ED DIAGNOSES  Final diagnoses:  Fall  Fall, initial encounter  Hip fracture, left, closed, initial encounter [facility])     Note: This dictation was prepared with Diplomatic Services operational officer dictation. Any transcriptional errors that result from this process are unintentional    Nance Pear, MD 09/13/15 [phone]

## 2015-09-13 NOTE — Progress Notes (Signed)
MD ordered enoxaparin 40 mg subcutaneously every 24 hours beginning tomorrow morning. CrCl > 30 mL/min.  Darylene Price [facility] 09/13/15 7:20 PM

## 2015-09-13 NOTE — ED Notes (Signed)
Brought patient and family warm blankets, brought family coffee. Everyone denies concerns or needs as of now and were reassured the ED staff will update when results are available.

## 2015-09-13 NOTE — Clinical Social Work Note (Signed)
Clinical Social Work Assessment  Patient Details  Name: MERYEM HAERTEL MRN: [id number] Date of Birth: [birth year]  Date of referral:  09/13/15               Reason for consult:  Facility Placement, Other (Comment Required) (From [place] followed by [facility]. )                Permission sought to share information with:  Chartered certified accountant granted to share information::  Yes, Verbal Permission Granted  Name::      Geophysicist/field seismologist::   [address]   Relationship::     Contact Information:     Housing/Transportation Living arrangements for the past 2 months:  [place] of Information:  Adult Children, Facility Patient Interpreter Needed:  None Criminal Activity/Legal Involvement Pertinent to Current Situation/Hospitalization:  No - Comment as needed Significant Relationships:  Adult Children Lives with:  Facility Resident Do you feel safe going back to the place where you live?    Need for family participation in patient care:  Yes (Comment)  Care giving concerns: Patient is a long term care resident at [facility] followed by [place]/ [facility].    Social Worker assessment / plan:  Holiday representative (CSW) reviewed chart and noted that patient is from [place] and has surgery today for a hip fracture. Per [facility] liaison patient is a current [facility]. Per Kim admissions coordinator at [facility] patient is a long term care resident and can return when stable. CSW met with patient's daughter [name] and son [name] in the Family Room at [facility]. Per adult children patient use to live at [facility] then moved to [facility] for long term care. Daughter reported that patient recently got approved for long term care Medicaid. Son and daughter are in agreement with patient returning to [place]. Son and daughter are not interested in PT or rehab  and want to focus on making their mother comfortable.   FL2 complete and faxed out. CSW will continue to follow and assist as needed.    Employment status:  Retired Nurse, adult, Medicaid In [place] PT Recommendations:  Not assessed at this time Information / Referral to community resources:  [place]  Patient/Family's Response to care:  Patient's son and daughter are agreeable for patient to return to [place] and continue hospice services.   Patient/Family's Understanding of and Emotional Response to Diagnosis, Current Treatment, and Prognosis:  Patient's children were very pleasant and thanked CSW for visit. Adult children are realistic and want to focus on patient's comfort.    Emotional Assessment Appearance:    Attitude/Demeanor/Rapport:  Unable to Assess Affect (typically observed):  Unable to Assess Orientation:  Oriented to Self, Fluctuating Orientation (Suspected and/or reported [place]) Alcohol / Substance use:  Not Applicable Psych involvement (Current and /or in the community):  No (Comment)  Discharge Needs  Concerns to be addressed:  Discharge Planning Concerns Readmission within the last 30 days:  No Current discharge risk:  Chronically ill, Dependent with Mobility Barriers to Discharge:  Continued Medical Work up   [employer], Veronia Beets, LCSW 09/13/2015, 3:21 PM

## 2015-09-13 NOTE — Care Management (Signed)
Patient presents from long term care at [facility] after sustaining a witnessed fall.  She  has extensive  bruising left side of face and a hip fracture.  Patient is followed by [facility] for malnutrition.  She is a DNR. Family is wishing to pursue surgical intervention.  CSW is involved.

## 2015-09-13 NOTE — Anesthesia Procedure Notes (Signed)
Procedure Name: Intubation Date/Time: 09/13/2015 1:32 PM Performed by: Aline Brochure Pre-anesthesia Checklist: Patient identified, Emergency Drugs available, Suction available and Patient being monitored Patient Re-evaluated:Patient Re-evaluated prior to inductionOxygen Delivery Method: Circle system utilized Preoxygenation: Pre-oxygenation with 100% oxygen Intubation Type: IV induction, Rapid sequence and Cricoid Pressure applied Laryngoscope Size: Mac and 3 Grade View: Grade I Tube type: Oral Tube size: 7.0 mm Number of attempts: 1 Airway Equipment and Method: Stylet Secured at: 21 cm Tube secured with: Tape Dental Injury: Teeth and Oropharynx as per pre-operative assessment

## 2015-09-13 NOTE — NC FL2 (Signed)
[place] LEVEL OF CARE SCREENING TOOL     IDENTIFICATION  Patient Name: Amanda Stark Birthdate: Oct 15, 1922 Sex: female Admission Date (Current Location): 09/13/2015  [facility] and [place] Number:  Selena Lesser  ([id number]) Facility and Address:  [facility], [address], [place], [place] [address]      Provider Number: [id number]  Attending Physician Name and Address:  Fritzi Mandes, MD  Relative Name and Phone Number:       Current Level of Care: Hospital Recommended Level of Care: [place] Prior Approval Number:    Date Approved/Denied:   PASRR Number:  ( [id number] )  Discharge Plan: SNF    Current Diagnoses: Patient Active Problem List   Diagnosis Date Noted  . Hip fracture ([place]) 09/13/2015  . Closed right hip fracture ([place]) 05/03/2015  . Atrial fibrillation, rapid ([place]) 11/20/2014  . SVT (supraventricular tachycardia) ([place]) 08/02/2014  . Hyponatremia 08/02/2014    Orientation RESPIRATION BLADDER Height & Weight     Self, Time, Situation, Place  Normal Incontinent, Indwelling catheter Weight: 105 lb (47.6 kg) Height:  5\' 3"  (160 cm)  BEHAVIORAL SYMPTOMS/MOOD NEUROLOGICAL BOWEL NUTRITION STATUS   (none )  (none ) Continent Diet (NPO for surgery to be advanced )  AMBULATORY STATUS COMMUNICATION OF NEEDS Skin   Extensive Assist Verbally Normal, Surgical wounds                       Personal Care Assistance Level of Assistance  Bathing, Feeding, Dressing Bathing Assistance: Maximum assistance Feeding assistance: Limited assistance Dressing Assistance: Maximum assistance     Functional Limitations Info  Sight, Hearing, Speech Sight Info: Adequate Hearing Info: Adequate Speech Info: Adequate    SPECIAL CARE FACTORS FREQUENCY   (Follwed by [facility] Hospice. )                    Contractures      Additional Factors Info  Code Status, Allergies, Psychotropic, Insulin Sliding Scale Code  Status Info:  (DNR ) Allergies Info:  (Ascriptin A-d Aspirin Buf(alhyd-mghyd-cacar), Aspirin, Codeine, Hydrocodone, Statins, Tramadol) Psychotropic Info:  (Ativan ) Insulin Sliding Scale Info:  (NovoLog Insulin Injections 3 times per day. )       Current Medications (09/13/2015):  This is the current hospital active medication list Current Facility-Administered Medications  Medication Dose Route Frequency Provider Last Rate Last Dose  . acetaminophen (TYLENOL) tablet 650 mg  650 mg Oral TID Harrie Foreman, MD   650 mg at 09/13/15 0836  . bisacodyl (DULCOLAX) suppository 10 mg  10 mg Rectal Daily PRN Harrie Foreman, MD      . calcium-vitamin D (OSCAL WITH D) 500-200 MG-UNIT per tablet 1 tablet  1 tablet Oral BID Harrie Foreman, MD      . demeclocycline (DECLOMYCIN) tablet 450 mg  450 mg Oral BID Harrie Foreman, MD   450 mg at 09/13/15 0840  . dextrose 5 %-0.9 % sodium chloride infusion   Intravenous Continuous Harrie Foreman, MD 100 mL/hr at 09/13/15 [phone]    . glucagon (GLUCAGEN) injection 1 mg  1 mg Intramuscular Once PRN Harrie Foreman, MD      . guaifenesin (ROBITUSSIN) 100 MG/5ML syrup 200 mg  200 mg Oral Q4H PRN Harrie Foreman, MD      . insulin aspart (novoLOG) injection 0-9 Units  0-9 Units Subcutaneous Q6H Harrie Foreman, MD      . insulin detemir (  LEVEMIR) injection 6 Units  6 Units Subcutaneous QHS Harrie Foreman, MD      . levothyroxine (SYNTHROID, LEVOTHROID) tablet 150 mcg  150 mcg Oral QAC breakfast Harrie Foreman, MD   150 mcg at 09/13/15 0835  . LORazepam (ATIVAN) tablet 1 mg  1 mg Oral QHS Harrie Foreman, MD      . meclizine (ANTIVERT) tablet 25 mg  25 mg Oral TID PRN Harrie Foreman, MD      . methocarbamol (ROBAXIN) tablet 500 mg  500 mg Oral Q6H PRN [place], MD       Or  . methocarbamol (ROBAXIN) 500 mg in dextrose 5 % 50 mL IVPB  500 mg Intravenous Q6H PRN [place], MD      . metoprolol (LOPRESSOR) tablet 50 mg  50 mg Oral  BID Harrie Foreman, MD   50 mg at 09/13/15 0836  . morphine 2 MG/ML injection 0.5 mg  0.5 mg Intravenous Q3H PRN Harrie Foreman, MD   0.5 mg at 09/13/15 0836  . ondansetron (ZOFRAN) tablet 4 mg  4 mg Oral Q6H PRN Harrie Foreman, MD       Or  . ondansetron [facility]) injection 4 mg  4 mg Intravenous Q6H PRN Harrie Foreman, MD   4 mg at 09/13/15 [phone]  . pantoprazole (PROTONIX) EC tablet 40 mg  40 mg Oral QAC breakfast Harrie Foreman, MD   40 mg at 09/13/15 0836  . polyethylene glycol (MIRALAX / GLYCOLAX) packet 17 g  17 g Oral Matthias Hughs, MD   17 g at 09/13/15 0835  . predniSONE (DELTASONE) tablet 7.5 mg  7.5 mg Oral Q breakfast Harrie Foreman, MD   7.5 mg at 09/13/15 0840  . senna-docusate (Senokot-S) tablet 1 tablet  1 tablet Oral BID Harrie Foreman, MD   1 tablet at 09/13/15 [phone]  . sotalol (BETAPACE) tablet 40 mg  40 mg Oral Q12H Harrie Foreman, MD   40 mg at 09/13/15 0841  . traMADol (ULTRAM) tablet 50 mg  50 mg Oral Q6H PRN Harrie Foreman, MD   50 mg at 09/13/15 [id number]     Discharge Medications: Please see discharge summary for a list of discharge medications.  Relevant Imaging Results:  Relevant Lab Results:   Additional Information  (SSN: [id number])  Manasa Spease, Veronia Beets, LCSW

## 2015-09-13 NOTE — Progress Notes (Signed)
Subjective:  POST-OP CHECK:  Patient reports left hip pain as minimal.  Patient has no complaints postop. Her brother and sister-in-law at the bedside along with her nurse.  Objective:   VITALS:   Vitals:   09/13/15 1637 09/13/15 1700 09/13/15 1735 09/13/15 1735  BP: (!) 134/59 (!) 164/57 (!) 164/57 (!) 159/67  Pulse: 66 65  61  Resp: (!) 9   16  Temp:  97.5 F (36.4 C)  (!) 96.6 F (35.9 C)  TempSrc:  Oral  Axillary  SpO2: 98% 95%  94%  Weight:      Height:        PHYSICAL EXAM:  Left lower extremity: Patient has intact sensation light touch and palpable pedal pulses.  She can flex and extend her toes and dorsiflex and plantarflex her ankle.   LABS  Results for orders placed or performed during the hospital encounter of 09/13/15 (from the past 24 hour(s))  CBC with Differential     Status: Abnormal   Collection Time: 09/13/15  2:50 AM  Result Value Ref Range   WBC 12.1 (H) 3.6 - 11.0 K/uL   RBC 4.14 3.80 - 5.20 MIL/uL   Hemoglobin 11.1 (L) 12.0 - 16.0 g/dL   HCT 34.5 (L) 35.0 - 47.0 %   MCV 83.5 80.0 - 100.0 fL   MCH 26.8 26.0 - 34.0 pg   MCHC 32.1 32.0 - 36.0 g/dL   RDW 14.2 11.5 - 14.5 %   Platelets 379 150 - 440 K/uL   Neutrophils Relative % 62 %   Neutro Abs 7.5 (H) 1.4 - 6.5 K/uL   Lymphocytes Relative 30 %   Lymphs Abs 3.7 (H) 1.0 - 3.6 K/uL   Monocytes Relative 7 %   Monocytes Absolute 0.9 0.2 - 0.9 K/uL   Eosinophils Relative 0 %   Eosinophils Absolute 0.0 0 - 0.7 K/uL   Basophils Relative 1 %   Basophils Absolute 0.1 0 - 0.1 K/uL  Basic metabolic panel     Status: Abnormal   Collection Time: 09/13/15  2:50 AM  Result Value Ref Range   Sodium 130 (L) 135 - 145 mmol/L   Potassium 4.0 3.5 - 5.1 mmol/L   Chloride 93 (L) 101 - 111 mmol/L   CO2 33 (H) 22 - 32 mmol/L   Glucose, Bld 77 65 - 99 mg/dL   BUN 37 (H) 6 - 20 mg/dL   Creatinine, Ser 0.58 0.44 - 1.00 mg/dL   Calcium 9.5 8.9 - 10.3 mg/dL   GFR calc non Af Amer >60 >60 mL/min   GFR calc Af  Amer >60 >60 mL/min   Anion gap 4 (L) 5 - 15  Troponin I     Status: None   Collection Time: 09/13/15  2:50 AM  Result Value Ref Range   Troponin I <0.03 <0.03 ng/mL  TSH     Status: None   Collection Time: 09/13/15  2:50 AM  Result Value Ref Range   TSH 1.562 0.350 - 4.500 uIU/mL  Hemoglobin A1c     Status: Abnormal   Collection Time: 09/13/15  2:50 AM  Result Value Ref Range   Hgb A1c MFr Bld 8.0 (H) 4.0 - 6.0 %  Glucose, capillary     Status: Abnormal   Collection Time: 09/13/15  8:44 AM  Result Value Ref Range   Glucose-Capillary 124 (H) 65 - 99 mg/dL   Comment 1 Notify RN   Protime-INR     Status: None   Collection  Time: 09/13/15  9:22 AM  Result Value Ref Range   Prothrombin Time 13.0 11.4 - 15.2 seconds   INR 0.98   APTT     Status: None   Collection Time: 09/13/15  9:22 AM  Result Value Ref Range   aPTT 28 24 - 36 seconds  Calcium     Status: None   Collection Time: 09/13/15  9:22 AM  Result Value Ref Range   Calcium 9.3 8.9 - 10.3 mg/dL  Albumin     Status: Abnormal   Collection Time: 09/13/15  9:22 AM  Result Value Ref Range   Albumin 3.1 (L) 3.5 - 5.0 g/dL  Type and screen If not already done in ED     Status: None   Collection Time: 09/13/15  9:22 AM  Result Value Ref Range   ABO/RH(D) A POS    Antibody Screen NEG    Sample Expiration 09/16/2015   Glucose, capillary     Status: Abnormal   Collection Time: 09/13/15 12:11 PM  Result Value Ref Range   Glucose-Capillary 187 (H) 65 - 99 mg/dL   Comment 1 Notify RN   Glucose, capillary     Status: Abnormal   Collection Time: 09/13/15  3:34 PM  Result Value Ref Range   Glucose-Capillary 185 (H) 65 - 99 mg/dL  Glucose, capillary     Status: Abnormal   Collection Time: 09/13/15  5:01 PM  Result Value Ref Range   Glucose-Capillary 186 (H) 65 - 99 mg/dL   Comment 1 Notify RN     Dg Chest 1 View  Result Date: 09/13/2015 CLINICAL DATA:  Preoperative chest x-ray.  Left hip fracture. EXAM: CHEST 1 VIEW  COMPARISON:  05/07/2015 FINDINGS: Normal heart size and pulmonary vascularity. Emphysematous changes in the lungs. Linear fibrosis in the right lung base. Esophageal hiatal hernia behind the heart. Calcified and tortuous aorta. No focal consolidation. No blunting of costophrenic angles. No pneumothorax. Old right rib fractures. Degenerative changes and scoliosis of the thoracolumbar spine. Degenerative changes in the shoulders. IMPRESSION: No evidence of active pulmonary disease. Emphysematous changes and fibrosis in the lungs. Esophageal hiatal hernia behind the heart. Electronically Signed   By: Lucienne Capers M.D.   On: 09/13/2015 03:30   Ct Head Wo Contrast  Result Date: 09/13/2015 CLINICAL DATA:  Status post fall, with forehead hematoma. Concern for cervical spine injury. Initial encounter. EXAM: CT HEAD WITHOUT CONTRAST CT CERVICAL SPINE WITHOUT CONTRAST TECHNIQUE: Multidetector CT imaging of the head and cervical spine was performed following the standard protocol without intravenous contrast. Multiplanar CT image reconstructions of the cervical spine were also generated. COMPARISON:  CT of the head performed 09/14/2014, and CT of the cervical spine performed 08/02/2014 FINDINGS: CT HEAD FINDINGS There is no evidence of acute infarction, mass lesion, or intra- or extra-axial hemorrhage on CT. Prominence of the ventricles and sulci reflects mild to moderate cortical volume loss. Scattered periventricular and subcortical white matter change likely reflects small vessel ischemic microangiopathy. A small chronic infarct is seen at the left cerebellar hemisphere. The brainstem and fourth ventricle are within normal limits. The basal ganglia are unremarkable in appearance. The cerebral hemispheres demonstrate grossly normal gray-white differentiation. No mass effect or midline shift is seen. There is no evidence of fracture; there is minimal nonspecific heterogeneity of visualized osseous structures. The  visualized portions of the orbits are within normal limits. The paranasal sinuses and mastoid air cells are well-aerated. Soft tissue swelling is noted overlying the frontal calvarium. CT  CERVICAL SPINE FINDINGS There is no evidence of fracture or subluxation. Vertebral bodies demonstrate normal height and alignment. Intervertebral disc space narrowing is noted at C5-C6, with a small anterior disc osteophyte complex. Prevertebral soft tissues are within normal limits. The visualized neural foramina are grossly unremarkable. The thyroid gland is unremarkable in appearance. The visualized lung apices are clear. Calcification is noted at the carotid bifurcations bilaterally, more prominent on the left. There is likely mild to moderate luminal narrowing at the proximal left internal carotid artery. IMPRESSION: 1. No evidence of traumatic intracranial injury or fracture. 2. No evidence of fracture or subluxation along the cervical spine. 3. Soft tissue swelling overlying the frontal calvarium. 4. Mild to moderate cortical volume loss and scattered small vessel ischemic microangiopathy. 5. 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 6. Calcification at the carotid bifurcations bilaterally, more prominent on the left. There is likely mild to moderate luminal narrowing at the proximal left internal carotid artery. Carotid ultrasound would be helpful for further evaluation, when and as deemed clinically appropriate. Electronically Signed   By: Garald Balding M.D.   On: 09/13/2015 04:01   Ct Cervical Spine Wo Contrast  Result Date: 09/13/2015 CLINICAL DATA:  Status post fall, with forehead hematoma. Concern for cervical spine injury. Initial encounter. EXAM: CT HEAD WITHOUT CONTRAST CT CERVICAL SPINE WITHOUT CONTRAST TECHNIQUE: Multidetector CT imaging of the head and cervical spine was performed following the standard protocol without intravenous contrast. Multiplanar CT image reconstructions of the cervical  spine were also generated. COMPARISON:  CT of the head performed 09/14/2014, and CT of the cervical spine performed 08/02/2014 FINDINGS: CT HEAD FINDINGS There is no evidence of acute infarction, mass lesion, or intra- or extra-axial hemorrhage on CT. Prominence of the ventricles and sulci reflects mild to moderate cortical volume loss. Scattered periventricular and subcortical white matter change likely reflects small vessel ischemic microangiopathy. A small chronic infarct is seen at the left cerebellar hemisphere. The brainstem and fourth ventricle are within normal limits. The basal ganglia are unremarkable in appearance. The cerebral hemispheres demonstrate grossly normal gray-white differentiation. No mass effect or midline shift is seen. There is no evidence of fracture; there is minimal nonspecific heterogeneity of visualized osseous structures. The visualized portions of the orbits are within normal limits. The paranasal sinuses and mastoid air cells are well-aerated. Soft tissue swelling is noted overlying the frontal calvarium. CT CERVICAL SPINE FINDINGS There is no evidence of fracture or subluxation. Vertebral bodies demonstrate normal height and alignment. Intervertebral disc space narrowing is noted at C5-C6, with a small anterior disc osteophyte complex. Prevertebral soft tissues are within normal limits. The visualized neural foramina are grossly unremarkable. The thyroid gland is unremarkable in appearance. The visualized lung apices are clear. Calcification is noted at the carotid bifurcations bilaterally, more prominent on the left. There is likely mild to moderate luminal narrowing at the proximal left internal carotid artery. IMPRESSION: 1. No evidence of traumatic intracranial injury or fracture. 2. No evidence of fracture or subluxation along the cervical spine. 3. Soft tissue swelling overlying the frontal calvarium. 4. Mild to moderate cortical volume loss and scattered small vessel  ischemic microangiopathy. 5. 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 6. Calcification at the carotid bifurcations bilaterally, more prominent on the left. There is likely mild to moderate luminal narrowing at the proximal left internal carotid artery. Carotid ultrasound would be helpful for further evaluation, when and as deemed clinically appropriate. Electronically Signed   By: Garald Balding  M.D.   On: 09/13/2015 04:01   Dg Pelvis Portable  Result Date: 09/13/2015 CLINICAL DATA:  Post open reduction internal fixation left intertrochanteric fracture EXAM: PORTABLE PELVIS 1-2 VIEWS COMPARISON:  Intraoperative film same day FINDINGS: Single portable frontal view of the pelvis submitted. Again noted intra medullary rod and compression screw in proximal left femur. There is anatomic alignment. Postsurgical changes are noted with lateral skin staples and periarticular soft tissue air. Intra medullary rod and compression screw is noted in right femur. IMPRESSION: Again noted intra medullary rod and compression screw in proximal left femur. There is anatomic alignment. Postsurgical changes are noted with lateral skin staples and periarticular soft tissue air. Intra medullary rod and compression screw is noted in right femur. Electronically Signed   By: Lahoma Crocker M.D.   On: 09/13/2015 16:32   Dg Shoulder Left  Result Date: 09/13/2015 CLINICAL DATA:  Left shoulder pain and limited range of motion after a fall. EXAM: LEFT SHOULDER - 2+ VIEW COMPARISON:  None. FINDINGS: Degenerative changes in the acromioclavicular and glenohumeral joints. Diffuse bone demineralization. No evidence of acute fracture or dislocation in the left humerus. Calcification along greater tuberosity may indicate calcific tendinitis. IMPRESSION: Degenerative changes in the left shoulder. No acute fractures. Soft tissue calcifications suggesting calcific tendinitis. Electronically Signed   By: Lucienne Capers M.D.   On:  09/13/2015 03:33   Dg C-arm 61-120 Min  Result Date: 09/13/2015 CLINICAL DATA:  Portable imaging following left intertrochanteric proximal femur fracture ORIF EXAM: LEFT FEMUR 2 VIEWS; DG C-ARM 61-120 MIN COMPARISON:  09/13/2015 at 2:59 a.m. FINDINGS: Images show placement long intra medullary rod supporting a compression screw. The major fracture components have been reduced into near anatomic alignment. No new fracture or evidence of an operative complication. IMPRESSION: Well aligned major fracture components following ORIF of the left proximal femur intertrochanteric fracture. Electronically Signed   By: Lajean Manes M.D.   On: 09/13/2015 15:24   Dg Hip Unilat With Pelvis 2-3 Views Left  Result Date: 09/13/2015 CLINICAL DATA:  Fall with hematoma to the forehead. Left hip pain with shortening and rotation EXAM: DG HIP (WITH OR WITHOUT PELVIS) 2-3V LEFT COMPARISON:  None. FINDINGS: Acute inter trochanteric fracture of the left hip with varus angulation of the fracture fragments. No dislocation at the hip joint. Pelvis appears intact. Degenerative changes in the lower lumbar spine and in both hips. Previous intra medullary rod fixation of the right hip. Vascular calcifications. IMPRESSION: Acute comminuted fracture of the inter trochanteric left hip with varus angulation. Electronically Signed   By: Lucienne Capers M.D.   On: 09/13/2015 03:31   Dg Femur Min 2 Views Left  Result Date: 09/13/2015 CLINICAL DATA:  82 year old female status post left femur surgery for intertrochanteric fracture. Initial encounter. EXAM: LEFT FEMUR 2 VIEWS COMPARISON:  Intraoperative images 1558 hours today. FINDINGS: Portable AP and cross-table lateral views of the left femur. Left femur intra medullary rod placed with proximal interlocking dynamic hip screw and distal interlocking cortical screws. Near anatomic alignment about the intertrochanteric fracture. Hardware appears intact. Left femoral head remains normally  located. Overlying postoperative changes to the soft tissues with subcutaneous gas and skin staples. Iliac and femoral artery calcified atherosclerosis. IMPRESSION: 1. Left femur ORIF with no adverse features. 2. Calcified peripheral vascular disease. Electronically Signed   By: Genevie Ann M.D.   On: 09/13/2015 16:29   Dg Femur Min 2 Views Left  Result Date: 09/13/2015 CLINICAL DATA:  Portable imaging following left intertrochanteric  proximal femur fracture ORIF EXAM: LEFT FEMUR 2 VIEWS; DG C-ARM 61-120 MIN COMPARISON:  09/13/2015 at 2:59 a.m. FINDINGS: Images show placement long intra medullary rod supporting a compression screw. The major fracture components have been reduced into near anatomic alignment. No new fracture or evidence of an operative complication. IMPRESSION: Well aligned major fracture components following ORIF of the left proximal femur intertrochanteric fracture. Electronically Signed   By: Lajean Manes M.D.   On: 09/13/2015 15:24    Assessment/Plan: Day of Surgery   Active Problems:   Hip fracture [facility])  Patient is doing well postop. I reviewed the patient's postoperative x-rays. Her fractures well reduced and the intramedullary hardware is well positioned.  Patient will complete 24 hours of postop antibiotics. She'll begin physical and occupational therapy in the morning. Patient will advance her diet this evening. Labs will be rechecked in the morning. She will begin Lovenox tomorrow for DVT prophylaxis.    Thornton Park , MD 09/13/2015, 6:25 PM

## 2015-09-13 NOTE — Op Note (Signed)
DATE OF SURGERY:  09/13/2015  TIME: 3:52 PM  PATIENT NAME:  Amanda Stark  AGE: 82 y.o.  PRE-OPERATIVE DIAGNOSIS:  Left intertrochanteric hip fracture  POST-OPERATIVE DIAGNOSIS:  SAME  PROCEDURE:  INTRAMEDULLARY (IM) NAIL LEFT INTERTROCHANTRIC HIP FRACTURE  SURGEON:  Thornton Park  OPERATIVE IMPLANTS: [employer] Affixus nail 11 x 340, 100 mm lag screw with 42 and 44 mm distal interlocking screws  PREOPERATIVE INDICATIONS:  Amanda Stark is a 82 y.o. year old who fell and suffered a hip fracture. She was brought into the ER and then admitted and medically cleared for surgical intervention.    The risks, benefits and alternatives were discussed with the patient and their family.  The risks include but are not limited to infection, bleeding, nerve or blood vessel injury, malunion, nonunion, hardware prominence, hardware failure, change in leg lengths or lower extremity rotation need for further surgery including hardware removal with conversion to a total hip arthroplasty. Medical risks include but are not limited to DVT and pulmonary embolism, myocardial infarction, stroke, pneumonia, respiratory failure and death. The patient and their family understood these risks and wished to proceed with surgery.  OPERATIVE PROCEDURE:  The patient was brought to the operating room and placed in the supine position on the fracture table. general anesthesia was administered.  A closed reduction was performed under C-arm guidance.  The fracture reduction was confirmed on both AP and lateral views. A time out was performed to verify the patient's name, date of birth, medical record number, correct site of surgery correct procedure to be performed. The timeout was also used to verify the patient received antibiotics and all appropriate instruments, implants and radiographic studies were available in the room. Once all in attendance were in agreement, the case began. The patient was prepped and draped in a  sterile fashion. She received preoperative antibiotics of Kefzol 2 grams IV.  An incision was made proximal to the greater trochanter in line with the femur. A guidewire was placed over the tip of the greater trochanter and advanced into the proximal femur to the level of the lesser trochanter.  Confirmation of the drill pin position was made on AP and lateral C-arm images.  The threaded guidepin was then overdrilled with the proximal femoral drill.  The nail was then inserted into the proximal femur, across the fracture site and into the femoral shaft. Its position was confirmed on AP and lateral C-arm images.   Once the nail was completely seated, the drill guide for the lag screw was placed through the guide arm for the Affixus nail. A guidepin was then placed through this drill guide and advanced through the lateral cortex of the femur, across the fracture site and into the femoral head achieving a tip apex distance of less than 25 mm. The length of the drill pin was measured, and then the drill for the lag screw was advanced through the lateral cortex, across the fracture site and up into the femoral head to the depth of the lag screw..  The lag screw was then advanced by hand into position across the fracture site into the femoral head. Its final position was confirmed on AP and lateral C-arm images. Compression was applied as traction was carefully released. The set screw in the top of the intramedullary rod was tightened by hand using a screwdriver. It was backed off a quarter turn to allow for compression at the fracture site.  The insertion handle for the Affixus nail was then  removed proximally. The attention was then turned to the distal interlocking screws.  Two distal interlocking screws were placed. A perfect circle technique was used. Two stab incisions were made over the distal interlocking holes. A freehand technique was used to drill each hole. A depth gauge was used to measure the width  of the femur at each distal interlocking hole.  Two distal interlocking screws were advanced into position by hand. Final AP and lateral C-arm images of the intramedullary construct were taken both proximally and distally.  The wounds were irrigated copiously and closed with 0 Vicryl for closure of the deep fascia and 2-0 Vicryl for subcutaneous closure. The skin was approximated with staples. A dry sterile dressing was applied.  The patient had a skin tear on her left medial leg.  After drapes were taken down, the skin tear was cleaned with betadine and saline.   The skin was re-approximated with forceps and held into position with steri-strips.  Xeroform and a dry sterile dressing were applied.   I was scrubbed and present the entire case and all sharp and instrument counts were correct at the conclusion of the case. Patient was transferred to hospital bed and brought to PACU in stable condition. I spoke with the patient's family in the postop consultation room to let them know the case had been performed without complication and the patient was stable in the recovery room.    Timoteo Gaul, MD

## 2015-09-13 NOTE — Transfer of Care (Signed)
Immediate Anesthesia Transfer of Care Note  Patient: Amanda Stark  Procedure(s) Performed: Procedure(s): INTRAMEDULLARY (IM) NAIL INTERTROCHANTRIC (Left)  Patient Location: PACU  Anesthesia Type:General  Level of Consciousness: awake  Airway & Oxygen Therapy: Patient connected to face mask oxygen  Post-op Assessment: Post -op Vital signs reviewed and stable  Post vital signs: stable  Last Vitals:  Vitals:   09/13/15 1236 09/13/15 1529  BP: (!) 184/78 (!) 164/69  Pulse: (!) 54 (!) 58  Resp: 16 12  Temp: 36.7 C (!) 36.1 C    Last Pain:  Vitals:   09/13/15 1529  TempSrc: Temporal  PainSc:       Patients Stated Pain Goal: 4 ([id number])  Complications: No apparent anesthesia complications

## 2015-09-13 NOTE — H&P (Signed)
Amanda Stark is an 82 y.o. female.   Chief Complaint: Fall HPI: The patient with past medical history of dementia as well as SVT and diabetes presents to the emergency department after suffering a fall. The patient hit her head on the wall. It is unclear if there was a loss of consciousness. She has a large bruise on her forehead. CT of her head was negative for acute intracranial process. However her left hip is tender to touch. X-ray showed intertrochanteric fracture of the femur which prompted the emergency department staff to call for admission.  Past Medical History:  Diagnosis Date  . Diabetes mellitus without complication ([place])   . Glenohumeral arthritis   . Hyperlipidemia   . Hypertension   . Instability of right shoulder joint   . Lumbar spinal stenosis   . Osteoporosis   . PMR (polymyalgia rheumatica) ([facility])   . SVT (supraventricular tachycardia) ([place])   . Thyroid cancer ([place])   . Thyroid disease   . Vertigo     Past Surgical History:  Procedure Laterality Date  . ABDOMINAL HYSTERECTOMY    . APPENDECTOMY    . INTRAMEDULLARY (IM) NAIL INTERTROCHANTERIC Right 05/04/2015   Procedure: INTRAMEDULLARY (IM) NAIL INTERTROCHANTRIC;  Surgeon: Corky Mull, MD;  Location: [facility] ORS;  Service: Orthopedics;  Laterality: Right;  . kidney stone removal    . right lens implant    . THYROIDECTOMY    . TOTAL THYROIDECTOMY      Family History  Problem Relation Age of Onset  . Cancer Mother   . Cancer Sister    Social History:  reports that she has never smoked. She has never used smokeless tobacco. She reports that she does not drink alcohol or use drugs.  Allergies:  Allergies  Allergen Reactions  . Ascriptin A-D [Aspirin Buf(Alhyd-Mghyd-Cacar)] Other (See Comments)    Reaction:  Unknown   . Aspirin Other (See Comments)    Reaction:  Unknown   . Codeine Other (See Comments)    Reaction:  Unknown   . Hydrocodone Other (See Comments)    Reaction:  Unknown   . Statins Other  (See Comments)    Reaction:  Muscle pain    . Tramadol Nausea And Vomiting    Medications Prior to Admission  Medication Sig Dispense Refill  . acetaminophen (TYLENOL) 325 MG tablet Take 650 mg by mouth 3 (three) times daily.    . bisacodyl (DULCOLAX) 10 MG suppository Place 1 suppository (10 mg total) rectally daily as needed for moderate constipation. 12 suppository 0  . Calcium Carbonate-Vitamin D (CALCIUM 600+D) 600-400 MG-UNIT tablet Take 1 tablet by mouth 2 (two) times daily.    [name] demeclocycline (DECLOMYCIN) 300 MG tablet Take 450 mg by mouth 2 (two) times daily.    [name] glucagon (GLUCAGEN) 1 MG SOLR injection Inject 1 mg into the muscle once as needed for low blood sugar.    . guaifenesin (ROBITUSSIN) 100 MG/5ML syrup Take 200 mg by mouth every 4 (four) hours as needed for cough.     . insulin detemir (LEVEMIR) 100 UNIT/ML injection Inject 0.03 mLs (3 Units total) into the skin 2 (two) times daily. Pt uses 10 units in the morning and 4 units at bedtime. (Patient taking differently: Inject 5-18 Units into the skin 2 (two) times daily. Pt uses 18 units in the morning and 5 units at bedtime.) 10 mL 11  . levothyroxine (SYNTHROID, LEVOTHROID) 150 MCG tablet Take 150 mcg by mouth daily before breakfast.    .  lidocaine (LIDODERM) 5 % Place 1 patch onto the skin daily.     [name] LORazepam (ATIVAN) 1 MG tablet Take 1 tablet (1 mg total) by mouth at bedtime. 30 tablet 0  . meclizine (ANTIVERT) 25 MG tablet Take 25 mg by mouth 3 (three) times daily as needed for nausea.    . metoprolol (LOPRESSOR) 50 MG tablet Take 1 tablet (50 mg total) by mouth 2 (two) times daily. 1 tablet 1  . omeprazole (PRILOSEC) 20 MG capsule Take 20 mg by mouth daily.    . polyethylene glycol (MIRALAX / GLYCOLAX) packet Take 17 g by mouth every other day.     . predniSONE (DELTASONE) 5 MG tablet Take 7.5 mg by mouth daily with breakfast.     . senna-docusate (SENOKOT-S) 8.6-50 MG tablet Take 1 tablet by mouth 2 (two) times  daily.    . sotalol (BETAPACE) 80 MG tablet Take 0.5 tablets (40 mg total) by mouth every 12 (twelve) hours. 1 tablet 1  . traMADol (ULTRAM) 50 MG tablet Take by mouth 3 (three) times daily.    [name] amLODipine (NORVASC) 5 MG tablet Take 5 mg by mouth daily.    [name] docusate sodium (COLACE) 100 MG capsule Take 1 capsule (100 mg total) by mouth 2 (two) times daily. (Patient not taking: Reported on 09/13/2015) 10 capsule 0  . enoxaparin (LOVENOX) 40 MG/0.4ML injection Inject 0.4 mLs (40 mg total) into the skin daily. 0 Syringe   . oxyCODONE (OXY IR/ROXICODONE) 5 MG immediate release tablet Take 1-2 tablets (5-10 mg total) by mouth every 3 (three) hours as needed for breakthrough pain. (Patient not taking: Reported on 09/13/2015) 30 tablet 0    Results for orders placed or performed during the hospital encounter of 09/13/15 (from the past 48 hour(s))  CBC with Differential     Status: Abnormal   Collection Time: 09/13/15  2:50 AM  Result Value Ref Range   WBC 12.1 (H) 3.6 - 11.0 K/uL   RBC 4.14 3.80 - 5.20 MIL/uL   Hemoglobin 11.1 (L) 12.0 - 16.0 g/dL   HCT 34.5 (L) 35.0 - 47.0 %   MCV 83.5 80.0 - 100.0 fL   MCH 26.8 26.0 - 34.0 pg   MCHC 32.1 32.0 - 36.0 g/dL   RDW 14.2 11.5 - 14.5 %   Platelets 379 150 - 440 K/uL   Neutrophils Relative % 62 %   Neutro Abs 7.5 (H) 1.4 - 6.5 K/uL   Lymphocytes Relative 30 %   Lymphs Abs 3.7 (H) 1.0 - 3.6 K/uL   Monocytes Relative 7 %   Monocytes Absolute 0.9 0.2 - 0.9 K/uL   Eosinophils Relative 0 %   Eosinophils Absolute 0.0 0 - 0.7 K/uL   Basophils Relative 1 %   Basophils Absolute 0.1 0 - 0.1 K/uL  Basic metabolic panel     Status: Abnormal   Collection Time: 09/13/15  2:50 AM  Result Value Ref Range   Sodium 130 (L) 135 - 145 mmol/L   Potassium 4.0 3.5 - 5.1 mmol/L   Chloride 93 (L) 101 - 111 mmol/L   CO2 33 (H) 22 - 32 mmol/L   Glucose, Bld 77 65 - 99 mg/dL   BUN 37 (H) 6 - 20 mg/dL   Creatinine, Ser 0.58 0.44 - 1.00 mg/dL   Calcium 9.5 8.9 - 10.3  mg/dL   GFR calc non Af Amer >60 >60 mL/min   GFR calc Af Amer >60 >60 mL/min    Comment: (  NOTE) The eGFR has been calculated using the CKD EPI equation. This calculation has not been validated in all clinical situations. eGFR's persistently <60 mL/min signify possible Chronic Kidney Disease.    Anion gap 4 (L) 5 - 15  Troponin I     Status: None   Collection Time: 09/13/15  2:50 AM  Result Value Ref Range   Troponin I <0.03 <0.03 ng/mL   Dg Chest 1 View  Result Date: 09/13/2015 CLINICAL DATA:  Preoperative chest x-ray.  Left hip fracture. EXAM: CHEST 1 VIEW COMPARISON:  05/07/2015 FINDINGS: Normal heart size and pulmonary vascularity. Emphysematous changes in the lungs. Linear fibrosis in the right lung base. Esophageal hiatal hernia behind the heart. Calcified and tortuous aorta. No focal consolidation. No blunting of costophrenic angles. No pneumothorax. Old right rib fractures. Degenerative changes and scoliosis of the thoracolumbar spine. Degenerative changes in the shoulders. IMPRESSION: No evidence of active pulmonary disease. Emphysematous changes and fibrosis in the lungs. Esophageal hiatal hernia behind the heart. Electronically Signed   By: Lucienne Capers M.D.   On: 09/13/2015 03:30   Ct Head Wo Contrast  Result Date: 09/13/2015 CLINICAL DATA:  Status post fall, with forehead hematoma. Concern for cervical spine injury. Initial encounter. EXAM: CT HEAD WITHOUT CONTRAST CT CERVICAL SPINE WITHOUT CONTRAST TECHNIQUE: Multidetector CT imaging of the head and cervical spine was performed following the standard protocol without intravenous contrast. Multiplanar CT image reconstructions of the cervical spine were also generated. COMPARISON:  CT of the head performed 09/14/2014, and CT of the cervical spine performed 08/02/2014 FINDINGS: CT HEAD FINDINGS There is no evidence of acute infarction, mass lesion, or intra- or extra-axial hemorrhage on CT. Prominence of the ventricles and sulci  reflects mild to moderate cortical volume loss. Scattered periventricular and subcortical white matter change likely reflects small vessel ischemic microangiopathy. A small chronic infarct is seen at the left cerebellar hemisphere. The brainstem and fourth ventricle are within normal limits. The basal ganglia are unremarkable in appearance. The cerebral hemispheres demonstrate grossly normal gray-white differentiation. No mass effect or midline shift is seen. There is no evidence of fracture; there is minimal nonspecific heterogeneity of visualized osseous structures. The visualized portions of the orbits are within normal limits. The paranasal sinuses and mastoid air cells are well-aerated. Soft tissue swelling is noted overlying the frontal calvarium. CT CERVICAL SPINE FINDINGS There is no evidence of fracture or subluxation. Vertebral bodies demonstrate normal height and alignment. Intervertebral disc space narrowing is noted at C5-C6, with a small anterior disc osteophyte complex. Prevertebral soft tissues are within normal limits. The visualized neural foramina are grossly unremarkable. The thyroid gland is unremarkable in appearance. The visualized lung apices are clear. Calcification is noted at the carotid bifurcations bilaterally, more prominent on the left. There is likely mild to moderate luminal narrowing at the proximal left internal carotid artery. IMPRESSION: 1. No evidence of traumatic intracranial injury or fracture. 2. No evidence of fracture or subluxation along the cervical spine. 3. Soft tissue swelling overlying the frontal calvarium. 4. Mild to moderate cortical volume loss and scattered small vessel ischemic microangiopathy. 5. 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 6. Calcification at the carotid bifurcations bilaterally, more prominent on the left. There is likely mild to moderate luminal narrowing at the proximal left internal carotid artery. Carotid ultrasound would be  helpful for further evaluation, when and as deemed clinically appropriate. Electronically Signed   By: Garald Balding M.D.   On: 09/13/2015 04:01   Ct  Cervical Spine Wo Contrast  Result Date: 09/13/2015 CLINICAL DATA:  Status post fall, with forehead hematoma. Concern for cervical spine injury. Initial encounter. EXAM: CT HEAD WITHOUT CONTRAST CT CERVICAL SPINE WITHOUT CONTRAST TECHNIQUE: Multidetector CT imaging of the head and cervical spine was performed following the standard protocol without intravenous contrast. Multiplanar CT image reconstructions of the cervical spine were also generated. COMPARISON:  CT of the head performed 09/14/2014, and CT of the cervical spine performed 08/02/2014 FINDINGS: CT HEAD FINDINGS There is no evidence of acute infarction, mass lesion, or intra- or extra-axial hemorrhage on CT. Prominence of the ventricles and sulci reflects mild to moderate cortical volume loss. Scattered periventricular and subcortical white matter change likely reflects small vessel ischemic microangiopathy. A small chronic infarct is seen at the left cerebellar hemisphere. The brainstem and fourth ventricle are within normal limits. The basal ganglia are unremarkable in appearance. The cerebral hemispheres demonstrate grossly normal gray-white differentiation. No mass effect or midline shift is seen. There is no evidence of fracture; there is minimal nonspecific heterogeneity of visualized osseous structures. The visualized portions of the orbits are within normal limits. The paranasal sinuses and mastoid air cells are well-aerated. Soft tissue swelling is noted overlying the frontal calvarium. CT CERVICAL SPINE FINDINGS There is no evidence of fracture or subluxation. Vertebral bodies demonstrate normal height and alignment. Intervertebral disc space narrowing is noted at C5-C6, with a small anterior disc osteophyte complex. Prevertebral soft tissues are within normal limits. The visualized neural  foramina are grossly unremarkable. The thyroid gland is unremarkable in appearance. The visualized lung apices are clear. Calcification is noted at the carotid bifurcations bilaterally, more prominent on the left. There is likely mild to moderate luminal narrowing at the proximal left internal carotid artery. IMPRESSION: 1. No evidence of traumatic intracranial injury or fracture. 2. No evidence of fracture or subluxation along the cervical spine. 3. Soft tissue swelling overlying the frontal calvarium. 4. Mild to moderate cortical volume loss and scattered small vessel ischemic microangiopathy. 5. 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 6. Calcification at the carotid bifurcations bilaterally, more prominent on the left. There is likely mild to moderate luminal narrowing at the proximal left internal carotid artery. Carotid ultrasound would be helpful for further evaluation, when and as deemed clinically appropriate. Electronically Signed   By: Garald Balding M.D.   On: 09/13/2015 04:01   Dg Shoulder Left  Result Date: 09/13/2015 CLINICAL DATA:  Left shoulder pain and limited range of motion after a fall. EXAM: LEFT SHOULDER - 2+ VIEW COMPARISON:  None. FINDINGS: Degenerative changes in the acromioclavicular and glenohumeral joints. Diffuse bone demineralization. No evidence of acute fracture or dislocation in the left humerus. Calcification along greater tuberosity may indicate calcific tendinitis. IMPRESSION: Degenerative changes in the left shoulder. No acute fractures. Soft tissue calcifications suggesting calcific tendinitis. Electronically Signed   By: Lucienne Capers M.D.   On: 09/13/2015 03:33   Dg Hip Unilat With Pelvis 2-3 Views Left  Result Date: 09/13/2015 CLINICAL DATA:  Fall with hematoma to the forehead. Left hip pain with shortening and rotation EXAM: DG HIP (WITH OR WITHOUT PELVIS) 2-3V LEFT COMPARISON:  None. FINDINGS: Acute inter trochanteric fracture of the left hip  with varus angulation of the fracture fragments. No dislocation at the hip joint. Pelvis appears intact. Degenerative changes in the lower lumbar spine and in both hips. Previous intra medullary rod fixation of the right hip. Vascular calcifications. IMPRESSION: Acute comminuted fracture of the inter trochanteric left  hip with varus angulation. Electronically Signed   By: Lucienne Capers M.D.   On: 09/13/2015 03:31    Review of Systems  Constitutional: Negative for chills and fever.  HENT: Negative for sore throat and tinnitus.   Eyes: Negative for blurred vision and redness.  Respiratory: Negative for cough and shortness of breath.   Cardiovascular: Negative for chest pain, palpitations, orthopnea and PND.  Gastrointestinal: Negative for abdominal pain, diarrhea, nausea and vomiting.  Genitourinary: Negative for dysuria, frequency and urgency.  Musculoskeletal: Positive for falls and joint pain. Negative for myalgias.  Skin: Negative for rash.       No lesions  Neurological: Positive for headaches. Negative for speech change, focal weakness and weakness.  Endo/Heme/Allergies: Does not bruise/bleed easily.       No temperature intolerance  Psychiatric/Behavioral: Negative for depression and suicidal ideas.    Blood pressure (!) 142/75, pulse (!) 55, temperature 97.8 F (36.6 C), temperature source Oral, resp. rate 16, height 5' 3" (1.6 m), weight 47.6 kg (105 lb), SpO2 99 %. Physical Exam  Vitals reviewed. Constitutional: She is oriented to person, place, and time. She appears well-developed and well-nourished. No distress.  HENT:  Head: Normocephalic and atraumatic.  Mouth/Throat: Oropharynx is clear and moist.  Large hematoma anterior forhead  Eyes: Conjunctivae and EOM are normal. Pupils are equal, round, and reactive to light. No scleral icterus.  Neck: Normal range of motion. Neck supple. No JVD present. No tracheal deviation present. No thyromegaly present.  Cardiovascular:  Normal rate, regular rhythm and normal heart sounds.  Exam reveals no gallop and no friction rub.   No murmur heard. Respiratory: Effort normal and breath sounds normal.  GI: Soft. Bowel sounds are normal. She exhibits no distension. There is no tenderness.  Genitourinary:  Genitourinary Comments: Deferred  Musculoskeletal: She exhibits no edema.  LLE shortened and externally rotated  Lymphadenopathy:    She has no cervical adenopathy.  Neurological: She is alert and oriented to person, place, and time. No cranial nerve deficit. She exhibits normal muscle tone.  Skin: Skin is warm and dry. No rash noted. No erythema.  Psychiatric: She has a normal mood and affect. Her behavior is normal. Judgment and thought content normal.     Assessment/Plan This is a 82 year old female admitted for hip fracture. 1. Hip fracture: Left intertrochanteric fracture. Due to advanced age the patient is a moderate to high surgical risk. However, the patient has recently undergone a right intertrochanteric hip repair due to similar circumstances. She has not been able to walk for long time but due to dementia recently tries to get out of bed at night and walk to the bathroom. Though she is stable from a cardiopulmonary standpoint it may not be wise to make her more functional and able to move enough to fall again. Orthopedic surgery has been consulted and will discuss with the family. 2. Diabetes mellitus type 2: Continue long-acting insulin adjusted for hospital diet. (The patient reports that she is on a total of 23 units of long-acting insulin split twice a day). Anticipate surgery on May the patient nothing by mouth. She is on a D5 drip and I have adjusted her insulin regimen to 6 units of Levemir daily at bedtime. Sliding scale insulin while hospitalized. Adjust prior to discharge back to her nursing home. 3. Essential hypertension: Stable 4. SVT: Controlled. Continue sotalol 5. Hypothyroidism: Continue Synthroid   6. SIADH: Chronic; continue demeclocycline 7. DVT prophylaxis: SCDs 8. GI prophylaxis: Pantoprazole The patient  is a DO NOT RESUSCITATE. Time spent on admission orders and patient care approximately 45 minutes  Harrie Foreman, MD 09/13/2015, 7:28 AM

## 2015-09-13 NOTE — ED Notes (Signed)
Report given to [facility] [employer]

## 2015-09-13 NOTE — ED Notes (Signed)
Patient transported to CT and xray 

## 2015-09-14 ENCOUNTER — Encounter: Payer: Self-pay | Admitting: Orthopedic Surgery

## 2015-09-14 LAB — URINALYSIS COMPLETE WITH MICROSCOPIC (ARMC ONLY)
Bacteria, UA: NONE SEEN
Bilirubin Urine: NEGATIVE
Glucose, UA: 50 mg/dL — AB
KETONES UR: NEGATIVE mg/dL
LEUKOCYTES UA: NEGATIVE
Nitrite: NEGATIVE
PH: 5 (ref 5.0–8.0)
PROTEIN: NEGATIVE mg/dL
SPECIFIC GRAVITY, URINE: 1.019 (ref 1.005–1.030)
SQUAMOUS EPITHELIAL / LPF: NONE SEEN

## 2015-09-14 LAB — CBC
HEMATOCRIT: 23.6 % — AB (ref 35.0–47.0)
Hemoglobin: 7.7 g/dL — ABNORMAL LOW (ref 12.0–16.0)
MCH: 27.4 pg (ref 26.0–34.0)
MCHC: 32.7 g/dL (ref 32.0–36.0)
MCV: 83.8 fL (ref 80.0–100.0)
PLATELETS: 269 10*3/uL (ref 150–440)
RBC: 2.82 MIL/uL — AB (ref 3.80–5.20)
RDW: 14.3 % (ref 11.5–14.5)
WBC: 20.1 10*3/uL — ABNORMAL HIGH (ref 3.6–11.0)

## 2015-09-14 LAB — GLUCOSE, CAPILLARY
Glucose-Capillary: 181 mg/dL — ABNORMAL HIGH (ref 65–99)
Glucose-Capillary: 144 mg/dL — ABNORMAL HIGH (ref 65–99)
Glucose-Capillary: 138 mg/dL — ABNORMAL HIGH (ref 65–99)
Glucose-Capillary: 173 mg/dL — ABNORMAL HIGH (ref 65–99)
Glucose-Capillary: 242 mg/dL — ABNORMAL HIGH (ref 65–99)
Glucose-Capillary: 245 mg/dL — ABNORMAL HIGH (ref 65–99)

## 2015-09-14 LAB — BASIC METABOLIC PANEL
Anion gap: 7 (ref 5–15)
BUN: 29 mg/dL — AB (ref 6–20)
CO2: 31 mmol/L (ref 22–32)
Calcium: 8.7 mg/dL — ABNORMAL LOW (ref 8.9–10.3)
Chloride: 95 mmol/L — ABNORMAL LOW (ref 101–111)
Creatinine, Ser: 0.7 mg/dL (ref 0.44–1.00)
GFR calc Af Amer: >60 mL/min (ref >60)
GLUCOSE: 145 mg/dL — AB (ref 65–99)
POTASSIUM: 4 mmol/L (ref 3.5–5.1)
Sodium: 133 mmol/L — ABNORMAL LOW (ref 135–145)

## 2015-09-14 LAB — VITAMIN D 25 HYDROXY (VIT D DEFICIENCY, FRACTURES): Vit D, 25-Hydroxy: 65.4 ng/mL (ref 30.0–100.0)

## 2015-09-14 LAB — PREPARE RBC (CROSSMATCH)

## 2015-09-14 MED ORDER — SODIUM CHLORIDE 0.9 % IV SOLN
Freq: Once | INTRAVENOUS | Status: AC
  Administered 2015-09-14: 10:00:00 via INTRAVENOUS

## 2015-09-14 MED ORDER — DIPHENHYDRAMINE HCL 25 MG PO CAPS
25.0000 mg | ORAL_CAPSULE | Freq: Once | ORAL | Status: AC
  Administered 2015-09-14: 25 mg via ORAL
  Filled 2015-09-14: qty 1

## 2015-09-14 MED ORDER — INSULIN ASPART 100 UNIT/ML ~~LOC~~ SOLN
0.0000 [IU] | Freq: Three times a day (TID) | SUBCUTANEOUS | Status: DC
  Administered 2015-09-14 – 2015-09-15 (×2): 3 [IU] via SUBCUTANEOUS
  Filled 2015-09-14 (×2): qty 3

## 2015-09-14 NOTE — Progress Notes (Signed)
Subjective:  POD # 1  is post intramedullary fixation for left intertrochanteric hip fracture. Patient reports pain as minimal. She complains of constipation. Her daughter and granddaughter at the bedside.  Patient received 1 unit of packed red blood cells today for anemia.  Objective:   VITALS:   Vitals:   09/14/15 1043 09/14/15 1255 09/14/15 1300 09/14/15 1421  BP: (!) 128/50 (!) 115/101 (!) 149/47 (!) 140/59  Pulse: 68   80  Resp: 18 18  16   Temp: 98 F (36.7 C) 97.9 F (36.6 C)  98.5 F (36.9 C)  TempSrc: Oral Oral  Oral  SpO2: 100% 100%  100%  Weight:      Height:        PHYSICAL EXAM:  Left lower extremity: Patient's dressings are clean dry and intact. There is no significant thigh swelling. Her thigh and leg compartments are soft and compressible. There is no erythema or ecchymosis over the left hip or thigh. Patient has palpable pedal pulses, intact sensation light touch and intact motor function throughout the left lower extremity.   LABS  Results for orders placed or performed during the hospital encounter of 09/13/15 (from the past 24 hour(s))  Glucose, capillary     Status: Abnormal   Collection Time: 09/13/15  9:01 PM  Result Value Ref Range   Glucose-Capillary 143 (H) 65 - 99 mg/dL  Glucose, capillary     Status: Abnormal   Collection Time: 09/14/15  1:55 AM  Result Value Ref Range   Glucose-Capillary 181 (H) 65 - 99 mg/dL  CBC     Status: Abnormal   Collection Time: 09/14/15  4:59 AM  Result Value Ref Range   WBC 20.1 (H) 3.6 - 11.0 K/uL   RBC 2.82 (L) 3.80 - 5.20 MIL/uL   Hemoglobin 7.7 (L) 12.0 - 16.0 g/dL   HCT 23.6 (L) 35.0 - 47.0 %   MCV 83.8 80.0 - 100.0 fL   MCH 27.4 26.0 - 34.0 pg   MCHC 32.7 32.0 - 36.0 g/dL   RDW 14.3 11.5 - 14.5 %   Platelets 269 150 - 440 K/uL  Basic metabolic panel     Status: Abnormal   Collection Time: 09/14/15  4:59 AM  Result Value Ref Range   Sodium 133 (L) 135 - 145 mmol/L   Potassium 4.0 3.5 - 5.1 mmol/L    Chloride 95 (L) 101 - 111 mmol/L   CO2 31 22 - 32 mmol/L   Glucose, Bld 145 (H) 65 - 99 mg/dL   BUN 29 (H) 6 - 20 mg/dL   Creatinine, Ser 0.70 0.44 - 1.00 mg/dL   Calcium 8.7 (L) 8.9 - 10.3 mg/dL   GFR calc non Af Amer >60 >60 mL/min   GFR calc Af Amer >60 >60 mL/min   Anion gap 7 5 - 15  Glucose, capillary     Status: Abnormal   Collection Time: 09/14/15  6:25 AM  Result Value Ref Range   Glucose-Capillary 144 (H) 65 - 99 mg/dL  Glucose, capillary     Status: Abnormal   Collection Time: 09/14/15  7:25 AM  Result Value Ref Range   Glucose-Capillary 138 (H) 65 - 99 mg/dL   Comment 1 Notify RN   Prepare RBC     Status: None   Collection Time: 09/14/15  8:00 AM  Result Value Ref Range   Order Confirmation ORDER PROCESSED BY BLOOD BANK   Urinalysis complete, with microscopic ([facility] only)     Status: Abnormal  Collection Time: 09/14/15 11:08 AM  Result Value Ref Range   Color, Urine YELLOW (A) YELLOW   APPearance CLEAR (A) CLEAR   Glucose, UA 50 (A) NEGATIVE mg/dL   Bilirubin Urine NEGATIVE NEGATIVE   Ketones, ur NEGATIVE NEGATIVE mg/dL   Specific Gravity, Urine 1.019 1.005 - 1.030   Hgb urine dipstick 1+ (A) NEGATIVE   pH 5.0 5.0 - 8.0   Protein, ur NEGATIVE NEGATIVE mg/dL   Nitrite NEGATIVE NEGATIVE   Leukocytes, UA NEGATIVE NEGATIVE   RBC / HPF 0-5 0 - 5 RBC/hpf   WBC, UA 0-5 0 - 5 WBC/hpf   Bacteria, UA NONE SEEN NONE SEEN   Squamous Epithelial / LPF NONE SEEN NONE SEEN  Glucose, capillary     Status: Abnormal   Collection Time: 09/14/15 12:25 PM  Result Value Ref Range   Glucose-Capillary 173 (H) 65 - 99 mg/dL  Glucose, capillary     Status: Abnormal   Collection Time: 09/14/15  4:38 PM  Result Value Ref Range   Glucose-Capillary 242 (H) 65 - 99 mg/dL   Comment 1 Notify RN     Dg Chest 1 View  Result Date: 09/13/2015 CLINICAL DATA:  Preoperative chest x-ray.  Left hip fracture. EXAM: CHEST 1 VIEW COMPARISON:  05/07/2015 FINDINGS: Normal heart size and pulmonary  vascularity. Emphysematous changes in the lungs. Linear fibrosis in the right lung base. Esophageal hiatal hernia behind the heart. Calcified and tortuous aorta. No focal consolidation. No blunting of costophrenic angles. No pneumothorax. Old right rib fractures. Degenerative changes and scoliosis of the thoracolumbar spine. Degenerative changes in the shoulders. IMPRESSION: No evidence of active pulmonary disease. Emphysematous changes and fibrosis in the lungs. Esophageal hiatal hernia behind the heart. Electronically Signed   By: Lucienne Capers M.D.   On: 09/13/2015 03:30   Ct Head Wo Contrast  Result Date: 09/13/2015 CLINICAL DATA:  Status post fall, with forehead hematoma. Concern for cervical spine injury. Initial encounter. EXAM: CT HEAD WITHOUT CONTRAST CT CERVICAL SPINE WITHOUT CONTRAST TECHNIQUE: Multidetector CT imaging of the head and cervical spine was performed following the standard protocol without intravenous contrast. Multiplanar CT image reconstructions of the cervical spine were also generated. COMPARISON:  CT of the head performed 09/14/2014, and CT of the cervical spine performed 08/02/2014 FINDINGS: CT HEAD FINDINGS There is no evidence of acute infarction, mass lesion, or intra- or extra-axial hemorrhage on CT. Prominence of the ventricles and sulci reflects mild to moderate cortical volume loss. Scattered periventricular and subcortical white matter change likely reflects small vessel ischemic microangiopathy. A small chronic infarct is seen at the left cerebellar hemisphere. The brainstem and fourth ventricle are within normal limits. The basal ganglia are unremarkable in appearance. The cerebral hemispheres demonstrate grossly normal gray-white differentiation. No mass effect or midline shift is seen. There is no evidence of fracture; there is minimal nonspecific heterogeneity of visualized osseous structures. The visualized portions of the orbits are within normal limits. The  paranasal sinuses and mastoid air cells are well-aerated. Soft tissue swelling is noted overlying the frontal calvarium. CT CERVICAL SPINE FINDINGS There is no evidence of fracture or subluxation. Vertebral bodies demonstrate normal height and alignment. Intervertebral disc space narrowing is noted at C5-C6, with a small anterior disc osteophyte complex. Prevertebral soft tissues are within normal limits. The visualized neural foramina are grossly unremarkable. The thyroid gland is unremarkable in appearance. The visualized lung apices are clear. Calcification is noted at the carotid bifurcations bilaterally, more prominent on the left. There  is likely mild to moderate luminal narrowing at the proximal left internal carotid artery. IMPRESSION: 1. No evidence of traumatic intracranial injury or fracture. 2. No evidence of fracture or subluxation along the cervical spine. 3. Soft tissue swelling overlying the frontal calvarium. 4. Mild to moderate cortical volume loss and scattered small vessel ischemic microangiopathy. 5. 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 6. Calcification at the carotid bifurcations bilaterally, more prominent on the left. There is likely mild to moderate luminal narrowing at the proximal left internal carotid artery. Carotid ultrasound would be helpful for further evaluation, when and as deemed clinically appropriate. Electronically Signed   By: Garald Balding M.D.   On: 09/13/2015 04:01   Ct Cervical Spine Wo Contrast  Result Date: 09/13/2015 CLINICAL DATA:  Status post fall, with forehead hematoma. Concern for cervical spine injury. Initial encounter. EXAM: CT HEAD WITHOUT CONTRAST CT CERVICAL SPINE WITHOUT CONTRAST TECHNIQUE: Multidetector CT imaging of the head and cervical spine was performed following the standard protocol without intravenous contrast. Multiplanar CT image reconstructions of the cervical spine were also generated. COMPARISON:  CT of the head  performed 09/14/2014, and CT of the cervical spine performed 08/02/2014 FINDINGS: CT HEAD FINDINGS There is no evidence of acute infarction, mass lesion, or intra- or extra-axial hemorrhage on CT. Prominence of the ventricles and sulci reflects mild to moderate cortical volume loss. Scattered periventricular and subcortical white matter change likely reflects small vessel ischemic microangiopathy. A small chronic infarct is seen at the left cerebellar hemisphere. The brainstem and fourth ventricle are within normal limits. The basal ganglia are unremarkable in appearance. The cerebral hemispheres demonstrate grossly normal gray-white differentiation. No mass effect or midline shift is seen. There is no evidence of fracture; there is minimal nonspecific heterogeneity of visualized osseous structures. The visualized portions of the orbits are within normal limits. The paranasal sinuses and mastoid air cells are well-aerated. Soft tissue swelling is noted overlying the frontal calvarium. CT CERVICAL SPINE FINDINGS There is no evidence of fracture or subluxation. Vertebral bodies demonstrate normal height and alignment. Intervertebral disc space narrowing is noted at C5-C6, with a small anterior disc osteophyte complex. Prevertebral soft tissues are within normal limits. The visualized neural foramina are grossly unremarkable. The thyroid gland is unremarkable in appearance. The visualized lung apices are clear. Calcification is noted at the carotid bifurcations bilaterally, more prominent on the left. There is likely mild to moderate luminal narrowing at the proximal left internal carotid artery. IMPRESSION: 1. No evidence of traumatic intracranial injury or fracture. 2. No evidence of fracture or subluxation along the cervical spine. 3. Soft tissue swelling overlying the frontal calvarium. 4. Mild to moderate cortical volume loss and scattered small vessel ischemic microangiopathy. 5. Small chronic infarct at the left  cerebellar hemisphere. 6. Calcification at the carotid bifurcations bilaterally, more prominent on the left. There is likely mild to moderate luminal narrowing at the proximal left internal carotid artery. Carotid ultrasound would be helpful for further evaluation, when and as deemed clinically appropriate. Electronically Signed   By: Garald Balding M.D.   On: 09/13/2015 04:01   Dg Pelvis Portable  Result Date: 09/13/2015 CLINICAL DATA:  Post open reduction internal fixation left intertrochanteric fracture EXAM: PORTABLE PELVIS 1-2 VIEWS COMPARISON:  Intraoperative film same day FINDINGS: Single portable frontal view of the pelvis submitted. Again noted intra medullary rod and compression screw in proximal left femur. There is anatomic alignment. Postsurgical changes are noted with lateral skin staples and periarticular soft tissue  air. Intra medullary rod and compression screw is noted in right femur. IMPRESSION: Again noted intra medullary rod and compression screw in proximal left femur. There is anatomic alignment. Postsurgical changes are noted with lateral skin staples and periarticular soft tissue air. Intra medullary rod and compression screw is noted in right femur. Electronically Signed   By: Lahoma Crocker M.D.   On: 09/13/2015 16:32   Dg Shoulder Left  Result Date: 09/13/2015 CLINICAL DATA:  Left shoulder pain and limited range of motion after a fall. EXAM: LEFT SHOULDER - 2+ VIEW COMPARISON:  None. FINDINGS: Degenerative changes in the acromioclavicular and glenohumeral joints. Diffuse bone demineralization. No evidence of acute fracture or dislocation in the left humerus. Calcification along greater tuberosity may indicate calcific tendinitis. IMPRESSION: Degenerative changes in the left shoulder. No acute fractures. Soft tissue calcifications suggesting calcific tendinitis. Electronically Signed   By: Lucienne Capers M.D.   On: 09/13/2015 03:33   Dg C-arm 61-120 Min  Result Date:  09/13/2015 CLINICAL DATA:  Portable imaging following left intertrochanteric proximal femur fracture ORIF EXAM: LEFT FEMUR 2 VIEWS; DG C-ARM 61-120 MIN COMPARISON:  09/13/2015 at 2:59 a.m. FINDINGS: Images show placement long intra medullary rod supporting a compression screw. The major fracture components have been reduced into near anatomic alignment. No new fracture or evidence of an operative complication. IMPRESSION: Well aligned major fracture components following ORIF of the left proximal femur intertrochanteric fracture. Electronically Signed   By: Lajean Manes M.D.   On: 09/13/2015 15:24   Dg Hip Unilat With Pelvis 2-3 Views Left  Result Date: 09/13/2015 CLINICAL DATA:  Fall with hematoma to the forehead. Left hip pain with shortening and rotation EXAM: DG HIP (WITH OR WITHOUT PELVIS) 2-3V LEFT COMPARISON:  None. FINDINGS: Acute inter trochanteric fracture of the left hip with varus angulation of the fracture fragments. No dislocation at the hip joint. Pelvis appears intact. Degenerative changes in the lower lumbar spine and in both hips. Previous intra medullary rod fixation of the right hip. Vascular calcifications. IMPRESSION: Acute comminuted fracture of the inter trochanteric left hip with varus angulation. Electronically Signed   By: Lucienne Capers M.D.   On: 09/13/2015 03:31   Dg Femur Min 2 Views Left  Result Date: 09/13/2015 CLINICAL DATA:  82 year old female status post left femur surgery for intertrochanteric fracture. Initial encounter. EXAM: LEFT FEMUR 2 VIEWS COMPARISON:  Intraoperative images 1558 hours today. FINDINGS: Portable AP and cross-table lateral views of the left femur. Left femur intra medullary rod placed with proximal interlocking dynamic hip screw and distal interlocking cortical screws. Near anatomic alignment about the intertrochanteric fracture. Hardware appears intact. Left femoral head remains normally located. Overlying postoperative changes to the soft tissues  with subcutaneous gas and skin staples. Iliac and femoral artery calcified atherosclerosis. IMPRESSION: 1. Left femur ORIF with no adverse features. 2. Calcified peripheral vascular disease. Electronically Signed   By: Genevie Ann M.D.   On: 09/13/2015 16:29   Dg Femur Min 2 Views Left  Result Date: 09/13/2015 CLINICAL DATA:  Portable imaging following left intertrochanteric proximal femur fracture ORIF EXAM: LEFT FEMUR 2 VIEWS; DG C-ARM 61-120 MIN COMPARISON:  09/13/2015 at 2:59 a.m. FINDINGS: Images show placement long intra medullary rod supporting a compression screw. The major fracture components have been reduced into near anatomic alignment. No new fracture or evidence of an operative complication. IMPRESSION: Well aligned major fracture components following ORIF of the left proximal femur intertrochanteric fracture. Electronically Signed   By: Lajean Manes  M.D.   On: 09/13/2015 15:24    Assessment/Plan: 1 Day Post-Op   Active Problems:   Hip fracture [facility])  Patient doing well from an orthopedic standpoint postop. Continue to treat constipation with medical bowel regimen. Patient will have labs rechecked in the morning. Possible discharge to rehabilitation medically cleared.    Thornton Park , MD 09/14/2015, 7:55 PM

## 2015-09-14 NOTE — Progress Notes (Signed)
PT Cancellation Note  Patient Details Name: Amanda Stark MRN: [id number] DOB: July 10, 1922   Cancelled Treatment:    Reason Eval/Treat Not Completed: Medical issues which prohibited therapy. Order received, chart reviewed. Spoke with CSW, and per her conversation with pt's family, the family wishes for pt to be seen by PT but under comfortable measures. Eval attempted, but pt with Hgb 7.7 g/dL and RN preparing to begin blood transfusion. Per discussion with RN, will hold PT eval until transfusion is complete. Will re-attempt PT eval at a later date/time.    Paisleigh Maroney, SPT 09/14/2015, 9:20 AM

## 2015-09-14 NOTE — Progress Notes (Signed)
OT Cancellation Note  Patient Details Name: Amanda Stark MRN: [id number] DOB: 1922-11-13   Cancelled Treatment:    Reason Eval/Treat Not Completed: Medical issues which prohibited therapy (Pt. with low hemoglobin of 7.7. Will eval when appropriate.)  Harrel Carina, MS, OTR/L 09/14/2015, 11:44 AM

## 2015-09-14 NOTE — Progress Notes (Signed)
Patient is confused this am, "wanting answers", continues to ask about Dr.

## 2015-09-14 NOTE — Progress Notes (Signed)
Plan is for patient to D/C back to [facility] tomorrow pending medical clearance. Hospice will continue to follow patient. Kim admissions coordinator at [facility] is aware of above. Clinical Social Worker (CSW) will continue to follow and assist as needed.   [employer], LCSW [phone]

## 2015-09-14 NOTE — Progress Notes (Addendum)
Patient BP low, held until BP increases. Order to wean O2, UA collection

## 2015-09-14 NOTE — Progress Notes (Addendum)
Visit made. Patient seen lying in bed, alert, pleasantly interactive, daughter [name] at bedside. [name] reports patient is eating well. She has not required any pain medication today, but has not gotten up as of yet. Plan is for discharge back to [facility] long term care, with NO PLANS for rehab., to continue hospice services.CSW [place] aware. Will continue to follow through final disposition and up date hospice team. Flo Shanks, BSN, [place] [facility] [place], [facility] [phone]

## 2015-09-14 NOTE — Progress Notes (Addendum)
[place] [facility] [place] NAME: Amanda Stark    MR#:  [id number]  DATE OF BIRTH:  Jul 21, 1922  SUBJECTIVE:   POD#1 family in the room getting BT Now new complaints REVIEW OF SYSTEMS:   Review of Systems  Constitutional: Negative for chills, fever and weight loss.  HENT: Negative for ear discharge, ear pain and nosebleeds.   Eyes: Negative for blurred vision, pain and discharge.  Respiratory: Negative for sputum production, shortness of breath, wheezing and stridor.   Cardiovascular: Negative for chest pain, palpitations, orthopnea and PND.  Gastrointestinal: Negative for abdominal pain, diarrhea, nausea and vomiting.  Genitourinary: Negative for frequency and urgency.  Musculoskeletal: Negative for back pain and joint pain.  Neurological: Positive for weakness. Negative for sensory change, speech change and focal weakness.  Psychiatric/Behavioral: Negative for depression and hallucinations. The patient is not nervous/anxious.    Tolerating Diet:yes Tolerating PT: pedning  DRUG ALLERGIES:   Allergies  Allergen Reactions  . Ascriptin A-D [Aspirin Buf(Alhyd-Mghyd-Cacar)] Other (See Comments)    Reaction:  Unknown   . Aspirin Other (See Comments)    Reaction:  Unknown   . Codeine Other (See Comments)    Reaction:  Unknown   . Hydrocodone Other (See Comments)    Reaction:  Unknown   . Statins Other (See Comments)    Reaction:  Muscle pain      VITALS:  Blood pressure (!) 140/59, pulse 80, temperature 98.5 F (36.9 C), temperature source Oral, resp. rate 16, height 5\' 3"  (1.6 m), weight 105 lb (47.6 kg), SpO2 100 %.  PHYSICAL EXAMINATION:   Physical Exam  GENERAL:  82 y.o.-year-old patient lying in the bed with no acute distress.  EYES: Pupils equal, round, reactive to light and accommodation. No scleral icterus. Extraocular muscles intact.  HEENT: Head atraumatic, normocephalic. Oropharynx and nasopharynx clear.  NECK:   Supple, no jugular venous distention. No thyroid enlargement, no tenderness.  LUNGS: Normal breath sounds bilaterally, no wheezing, rales, rhonchi. No use of accessory muscles of respiration.  CARDIOVASCULAR: S1, S2 normal. No murmurs, rubs, or gallops.  ABDOMEN: Soft, nontender, nondistended. Bowel sounds present. No organomegaly or mass.  EXTREMITIES: No cyanosis, clubbing or edema b/l.    NEUROLOGIC: grossly nonfocal PSYCHIATRIC:  patient is alert and oriented x 3.  SKIN: No obvious rash, lesion, or ulcer.   LABORATORY PANEL:  CBC  Recent Labs Lab 09/14/15 0459  WBC 20.1*  HGB 7.7*  HCT 23.6*  PLT 269    Chemistries   Recent Labs Lab 09/14/15 0459  NA 133*  K 4.0  CL 95*  CO2 31  GLUCOSE 145*  BUN 29*  CREATININE 0.70  CALCIUM 8.7*   Cardiac Enzymes  Recent Labs Lab 09/13/15 0250  TROPONINI <0.03   RADIOLOGY:  Dg Chest 1 View  Result Date: 09/13/2015 CLINICAL DATA:  Preoperative chest x-ray.  Left hip fracture. EXAM: CHEST 1 VIEW COMPARISON:  05/07/2015 FINDINGS: Normal heart size and pulmonary vascularity. Emphysematous changes in the lungs. Linear fibrosis in the right lung base. Esophageal hiatal hernia behind the heart. Calcified and tortuous aorta. No focal consolidation. No blunting of costophrenic angles. No pneumothorax. Old right rib fractures. Degenerative changes and scoliosis of the thoracolumbar spine. Degenerative changes in the shoulders. IMPRESSION: No evidence of active pulmonary disease. Emphysematous changes and fibrosis in the lungs. Esophageal hiatal hernia behind the heart. Electronically Signed   By: Lucienne Capers M.D.   On: 09/13/2015 03:30   Ct Head  Wo Contrast  Result Date: 09/13/2015 CLINICAL DATA:  Status post fall, with forehead hematoma. Concern for cervical spine injury. Initial encounter. EXAM: CT HEAD WITHOUT CONTRAST CT CERVICAL SPINE WITHOUT CONTRAST TECHNIQUE: Multidetector CT imaging of the head and cervical spine was  performed following the standard protocol without intravenous contrast. Multiplanar CT image reconstructions of the cervical spine were also generated. COMPARISON:  CT of the head performed 09/14/2014, and CT of the cervical spine performed 08/02/2014 FINDINGS: CT HEAD FINDINGS There is no evidence of acute infarction, mass lesion, or intra- or extra-axial hemorrhage on CT. Prominence of the ventricles and sulci reflects mild to moderate cortical volume loss. Scattered periventricular and subcortical white matter change likely reflects small vessel ischemic microangiopathy. A small chronic infarct is seen at the left cerebellar hemisphere. The brainstem and fourth ventricle are within normal limits. The basal ganglia are unremarkable in appearance. The cerebral hemispheres demonstrate grossly normal gray-white differentiation. No mass effect or midline shift is seen. There is no evidence of fracture; there is minimal nonspecific heterogeneity of visualized osseous structures. The visualized portions of the orbits are within normal limits. The paranasal sinuses and mastoid air cells are well-aerated. Soft tissue swelling is noted overlying the frontal calvarium. CT CERVICAL SPINE FINDINGS There is no evidence of fracture or subluxation. Vertebral bodies demonstrate normal height and alignment. Intervertebral disc space narrowing is noted at C5-C6, with a small anterior disc osteophyte complex. Prevertebral soft tissues are within normal limits. The visualized neural foramina are grossly unremarkable. The thyroid gland is unremarkable in appearance. The visualized lung apices are clear. Calcification is noted at the carotid bifurcations bilaterally, more prominent on the left. There is likely mild to moderate luminal narrowing at the proximal left internal carotid artery. IMPRESSION: 1. No evidence of traumatic intracranial injury or fracture. 2. No evidence of fracture or subluxation along the cervical spine. 3. Soft  tissue swelling overlying the frontal calvarium. 4. Mild to moderate cortical volume loss and scattered small vessel ischemic microangiopathy. 5. 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 6. Calcification at the carotid bifurcations bilaterally, more prominent on the left. There is likely mild to moderate luminal narrowing at the proximal left internal carotid artery. Carotid ultrasound would be helpful for further evaluation, when and as deemed clinically appropriate. Electronically Signed   By: Garald Balding M.D.   On: 09/13/2015 04:01   Ct Cervical Spine Wo Contrast  Result Date: 09/13/2015 CLINICAL DATA:  Status post fall, with forehead hematoma. Concern for cervical spine injury. Initial encounter. EXAM: CT HEAD WITHOUT CONTRAST CT CERVICAL SPINE WITHOUT CONTRAST TECHNIQUE: Multidetector CT imaging of the head and cervical spine was performed following the standard protocol without intravenous contrast. Multiplanar CT image reconstructions of the cervical spine were also generated. COMPARISON:  CT of the head performed 09/14/2014, and CT of the cervical spine performed 08/02/2014 FINDINGS: CT HEAD FINDINGS There is no evidence of acute infarction, mass lesion, or intra- or extra-axial hemorrhage on CT. Prominence of the ventricles and sulci reflects mild to moderate cortical volume loss. Scattered periventricular and subcortical white matter change likely reflects small vessel ischemic microangiopathy. A small chronic infarct is seen at the left cerebellar hemisphere. The brainstem and fourth ventricle are within normal limits. The basal ganglia are unremarkable in appearance. The cerebral hemispheres demonstrate grossly normal gray-white differentiation. No mass effect or midline shift is seen. There is no evidence of fracture; there is minimal nonspecific heterogeneity of visualized osseous structures. The visualized portions of the orbits  are within normal limits. The paranasal sinuses  and mastoid air cells are well-aerated. Soft tissue swelling is noted overlying the frontal calvarium. CT CERVICAL SPINE FINDINGS There is no evidence of fracture or subluxation. Vertebral bodies demonstrate normal height and alignment. Intervertebral disc space narrowing is noted at C5-C6, with a small anterior disc osteophyte complex. Prevertebral soft tissues are within normal limits. The visualized neural foramina are grossly unremarkable. The thyroid gland is unremarkable in appearance. The visualized lung apices are clear. Calcification is noted at the carotid bifurcations bilaterally, more prominent on the left. There is likely mild to moderate luminal narrowing at the proximal left internal carotid artery. IMPRESSION: 1. No evidence of traumatic intracranial injury or fracture. 2. No evidence of fracture or subluxation along the cervical spine. 3. Soft tissue swelling overlying the frontal calvarium. 4. Mild to moderate cortical volume loss and scattered small vessel ischemic microangiopathy. 5. 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 6. Calcification at the carotid bifurcations bilaterally, more prominent on the left. There is likely mild to moderate luminal narrowing at the proximal left internal carotid artery. Carotid ultrasound would be helpful for further evaluation, when and as deemed clinically appropriate. Electronically Signed   By: Garald Balding M.D.   On: 09/13/2015 04:01   Dg Pelvis Portable  Result Date: 09/13/2015 CLINICAL DATA:  Post open reduction internal fixation left intertrochanteric fracture EXAM: PORTABLE PELVIS 1-2 VIEWS COMPARISON:  Intraoperative film same day FINDINGS: Single portable frontal view of the pelvis submitted. Again noted intra medullary rod and compression screw in proximal left femur. There is anatomic alignment. Postsurgical changes are noted with lateral skin staples and periarticular soft tissue air. Intra medullary rod and compression screw  is noted in right femur. IMPRESSION: Again noted intra medullary rod and compression screw in proximal left femur. There is anatomic alignment. Postsurgical changes are noted with lateral skin staples and periarticular soft tissue air. Intra medullary rod and compression screw is noted in right femur. Electronically Signed   By: Lahoma Crocker M.D.   On: 09/13/2015 16:32   Dg Shoulder Left  Result Date: 09/13/2015 CLINICAL DATA:  Left shoulder pain and limited range of motion after a fall. EXAM: LEFT SHOULDER - 2+ VIEW COMPARISON:  None. FINDINGS: Degenerative changes in the acromioclavicular and glenohumeral joints. Diffuse bone demineralization. No evidence of acute fracture or dislocation in the left humerus. Calcification along greater tuberosity may indicate calcific tendinitis. IMPRESSION: Degenerative changes in the left shoulder. No acute fractures. Soft tissue calcifications suggesting calcific tendinitis. Electronically Signed   By: Lucienne Capers M.D.   On: 09/13/2015 03:33   Dg C-arm 61-120 Min  Result Date: 09/13/2015 CLINICAL DATA:  Portable imaging following left intertrochanteric proximal femur fracture ORIF EXAM: LEFT FEMUR 2 VIEWS; DG C-ARM 61-120 MIN COMPARISON:  09/13/2015 at 2:59 a.m. FINDINGS: Images show placement long intra medullary rod supporting a compression screw. The major fracture components have been reduced into near anatomic alignment. No new fracture or evidence of an operative complication. IMPRESSION: Well aligned major fracture components following ORIF of the left proximal femur intertrochanteric fracture. Electronically Signed   By: Lajean Manes M.D.   On: 09/13/2015 15:24   Dg Hip Unilat With Pelvis 2-3 Views Left  Result Date: 09/13/2015 CLINICAL DATA:  Fall with hematoma to the forehead. Left hip pain with shortening and rotation EXAM: DG HIP (WITH OR WITHOUT PELVIS) 2-3V LEFT COMPARISON:  None. FINDINGS: Acute inter trochanteric fracture of the left hip with  varus angulation  of the fracture fragments. No dislocation at the hip joint. Pelvis appears intact. Degenerative changes in the lower lumbar spine and in both hips. Previous intra medullary rod fixation of the right hip. Vascular calcifications. IMPRESSION: Acute comminuted fracture of the inter trochanteric left hip with varus angulation. Electronically Signed   By: Lucienne Capers M.D.   On: 09/13/2015 03:31   Dg Femur Min 2 Views Left  Result Date: 09/13/2015 CLINICAL DATA:  82 year old female status post left femur surgery for intertrochanteric fracture. Initial encounter. EXAM: LEFT FEMUR 2 VIEWS COMPARISON:  Intraoperative images 1558 hours today. FINDINGS: Portable AP and cross-table lateral views of the left femur. Left femur intra medullary rod placed with proximal interlocking dynamic hip screw and distal interlocking cortical screws. Near anatomic alignment about the intertrochanteric fracture. Hardware appears intact. Left femoral head remains normally located. Overlying postoperative changes to the soft tissues with subcutaneous gas and skin staples. Iliac and femoral artery calcified atherosclerosis. IMPRESSION: 1. Left femur ORIF with no adverse features. 2. Calcified peripheral vascular disease. Electronically Signed   By: Genevie Ann M.D.   On: 09/13/2015 16:29   Dg Femur Min 2 Views Left  Result Date: 09/13/2015 CLINICAL DATA:  Portable imaging following left intertrochanteric proximal femur fracture ORIF EXAM: LEFT FEMUR 2 VIEWS; DG C-ARM 61-120 MIN COMPARISON:  09/13/2015 at 2:59 a.m. FINDINGS: Images show placement long intra medullary rod supporting a compression screw. The major fracture components have been reduced into near anatomic alignment. No new fracture or evidence of an operative complication. IMPRESSION: Well aligned major fracture components following ORIF of the left proximal femur intertrochanteric fracture. Electronically Signed   By: Lajean Manes M.D.   On: 09/13/2015  15:24   ASSESSMENT AND PLAN:  82 year old female admitted for hip fracture. 1. Hip fracture: Left intertrochanteric fracture.  -POD#1 -getting BT for post-op anemia -Wbc elevated but no fever and UA negative. Cont to monitor likely reactive - She has not been able to walk for long time but due to dementia recently tries to get out of bed at night and walk to the bathroom.  -PT to  See pt  2. Diabetes mellitus type 2: Continue long-acting insulin adjusted for hospital diet. -resume home dose once po intake better 3. Essential hypertension: Stable 4. SVT: Controlled. Continue sotalol. Check EKG for QT interval 5. Hypothyroidism: Continue Synthroid  6. SIADH: Chronic; continue demeclocycline 7. DVT prophylaxis: SCDs  Case discussed with Care Management/Social Worker. Management plans discussed with the patient, family and they are in agreement.  CODE STATUS: DNR  DVT Prophylaxis: Lovenox TOTAL TIME TAKING CARE OF THIS PATIENT:30 minutes.  >50% time spent on counselling and coordination of care dter and pt  POSSIBLE D/C IN 1 DAYS, DEPENDING ON CLINICAL CONDITION.  Note: This dictation was prepared with Dragon dictation along with smaller phrase technology. Any transcriptional errors that result from this process are unintentional.  Farhan Jean M.D on 09/14/2015 at 4:56 PM  Between 7am to 6pm - Pager - [phone]  After 6pm go to [email or web address] - password [email or web address] [facility]  Office  [phone]  CC: Primary care physician; Pcp Not In System

## 2015-09-14 NOTE — Evaluation (Signed)
Physical Therapy Evaluation Patient Details Name: Amanda Stark MRN: [id number] DOB: Aug 12, 1922 Today's Date: 09/14/2015   History of Present Illness  Pt is a 82 yr old female presenting following a fall at [facility] long term care from which she sustained a L femur intertrochanteric fx. Pt is s/p IMN L femur 8/16.  PMH signficant for IMN R femur 4/17, DM, HTN, SVT, lumbar stenosis, and osteoporosis.   Clinical Impression  Prior to admission, pt was in long term care; non-ambulatory and participating in stand pivot transfers with assist from staff.  Per verbal from MD Amanda Stark, acute care PT services ordered for TherEx, bed mobility, and transfers to BSC/chair; all within patient tolerance/comfort. Currently, pt is max A x2 to achieve EOB, but is able to maintain balance with BUE and close standby assist x 5 minutes.  Pt tolerated small range PROM to LLE, with signs of increased pain with any increase in range (grimicacing, moaning). Pt would benefit from skilled PT while admitted to facilitate increased independence with bed mobility and functional transfers and to prevent deconditioning. Plan to return to long-term care with 24/7 assist and hospice care when medically appropriate.    Follow Up Recommendations Supervision/Assistance - 24 hour    Equipment Recommendations  None recommended by PT (pt owns necessary equipment)    Recommendations for Other Services       Precautions / Restrictions Precautions Precautions: Fall Restrictions Weight Bearing Restrictions: Yes LLE Weight Bearing: Weight bearing as tolerated      Mobility  Bed Mobility Overal bed mobility: Needs Assistance;+2 for physical assistance Bed Mobility: Rolling;Supine to Sit;Sit to Supine Rolling: Max assist Supine to sit: Max assist;+2 for physical assistance Sit to supine: Max assist;+2 for physical assistance General bed mobility comments: Limited by LLE pain. Log roll to the R with max x1, sit <> supine with max  x2. Verbal cues for technique. Pt able to maintain sitting EOB x 5 minutes before asking to lay back down.  Transfers General transfer comment: Deferred 2/2 increased pain with bed mobility  Ambulation/Gait General Gait Details: Pt non-ambulatory at baseline  Stairs    Wheelchair Mobility     Balance Overall balance assessment: Needs assistance Sitting-balance support: Bilateral upper extremity supported Sitting balance-Amanda Stark Scale: Fair Sitting balance - Comments: Pt able to maintain balance sitting EOB with close standby assist x5 minutes. Pt would intermittently lean down onto L elbow, but abruptly return to upright because of increased pain in LLE.     Pertinent Vitals/Pain Pain Assessment: Faces Faces Pain Scale: Hurts even more Pain Location: L femur Pain Intervention(s): Limited activity within patient's tolerance;Monitored during session;Repositioned  HR and O2 monitored throughout session and maintained WFL (on 1L O2 by nasal cannula).     Home Living Family/patient expects to be discharged to:: Skilled nursing facility Additional Comments: Pt receives long term care at [facility]    Prior Function Level of Independence: Needs assistance  Comments: Pt is w/c level at baseline. Per daughter, pt performs stand pivot transfers to/from chair with assist from staff. Pt has h/o attempting to ambulate and falling.         Extremity/Trunk Assessment   Upper Extremity Assessment: Generalized weakness  Lower Extremity Assessment: Generalized weakness  Cervical / Trunk Assessment: Kyphotic    Communication   Communication: HOH  Cognition Arousal/Alertness: Awake/alert Behavior During Therapy: WFL for tasks assessed/performed Overall Cognitive Status: Within Functional Limits for tasks assessed    General Comments Nursing cleared pt for participation in physical  therapy.  Pt and pt's daughter agreeable to PT session.     Exercises General Exercises - Lower  Extremity Ankle Circles/Pumps: AAROM RLE; AAROM LLE Short Arc Quad: AAROM RLE; AAROM LLE Heel Slides: AAROM RLE; PROM LLE (limited range) Hip ABduction/ADduction: AAROM RLE; PROM LLE (limited range) Straight Leg Raises: AAROM RLE; PROM LLE All exercises performed  x 10 reps in supine.       Assessment/Plan    PT Assessment Patient needs continued PT services  PT Diagnosis Difficulty walking;Generalized weakness   PT Problem List Decreased strength;Decreased range of motion;Decreased activity tolerance;Decreased mobility  PT Treatment Interventions Functional mobility training;Therapeutic activities;Therapeutic exercise   PT Goals (Current goals can be found in the Care Plan section) Acute Rehab PT Goals Patient Stated Goal: To go back home PT Goal Formulation: With patient Time For Goal Achievement: 09/28/15 Potential to Achieve Goals: Fair    Frequency BID   Barriers to discharge           End of Session Equipment Utilized During Treatment: Gait belt;Oxygen (1L O2) Activity Tolerance: Patient tolerated treatment well;Patient limited by pain Patient left: in bed;with call bell/phone within reach;with bed alarm set;with family/visitor present;with SCD's reapplied (bilat heels elevated via pillow) Nurse Communication: Mobility status;Precautions;Weight bearing status         Time: [id number] PT Time Calculation (min) (ACUTE ONLY): 26 min   Charges:         PT G Codes:        [name], SPT 09/14/2015, 4:24 PM

## 2015-09-15 LAB — CBC
HEMATOCRIT: 25.2 % — AB (ref 35.0–47.0)
Hemoglobin: 8.4 g/dL — ABNORMAL LOW (ref 12.0–16.0)
MCH: 27.4 pg (ref 26.0–34.0)
MCHC: 33.3 g/dL (ref 32.0–36.0)
MCV: 82.2 fL (ref 80.0–100.0)
Platelets: 227 10*3/uL (ref 150–440)
RBC: 3.06 MIL/uL — ABNORMAL LOW (ref 3.80–5.20)
RDW: 14.9 % — AB (ref 11.5–14.5)
WBC: 17.3 10*3/uL — AB (ref 3.6–11.0)

## 2015-09-15 LAB — BASIC METABOLIC PANEL
ANION GAP: 3 — AB (ref 5–15)
BUN: 23 mg/dL — ABNORMAL HIGH (ref 6–20)
CALCIUM: 8.8 mg/dL — AB (ref 8.9–10.3)
CO2: 33 mmol/L — AB (ref 22–32)
Chloride: 97 mmol/L — ABNORMAL LOW (ref 101–111)
Creatinine, Ser: 0.48 mg/dL (ref 0.44–1.00)
GFR calc Af Amer: >60 mL/min (ref >60)
GFR calc non Af Amer: >60 mL/min (ref >60)
GLUCOSE: 80 mg/dL (ref 65–99)
Potassium: 3.8 mmol/L (ref 3.5–5.1)
Sodium: 133 mmol/L — ABNORMAL LOW (ref 135–145)

## 2015-09-15 LAB — GLUCOSE, CAPILLARY
GLUCOSE-CAPILLARY: 245 mg/dL — AB (ref 65–99)
Glucose-Capillary: 81 mg/dL (ref 65–99)
Glucose-Capillary: 209 mg/dL — ABNORMAL HIGH (ref 65–99)

## 2015-09-15 LAB — TYPE AND SCREEN
ABO/RH(D): A POS
ANTIBODY SCREEN: NEGATIVE
UNIT DIVISION: 0

## 2015-09-15 MED ORDER — ENOXAPARIN SODIUM 30 MG/0.3ML ~~LOC~~ SOLN: 30.0000 mg | SUBCUTANEOUS | Status: DC

## 2015-09-15 MED ORDER — ENOXAPARIN SODIUM 40 MG/0.4ML ~~LOC~~ SOLN: 40.0000 mg | SUBCUTANEOUS | 0 refills | Status: AC

## 2015-09-15 MED ORDER — FERROUS SULFATE 325 (65 FE) MG PO TABS: 325.0000 mg | ORAL_TABLET | Freq: Three times a day (TID) | ORAL | 3 refills | Status: AC

## 2015-09-15 NOTE — Care Management Important Message (Signed)
Important Message  Patient Details  Name: Amanda Stark MRN: [id number] Date of Birth: [birth year]   Medicare Important Message Given:  Yes    Alvie Heidelberg, RN 09/15/2015, 10:15 AM

## 2015-09-15 NOTE — Anesthesia Postprocedure Evaluation (Signed)
Anesthesia Post Note  Patient: Amanda Stark  Procedure(s) Performed: Procedure(s) (LRB): INTRAMEDULLARY (IM) NAIL INTERTROCHANTRIC (Left)  Patient location during evaluation: PACU Anesthesia Type: General Level of consciousness: awake and alert Pain management: pain level controlled Vital Signs Assessment: post-procedure vital signs reviewed and stable Respiratory status: spontaneous breathing, nonlabored ventilation, respiratory function stable and patient connected to nasal cannula oxygen Cardiovascular status: blood pressure returned to baseline and stable Postop Assessment: no signs of nausea or vomiting Anesthetic complications: no    Last Vitals:  Vitals:   09/15/15 0428 09/15/15 0750  BP: (!) 152/57 (!) 174/68  Pulse: 92 93  Resp: 18 16  Temp: 37.4 C 37 C    Last Pain:  Vitals:   09/15/15 0750  TempSrc: Oral  PainSc:                  Molli Barrows

## 2015-09-15 NOTE — Progress Notes (Signed)
EMS arrived at this time to transport to [place], dressing changed per MD order to proximal end of incision.

## 2015-09-15 NOTE — Discharge Summary (Addendum)
[facility] [place] NAME: Amanda Stark    MR#:  [id number]  DATE OF BIRTH:  1922/08/02  DATE OF ADMISSION:  09/13/2015 ADMITTING PHYSICIAN: Harrie Foreman, MD  DATE OF DISCHARGE: 09/15/15  PRIMARY CARE PHYSICIAN: Kirk Ruths., MD    ADMISSION DIAGNOSIS:  Fall [W19.XXXA] Fall, initial encounter [id number]] Hip fracture, left, closed, initial encounter ([place]) [S72.002A]  DISCHARGE DIAGNOSIS:  Left hip Intertrochanteric fracture  SECONDARY DIAGNOSIS:   Past Medical History:  Diagnosis Date  . Diabetes mellitus without complication ([place])   . Glenohumeral arthritis   . Hyperlipidemia   . Hypertension   . Instability of right shoulder joint   . Lumbar spinal stenosis   . Osteoporosis   . PMR (polymyalgia rheumatica) (H[facility])   . SVT (supraventricular tachycardia) ([place])   . Thyroid cancer ([place])   . Thyroid disease   . Vertigo     HOSPITAL COURSE:   82 year old female admitted for hip fracture. 1. Hip fracture: Left intertrochanteric fracture.  -POD#2 -s/p 1 unit BT for post-op anemia. Hgb upto 8.2 -Wbc elevated but no fever and UA negative. Cont to monitor likely reactive -wbc down to 17k - She has not been able to walk for long time but due to dementia but takes few stps to get to her chair -PT eval pending 2. Diabetes mellitus type 2: Continue long-acting insulin adjusted for hospital diet. -resume home dose at d/c3. Essential hypertension: Stable 4. SVT: Controlled. Continue sotalol.  EKG for QT interval is ok 5. Hypothyroidism: Continue Synthroid  6. SIADH: Chronic; continue demeclocycline 7. DVT prophylaxis: SCDs/lovenox (3 weeks pot hip fracture)  D/c to rehab today after seen by PT CONSULTS OBTAINED:  Treatment Team:  Thornton Park, MD  DRUG ALLERGIES:   Allergies  Allergen Reactions  . Ascriptin A-D [Aspirin Buf(Alhyd-Mghyd-Cacar)] Other (See Comments)    Reaction:  Unknown   . Aspirin Other  (See Comments)    Reaction:  Unknown   . Codeine Other (See Comments)    Reaction:  Unknown   . Hydrocodone Other (See Comments)    Reaction:  Unknown   . Statins Other (See Comments)    Reaction:  Muscle pain      DISCHARGE MEDICATIONS:   Current Discharge Medication List    START taking these medications   Details  ferrous sulfate 325 (65 FE) MG tablet Take 1 tablet (325 mg total) by mouth 3 (three) times daily after meals. Qty: 90 tablet, Refills: 3      CONTINUE these medications which have CHANGED   Details  enoxaparin (LOVENOX) 40 MG/0.4ML injection Inject 0.4 mLs (40 mg total) into the skin daily. For 21 days for dvt prophylaxis for hip fracutre Qty: 21 Syringe, Refills: 0      CONTINUE these medications which have NOT CHANGED   Details  acetaminophen (TYLENOL) 325 MG tablet Take 650 mg by mouth 3 (three) times daily.    bisacodyl (DULCOLAX) 10 MG suppository Place 1 suppository (10 mg total) rectally daily as needed for moderate constipation. Qty: 12 suppository, Refills: 0    Calcium Carbonate-Vitamin D (CALCIUM 600+D) 600-400 MG-UNIT tablet Take 1 tablet by mouth 2 (two) times daily.    demeclocycline (DECLOMYCIN) 300 MG tablet Take 450 mg by mouth 2 (two) times daily.    glucagon (GLUCAGEN) 1 MG SOLR injection Inject 1 mg into the muscle once as needed for low blood sugar.    guaifenesin (ROBITUSSIN) 100 MG/5ML syrup Take 200 mg  by mouth every 4 (four) hours as needed for cough.     insulin detemir (LEVEMIR) 100 UNIT/ML injection Inject 0.03 mLs (3 Units total) into the skin 2 (two) times daily. Pt uses 10 units in the morning and 4 units at bedtime. Qty: 10 mL, Refills: 11    levothyroxine (SYNTHROID, LEVOTHROID) 150 MCG tablet Take 150 mcg by mouth daily before breakfast.    lidocaine (LIDODERM) 5 % Place 1 patch onto the skin daily.     LORazepam (ATIVAN) 1 MG tablet Take 1 tablet (1 mg total) by mouth at bedtime. Qty: 30 tablet, Refills: 0     meclizine (ANTIVERT) 25 MG tablet Take 25 mg by mouth 3 (three) times daily as needed for nausea.    metoprolol (LOPRESSOR) 50 MG tablet Take 1 tablet (50 mg total) by mouth 2 (two) times daily. Qty: 1 tablet, Refills: 1    omeprazole (PRILOSEC) 20 MG capsule Take 20 mg by mouth daily.    polyethylene glycol (MIRALAX / GLYCOLAX) packet Take 17 g by mouth every other day.     predniSONE (DELTASONE) 5 MG tablet Take 7.5 mg by mouth daily with breakfast.     senna-docusate (SENOKOT-S) 8.6-50 MG tablet Take 1 tablet by mouth 2 (two) times daily.    sotalol (BETAPACE) 80 MG tablet Take 0.5 tablets (40 mg total) by mouth every 12 (twelve) hours. Qty: 1 tablet, Refills: 1    traMADol (ULTRAM) 50 MG tablet Take by mouth 3 (three) times daily.    amLODipine (NORVASC) 5 MG tablet Take 5 mg by mouth daily.    docusate sodium (COLACE) 100 MG capsule Take 1 capsule (100 mg total) by mouth 2 (two) times daily. Qty: 10 capsule, Refills: 0      STOP taking these medications     oxyCODONE (OXY IR/ROXICODONE) 5 MG immediate release tablet         If you experience worsening of your admission symptoms, develop shortness of breath, life threatening emergency, suicidal or homicidal thoughts you must seek medical attention immediately by calling 911 or calling your MD immediately  if symptoms less severe.  You Must read complete instructions/literature along with all the possible adverse reactions/side effects for all the Medicines you take and that have been prescribed to you. Take any new Medicines after you have completely understood and accept all the possible adverse reactions/side effects.   Please note  You were cared for by a hospitalist during your hospital stay. If you have any questions about your discharge medications or the care you received while you were in the hospital after you are discharged, you can call the unit and asked to speak with the hospitalist on call if the hospitalist  that took care of you is not available. Once you are discharged, your primary care physician will handle any further medical issues. Please note that NO REFILLS for any discharge medications will be authorized once you are discharged, as it is imperative that you return to your primary care physician (or establish a relationship with a primary care physician if you do not have one) for your aftercare needs so that they can reassess your need for medications and monitor your lab values. Today   SUBJECTIVE   Doing ok. No fever. Sugars better  VITAL SIGNS:  Blood pressure (!) 174/68, pulse 93, temperature 98.6 F (37 C), temperature source Oral, resp. rate 16, height 5\' 3"  (1.6 m), weight 105 lb (47.6 kg), SpO2 100 %.  I/O:  Intake/Output Summary (Last 24 hours) at 09/15/15 0831 Last data filed at 09/14/15 1847  Gross per 24 hour  Intake          2476.25 ml  Output                0 ml  Net          2476.25 ml    PHYSICAL EXAMINATION:  GENERAL:  82 y.o.-year-old patient lying in the bed with no acute distress.  EYES: Pupils equal, round, reactive to light and accommodation. No scleral icterus. Extraocular muscles intact.  HEENT: Head atraumatic, normocephalic. Oropharynx and nasopharynx clear.  NECK:  Supple, no jugular venous distention. No thyroid enlargement, no tenderness.  LUNGS: Normal breath sounds bilaterally, no wheezing, rales,rhonchi or crepitation. No use of accessory muscles of respiration.  CARDIOVASCULAR: S1, S2 normal. No murmurs, rubs, or gallops.  ABDOMEN: Soft, non-tender, non-distended. Bowel sounds present. No organomegaly or mass.  EXTREMITIES: No pedal edema, cyanosis, or clubbing. Surgical dressing ok NEUROLOGIC: Cranial nerves II through XII are intact. Muscle strength 5/5 in all extremities. Sensation intact. Gait not checked.  PSYCHIATRIC: The patient is alert and oriented x 3.  SKIN: No obvious rash, lesion, or ulcer.   DATA REVIEW:   CBC   Recent  Labs Lab 09/15/15 0435  WBC 17.3*  HGB 8.4*  HCT 25.2*  PLT 227    Chemistries   Recent Labs Lab 09/15/15 0435  NA 133*  K 3.8  CL 97*  CO2 33*  GLUCOSE 80  BUN 23*  CREATININE 0.48  CALCIUM 8.8*    Microbiology Results   No results found for this or any previous visit (from the past 240 hour(s)).  RADIOLOGY:  Dg Pelvis Portable  Result Date: 09/13/2015 CLINICAL DATA:  Post open reduction internal fixation left intertrochanteric fracture EXAM: PORTABLE PELVIS 1-2 VIEWS COMPARISON:  Intraoperative film same day FINDINGS: Single portable frontal view of the pelvis submitted. Again noted intra medullary rod and compression screw in proximal left femur. There is anatomic alignment. Postsurgical changes are noted with lateral skin staples and periarticular soft tissue air. Intra medullary rod and compression screw is noted in right femur. IMPRESSION: Again noted intra medullary rod and compression screw in proximal left femur. There is anatomic alignment. Postsurgical changes are noted with lateral skin staples and periarticular soft tissue air. Intra medullary rod and compression screw is noted in right femur. Electronically Signed   By: Lahoma Crocker M.D.   On: 09/13/2015 16:32   Dg C-arm 61-120 Min  Result Date: 09/13/2015 CLINICAL DATA:  Portable imaging following left intertrochanteric proximal femur fracture ORIF EXAM: LEFT FEMUR 2 VIEWS; DG C-ARM 61-120 MIN COMPARISON:  09/13/2015 at 2:59 a.m. FINDINGS: Images show placement long intra medullary rod supporting a compression screw. The major fracture components have been reduced into near anatomic alignment. No new fracture or evidence of an operative complication. IMPRESSION: Well aligned major fracture components following ORIF of the left proximal femur intertrochanteric fracture. Electronically Signed   By: Lajean Manes M.D.   On: 09/13/2015 15:24   Dg Femur Min 2 Views Left  Result Date: 09/13/2015 CLINICAL DATA:   82 year old female status post left femur surgery for intertrochanteric fracture. Initial encounter. EXAM: LEFT FEMUR 2 VIEWS COMPARISON:  Intraoperative images 1558 hours today. FINDINGS: Portable AP and cross-table lateral views of the left femur. Left femur intra medullary rod placed with proximal interlocking dynamic hip screw and distal interlocking cortical screws. Near anatomic alignment about the intertrochanteric  fracture. Hardware appears intact. Left femoral head remains normally located. Overlying postoperative changes to the soft tissues with subcutaneous gas and skin staples. Iliac and femoral artery calcified atherosclerosis. IMPRESSION: 1. Left femur ORIF with no adverse features. 2. Calcified peripheral vascular disease. Electronically Signed   By: Genevie Ann M.D.   On: 09/13/2015 16:29   Dg Femur Min 2 Views Left  Result Date: 09/13/2015 CLINICAL DATA:  Portable imaging following left intertrochanteric proximal femur fracture ORIF EXAM: LEFT FEMUR 2 VIEWS; DG C-ARM 61-120 MIN COMPARISON:  09/13/2015 at 2:59 a.m. FINDINGS: Images show placement long intra medullary rod supporting a compression screw. The major fracture components have been reduced into near anatomic alignment. No new fracture or evidence of an operative complication. IMPRESSION: Well aligned major fracture components following ORIF of the left proximal femur intertrochanteric fracture. Electronically Signed   By: Lajean Manes M.D.   On: 09/13/2015 15:24     Management plans discussed with the patient, family and they are in agreement.  CODE STATUS:  Code Status History    Date Active Date Inactive Code Status Order ID Comments User Context   09/13/2015  7:54 AM 09/13/2015  5:07 PM DNR [id number]  Thornton Park, MD Inpatient   09/13/2015  6:31 AM 09/13/2015  7:53 AM DNR [id number]  Harrie Foreman, MD Inpatient   05/03/2015  8:34 PM 05/08/2015  5:37 PM DNR [id number]  Demetrios Loll, MD ED   11/20/2014 11:20 PM 11/23/2014  3:53 PM  DNR [id number]  Lytle Butte, MD ED   11/20/2014 10:44 PM 11/20/2014 11:20 PM Full Code [id number]  Lytle Butte, MD ED   08/02/2014 10:28 AM 08/03/2014  8:37 PM Full Code [id number]  Demetrios Loll, MD ED    Questions for Most Recent Historical Code Status (Order [id number])    Question Answer Comment   In the event of cardiac or respiratory ARREST Do not call a "code blue"    In the event of cardiac or respiratory ARREST Do not perform Intubation, CPR, defibrillation or ACLS    In the event of cardiac or respiratory ARREST Use medication by any route, position, wound care, and other measures to relive pain and suffering. May use oxygen, suction and manual treatment of airway obstruction as needed for comfort.         Advance Directive Documentation   Flowsheet Row Most Recent Value  Type of Advance Directive  Out of facility DNR (pink MOST or yellow form)  Pre-existing out of facility DNR order (yellow form or pink MOST form)  No data  "MOST" Form in Place?  No data      TOTAL TIME TAKING CARE OF THIS PATIENT: 40 minutes.    Kross Swallows M.D on 09/15/2015 at 8:31 AM  Between 7am to 6pm - Pager - [phone] After 6pm go to [email or web address] - password [email or web address] [facility]  Office  [phone]  CC: Primary care physician; Kirk Ruths., MD

## 2015-09-15 NOTE — Discharge Instructions (Signed)
Weight bearing as tolerated Surgical dressing change every week per Ortho MD

## 2015-09-15 NOTE — Progress Notes (Signed)
Subjective:  POD #2 status post cemented fixation for left intertrochanteric hip fracture. Patient confused. Daughter is at the bedside. Patient eating lunch and has no complaints currently.  Objective:   VITALS:   Vitals:   09/14/15 1421 09/14/15 2025 09/15/15 0428 09/15/15 0750  BP: (!) 140/59 104/70 (!) 152/57 (!) 174/68  Pulse: 80 76 92 93  Resp: 16 16 18 16   Temp: 98.5 F (36.9 C) 98.4 F (36.9 C) 99.3 F (37.4 C) 98.6 F (37 C)  TempSrc: Oral Oral Oral Oral  SpO2: 100% 100% 100% 100%  Weight:      Height:        PHYSICAL EXAM:  Left hip: Patient's proximal dressing and sterile dressings drainage. Distal to the images are clean dry and intact. Patient's thigh is no significant swelling. Compartments of the thigh and leg are soft and compressible. She has intact sensation light touch in palpable pedal pulses and intact motor function in the left lower extremity.    LABS  Results for orders placed or performed during the hospital encounter of 09/13/15 (from the past 24 hour(s))  Glucose, capillary     Status: Abnormal   Collection Time: 09/14/15  4:38 PM  Result Value Ref Range   Glucose-Capillary 242 (H) 65 - 99 mg/dL   Comment 1 Notify RN   Glucose, capillary     Status: Abnormal   Collection Time: 09/14/15  9:10 PM  Result Value Ref Range   Glucose-Capillary 245 (H) 65 - 99 mg/dL   Comment 1 Notify RN   CBC     Status: Abnormal   Collection Time: 09/15/15  4:35 AM  Result Value Ref Range   WBC 17.3 (H) 3.6 - 11.0 K/uL   RBC 3.06 (L) 3.80 - 5.20 MIL/uL   Hemoglobin 8.4 (L) 12.0 - 16.0 g/dL   HCT 25.2 (L) 35.0 - 47.0 %   MCV 82.2 80.0 - 100.0 fL   MCH 27.4 26.0 - 34.0 pg   MCHC 33.3 32.0 - 36.0 g/dL   RDW 14.9 (H) 11.5 - 14.5 %   Platelets 227 150 - 440 K/uL  Basic metabolic panel     Status: Abnormal   Collection Time: 09/15/15  4:35 AM  Result Value Ref Range   Sodium 133 (L) 135 - 145 mmol/L   Potassium 3.8 3.5 - 5.1 mmol/L   Chloride 97 (L) 101 -  111 mmol/L   CO2 33 (H) 22 - 32 mmol/L   Glucose, Bld 80 65 - 99 mg/dL   BUN 23 (H) 6 - 20 mg/dL   Creatinine, Ser 0.48 0.44 - 1.00 mg/dL   Calcium 8.8 (L) 8.9 - 10.3 mg/dL   GFR calc non Af Amer >60 >60 mL/min   GFR calc Af Amer >60 >60 mL/min   Anion gap 3 (L) 5 - 15  Glucose, capillary     Status: None   Collection Time: 09/15/15  7:15 AM  Result Value Ref Range   Glucose-Capillary 81 65 - 99 mg/dL  Glucose, capillary     Status: Abnormal   Collection Time: 09/15/15 11:20 AM  Result Value Ref Range   Glucose-Capillary 209 (H) 65 - 99 mg/dL    Dg Pelvis Portable  Result Date: 09/13/2015 CLINICAL DATA:  Post open reduction internal fixation left intertrochanteric fracture EXAM: PORTABLE PELVIS 1-2 VIEWS COMPARISON:  Intraoperative film same day FINDINGS: Single portable frontal view of the pelvis submitted. Again noted intra medullary rod and compression screw in proximal left femur.  There is anatomic alignment. Postsurgical changes are noted with lateral skin staples and periarticular soft tissue air. Intra medullary rod and compression screw is noted in right femur. IMPRESSION: Again noted intra medullary rod and compression screw in proximal left femur. There is anatomic alignment. Postsurgical changes are noted with lateral skin staples and periarticular soft tissue air. Intra medullary rod and compression screw is noted in right femur. Electronically Signed   By: Lahoma Crocker M.D.   On: 09/13/2015 16:32   Dg C-arm 61-120 Min  Result Date: 09/13/2015 CLINICAL DATA:  Portable imaging following left intertrochanteric proximal femur fracture ORIF EXAM: LEFT FEMUR 2 VIEWS; DG C-ARM 61-120 MIN COMPARISON:  09/13/2015 at 2:59 a.m. FINDINGS: Images show placement long intra medullary rod supporting a compression screw. The major fracture components have been reduced into near anatomic alignment. No new fracture or evidence of an operative complication. IMPRESSION: Well aligned major fracture  components following ORIF of the left proximal femur intertrochanteric fracture. Electronically Signed   By: Lajean Manes M.D.   On: 09/13/2015 15:24   Dg Femur Min 2 Views Left  Result Date: 09/13/2015 CLINICAL DATA:  82 year old female status post left femur surgery for intertrochanteric fracture. Initial encounter. EXAM: LEFT FEMUR 2 VIEWS COMPARISON:  Intraoperative images 1558 hours today. FINDINGS: Portable AP and cross-table lateral views of the left femur. Left femur intra medullary rod placed with proximal interlocking dynamic hip screw and distal interlocking cortical screws. Near anatomic alignment about the intertrochanteric fracture. Hardware appears intact. Left femoral head remains normally located. Overlying postoperative changes to the soft tissues with subcutaneous gas and skin staples. Iliac and femoral artery calcified atherosclerosis. IMPRESSION: 1. Left femur ORIF with no adverse features. 2. Calcified peripheral vascular disease. Electronically Signed   By: Genevie Ann M.D.   On: 09/13/2015 16:29   Dg Femur Min 2 Views Left  Result Date: 09/13/2015 CLINICAL DATA:  Portable imaging following left intertrochanteric proximal femur fracture ORIF EXAM: LEFT FEMUR 2 VIEWS; DG C-ARM 61-120 MIN COMPARISON:  09/13/2015 at 2:59 a.m. FINDINGS: Images show placement long intra medullary rod supporting a compression screw. The major fracture components have been reduced into near anatomic alignment. No new fracture or evidence of an operative complication. IMPRESSION: Well aligned major fracture components following ORIF of the left proximal femur intertrochanteric fracture. Electronically Signed   By: Lajean Manes M.D.   On: 09/13/2015 15:24    Assessment/Plan: 2 Days Post-Op   Active Problems:   Hip fracture [facility])  Patient doing well from an orthopedic standpoint. She is being discharged to [facility]. Patient is followed by hospitalist there. Patient may be weightbearing as tolerated on the  left lower extremity. Since she does not ambulate much at baseline physical therapy will not be continued at [facility] per the family's request. The family also requests that she does not have to follow up in the office postoperatively. They will contact me if there are any issues. I explained to the daughter that her staples should be removed in approximately 2 weeks. Her bandages may be changed as needed. Patient's daughter understood and agreed with this plan.    Thornton Park , MD 09/15/2015, 12:46 PM

## 2015-09-15 NOTE — Progress Notes (Signed)
MD ordered enoxaparin 40 mg subcutaneously every 24 hours beginning tomorrow morning. CrCl > 30 mL/min; however patient's body weight is 47.6 kg. Will change to Enoxaparin 30 mg SQ daily  Per policy and consult.    Akshaj Besancon D 09/15/15 11:17 AM

## 2015-09-15 NOTE — Progress Notes (Signed)
Visit made. Patient seen lying in bed, alert, daughter [name] at bedside. Patient reporting some pain in her "bottom". Repositioned with staff HHA. Pain reported to staff RN Anderson Malta, tylenol to be given. Patient was able to feed herself breakfast, appetite fair. Plan is for discharge today back to [facility] long term care. Discharge summary faxed to hospice referral. Hospice team alerted to discharge.  Flo Shanks RN, BSN, [place] [facility] [place], [facility] [phone] c

## 2015-09-15 NOTE — Progress Notes (Signed)
Called report to [place] at [facility], going to Rm [phone]. EMS called for transport

## 2015-09-15 NOTE — Progress Notes (Signed)
Physical Therapy Treatment Patient Details Name: Amanda Stark MRN: [id number] DOB: Jun 09, 1922 Today's Date: 09/15/2015    History of Present Illness Pt is a 82 yr old female presenting following a fall at [facility] long term care from which she sustained a L femur intertrochanteric fx. Pt is s/p IMN L femur 8/16.  PMH signficant for IMN R femur 4/17, DM, HTN, SVT, lumbar stenosis, and osteoporosis.     PT Comments    Pt again requiring max A x2 to achieve sitting EOB secondary to pain and generalized weakness. Pt maintained sitting EOB for ~3 minutes with close supervision.  Unable to progress transfer training secondary to low back pain and pt's request to return to supine. Will re-attempt transfer training as able.    Follow Up Recommendations  Supervision/Assistance - 24 hour     Equipment Recommendations  None recommended by PT (pt owns necessary equipment)    Recommendations for Other Services       Precautions / Restrictions Precautions Precautions: Fall Restrictions Weight Bearing Restrictions: Yes LLE Weight Bearing: Weight bearing as tolerated    Mobility  Bed Mobility Overal bed mobility: Needs Assistance;+2 for physical assistance Bed Mobility: Supine to Sit;Sit to Supine     Supine to sit: Max assist;+2 for physical assistance Sit to supine: Max assist;+2 for physical assistance   General bed mobility comments: Pt participates in transfer with BUE and RLE, but is largely limited by LLE pain and generalized weakness. Vc's for technique.  Transfers General transfer comment: Deferred d/t increasing low back pain sitting EOB.  Ambulation/Gait    Stairs     Wheelchair Mobility      Balance Overall balance assessment: Needs assistance Sitting-balance support: Bilateral upper extremity supported Sitting balance-[name]: Fair Sitting balance - Comments: Pt able to maintain balance sitting EOB with close supervision x3 minutes. Pt leans onto R elbow  to offweight L hip. Does demonstrate ability to sit upright, but almost immediately returns back to rightward lean. After ~3 minutes, pt requests to return to supine d/t LBP.    Cognition Arousal/Alertness: Awake/alert Behavior During Therapy: WFL for tasks assessed/performed Overall Cognitive Status: Within Functional Limits for tasks assessed   Exercises General Exercises - Lower Extremity Ankle Circles/Pumps: AROM RLE; AAROM LLE Short Arc Quad: AROM RLE; AAROM LLE Heel Slides: AAROM RLE; PROM LLE Hip ABduction/ADduction: AAROM RLE; PROM LLE Straight Leg Raises: AAROM RLE; PROM LLE All exercises performed x 10 reps in supine    General Comments Pt resting supine with family present bedside, pleasant and agreeable to therapy.       Pertinent Vitals/Pain Pain Assessment: Faces Faces Pain Scale: Hurts little more Pain Location: L femur Pain Intervention(s): Limited activity within patient's tolerance;Monitored during session;Repositioned  HR and O2 monitored throughout session and maintained WFL (on room air).            PT Goals (current goals can now be found in the care plan section) Acute Rehab PT Goals Patient Stated Goal: To go back home PT Goal Formulation: With patient Time For Goal Achievement: 09/28/15 Potential to Achieve Goals: Fair Progress towards PT goals: Progressing toward goals    Frequency  BID    PT Plan Current plan remains appropriate       End of Session Equipment Utilized During Treatment: Gait belt Activity Tolerance: Patient tolerated treatment well;Patient limited by pain Patient left: in bed;with call bell/phone within reach;with bed alarm set;with family/visitor present;with SCD's reapplied (bilat heels elevated via pillow)  Time: [id number] PT Time Calculation (min) (ACUTE ONLY): 25 min  Charges:                       G Codes:      Masayoshi Couzens, SPT 09/15/2015, 12:54 PM

## 2015-09-15 NOTE — Progress Notes (Signed)
Patient is medically stable for D/C back to [facility]. Per Kim admissions coordinator at [facility] patient will go to room 306. RN will call report at [phone] and arrange EMS for transport. Clinical Education officer, museum (CSW) sent DC orders to [employer] via [employer]. Patient's daughter [name] and son [name] are at bedside and aware of above. [facility] liaison is aware of above. Please reconsult if future social work needs arise. CSW signing off.   [employer], LCSW [phone]

## 2015-09-18 ENCOUNTER — Non-Acute Institutional Stay (SKILLED_NURSING_FACILITY): Payer: Medicare Other | Admitting: Gerontology

## 2015-09-18 DIAGNOSIS — S72002D Fracture of unspecified part of neck of left femur, subsequent encounter for closed fracture with routine healing: Secondary | ICD-10-CM

## 2015-09-18 DIAGNOSIS — E119 Type 2 diabetes mellitus without complications: Secondary | ICD-10-CM | POA: Diagnosis not present

## 2015-09-18 DIAGNOSIS — G8918 Other acute postprocedural pain: Secondary | ICD-10-CM | POA: Diagnosis not present

## 2015-09-19 ENCOUNTER — Non-Acute Institutional Stay (SKILLED_NURSING_FACILITY): Payer: Medicare Other | Admitting: Gerontology

## 2015-09-19 DIAGNOSIS — K5909 Other constipation: Secondary | ICD-10-CM

## 2015-09-19 DIAGNOSIS — G8918 Other acute postprocedural pain: Secondary | ICD-10-CM | POA: Insufficient documentation

## 2015-09-19 LAB — GLUCOSE, CAPILLARY
Glucose-Capillary: 210 mg/dL — ABNORMAL HIGH (ref 65–99)
Glucose-Capillary: 149 mg/dL — ABNORMAL HIGH (ref 65–99)
Glucose-Capillary: 142 mg/dL — ABNORMAL HIGH (ref 65–99)

## 2015-09-19 NOTE — Progress Notes (Signed)
Location:      Place of Service:  SNF (31) Provider:  Toni Arthurs, NP-C  Kirk Ruths., MD  Patient Care Team: Kirk Ruths, MD as PCP - General (Internal Medicine)  Extended Emergency Contact Information Primary Emergency Contact: Karen Chafe Address: [address]          [place], [place] [address] Johnnette Litter of [place] Phone: [phone] Relation: Son Secondary Emergency Contact: Lynch,Kathy Address: [address]          [place], [place] [address] Johnnette Litter of [place] Phone: [phone] Mobile Phone: [phone] Relation: Daughter  Code Status:  dnr Goals of care: Advanced Directive information Advanced Directives 09/13/2015  Does patient have an advance directive? Yes  Type of Advance Directive Out of facility DNR (pink MOST or yellow form)  Does patient want to make changes to advanced directive? -  Copy of advanced directive(s) in chart? -  Would patient like information on creating an advanced directive? -  Pre-existing out of facility DNR order (yellow form or pink MOST form) -     Chief Complaint  Patient presents with  . Follow-up    HPI:  Pt is a 82 y.o. female seen today f/u for pain management . Pt underwent Left IM nailing. Yesterday, she reported pain as mild when laying still, but severe with movement. She had facial grimacing with movement. Today, she is agitated, rolling around on the bed. She is trying to remove her clothes and the dressing. She is crying, complaining that her hip hurts. Night shift reports she was awake all night with agitation, moaning and crying. Family initially was adament about pt not receiving anything stronger than Tylenol or tramadol for pain for fear of addiction and increased confusion. Family was counseled by nursing and they are now agreeable to stronger pain medications d/t severity of the pain symptoms. Family was assured that we would get the pain under control, then back down on the pain  regimen.   Past Medical History:  Diagnosis Date  . Diabetes mellitus without complication ([facility])   . Glenohumeral arthritis   . Hyperlipidemia   . Hypertension   . Instability of right shoulder joint   . Lumbar spinal stenosis   . Osteoporosis   . PMR (polymyalgia rheumatica) ([facility])   . SVT (supraventricular tachycardia) ([facility])   . Thyroid cancer ([facility])   . Thyroid disease   . Vertigo    Past Surgical History:  Procedure Laterality Date  . ABDOMINAL HYSTERECTOMY    . APPENDECTOMY    . INTRAMEDULLARY (IM) NAIL INTERTROCHANTERIC Right 05/04/2015   Procedure: INTRAMEDULLARY (IM) NAIL INTERTROCHANTRIC;  Surgeon: Corky Mull, MD;  Location: [facility] ORS;  Service: Orthopedics;  Laterality: Right;  . INTRAMEDULLARY (IM) NAIL INTERTROCHANTERIC Left 09/13/2015   Procedure: INTRAMEDULLARY (IM) NAIL INTERTROCHANTRIC;  Surgeon: Thornton Park, MD;  Location: [facility] ORS;  Service: Orthopedics;  Laterality: Left;  . kidney stone removal    . right lens implant    . THYROIDECTOMY    . TOTAL THYROIDECTOMY      Allergies  Allergen Reactions  . Ascriptin A-D [Aspirin Buf(Alhyd-Mghyd-Cacar)] Other (See Comments)    Reaction:  Unknown   . Aspirin Other (See Comments)    Reaction:  Unknown   . Codeine Other (See Comments)    Reaction:  Unknown   . Hydrocodone Other (See Comments)    Reaction:  Unknown   . Statins Other (See Comments)    Reaction:  Muscle pain  Medication List       Accurate as of 09/19/15  4:39 PM. Always use your most recent med list.          acetaminophen 325 MG tablet Commonly known as:  TYLENOL Take 650 mg by mouth 3 (three) times daily.   amLODipine 5 MG tablet Commonly known as:  NORVASC Take 5 mg by mouth daily.   bisacodyl 10 MG suppository Commonly known as:  DULCOLAX Place 1 suppository (10 mg total) rectally daily as needed for moderate constipation.   CALCIUM 600+D 600-400 MG-UNIT tablet Generic drug:  Calcium Carbonate-Vitamin D Take 1 tablet  by mouth 2 (two) times daily.   demeclocycline 300 MG tablet Commonly known as:  DECLOMYCIN Take 450 mg by mouth 2 (two) times daily.   docusate sodium 100 MG capsule Commonly known as:  COLACE Take 1 capsule (100 mg total) by mouth 2 (two) times daily.   enoxaparin 40 MG/0.4ML injection Commonly known as:  LOVENOX Inject 0.4 mLs (40 mg total) into the skin daily. For 21 days for dvt prophylaxis for hip fracutre   ferrous sulfate 325 (65 FE) MG tablet Take 1 tablet (325 mg total) by mouth 3 (three) times daily after meals.   glucagon 1 MG Solr injection Commonly known as:  GLUCAGEN Inject 1 mg into the muscle once as needed for low blood sugar.   guaifenesin 100 MG/5ML syrup Commonly known as:  ROBITUSSIN Take 200 mg by mouth every 4 (four) hours as needed for cough.   insulin detemir 100 UNIT/ML injection Commonly known as:  LEVEMIR Inject 0.03 mLs (3 Units total) into the skin 2 (two) times daily. Pt uses 10 units in the morning and 4 units at bedtime.   levothyroxine 150 MCG tablet Commonly known as:  SYNTHROID, LEVOTHROID Take 150 mcg by mouth daily before breakfast.   lidocaine 5 % Commonly known as:  LIDODERM Place 1 patch onto the skin daily.   LORazepam 1 MG tablet Commonly known as:  ATIVAN Take 1 tablet (1 mg total) by mouth at bedtime.   meclizine 25 MG tablet Commonly known as:  ANTIVERT Take 25 mg by mouth 3 (three) times daily as needed for nausea.   metoprolol 50 MG tablet Commonly known as:  LOPRESSOR Take 1 tablet (50 mg total) by mouth 2 (two) times daily.   omeprazole 20 MG capsule Commonly known as:  PRILOSEC Take 20 mg by mouth daily.   polyethylene glycol packet Commonly known as:  MIRALAX / GLYCOLAX Take 17 g by mouth every other day.   predniSONE 5 MG tablet Commonly known as:  DELTASONE Take 7.5 mg by mouth daily with breakfast.   senna-docusate 8.6-50 MG tablet Commonly known as:  Senokot-S Take 1 tablet by mouth 2 (two) times  daily.   sotalol 80 MG tablet Commonly known as:  BETAPACE Take 0.5 tablets (40 mg total) by mouth every 12 (twelve) hours.   traMADol 50 MG tablet Commonly known as:  ULTRAM Take by mouth 3 (three) times daily.       Review of Systems  Unable to perform ROS: Dementia  Constitutional: Negative for activity change, appetite change, chills, diaphoresis and fever.  HENT: Negative for congestion, sneezing, sore throat, trouble swallowing and voice change.   Respiratory: Negative for apnea, cough, choking, chest tightness, shortness of breath and wheezing.   Cardiovascular: Positive for leg swelling. Negative for chest pain and palpitations.  Gastrointestinal: Negative for abdominal distention, abdominal pain, constipation, diarrhea and nausea.  Genitourinary: Negative for difficulty urinating, dysuria, frequency and urgency.  Musculoskeletal: Positive for arthralgias (typical arthritis), gait problem and joint swelling. Negative for back pain and myalgias.  Skin: Positive for pallor and wound. Negative for color change and rash.  Neurological: Negative for dizziness, tremors, syncope, speech difficulty, weakness, numbness and headaches.  Psychiatric/Behavioral: Negative for agitation and behavioral problems.  All other systems reviewed and are negative.    There is no immunization history on file for this patient. There are no preventive care reminders to display for this patient. No flowsheet data found. Functional Status Survey:    There were no vitals filed for this visit. There is no height or weight on file to calculate BMI. Physical Exam  Constitutional: She is oriented to person, place, and time. Vital signs are normal. She appears well-developed and well-nourished. She is active and cooperative. She does not appear ill. No distress.  HENT:  Head: Normocephalic and atraumatic.  Mouth/Throat: Uvula is midline, oropharynx is clear and moist and mucous membranes are normal.  Mucous membranes are not pale, not dry and not cyanotic.  Eyes: Conjunctivae, EOM and lids are normal. Pupils are equal, round, and reactive to light.  Neck: Trachea normal, normal range of motion and full passive range of motion without pain. Neck supple. No JVD present. No tracheal deviation, no edema and no erythema present. No thyromegaly present.  Cardiovascular: Normal rate, regular rhythm, normal heart sounds, intact distal pulses and normal pulses.  Exam reveals no gallop, no distant heart sounds and no friction rub.   No murmur heard. Pulmonary/Chest: Effort normal and breath sounds normal. No accessory muscle usage. No respiratory distress. She has no wheezes. She has no rales. She exhibits no tenderness.  Abdominal: Normal appearance and bowel sounds are normal. She exhibits no distension and no ascites. There is no tenderness. There is no guarding.  Musculoskeletal: She exhibits no edema.       Left hip: She exhibits decreased range of motion, decreased strength, tenderness, swelling and laceration (incision).  Expected osteoarthritis, stiffness  Neurological: She is alert and oriented to person, place, and time. She has normal strength. Disoriented: some confusion.  Skin: Skin is warm and dry. Laceration (Left hip incision) noted. She is not diaphoretic. No cyanosis. There is pallor. Nails show no clubbing.  Psychiatric: She has a normal mood and affect. Her speech is normal and behavior is normal. Judgment and thought content normal. Cognition and memory are normal.  Nursing note and vitals reviewed.   Labs reviewed:  Recent Labs  09/13/15 0250 09/13/15 0922 09/14/15 0459 09/15/15 0435  NA 130*  --  133* 133*  K 4.0  --  4.0 3.8  CL 93*  --  95* 97*  CO2 33*  --  31 33*  GLUCOSE 77  --  145* 80  BUN 37*  --  29* 23*  CREATININE 0.58  --  0.70 0.48  CALCIUM 9.5 9.3 8.7* 8.8*    Recent Labs  11/20/14 2009 01/29/15 1130 05/13/15 1730 09/13/15 0922  AST 23 23 21   --    ALT 18 20 14   --   ALKPHOS 86 62 69  --   BILITOT 0.7 0.6 0.9  --   PROT 7.0 7.6 6.8  --   ALBUMIN 3.3* 3.4* 3.1* 3.1*    Recent Labs  05/22/15 0750 06/13/15 0843 09/13/15 0250 09/14/15 0459 09/15/15 0435  WBC 12.5* 10.4 12.1* 20.1* 17.3*  NEUTROABS 8.8* 6.4 7.5*  --   --  HGB 10.5* 11.3* 11.1* 7.7* 8.4*  HCT 32.0* 33.6* 34.5* 23.6* 25.2*  MCV 86.6 84.6 83.5 83.8 82.2  PLT 522* 364 379 269 227   Lab Results  Component Value Date   TSH 1.562 09/13/2015   Lab Results  Component Value Date   HGBA1C 8.0 (H) 09/13/2015   No results found for: CHOL, HDL, LDLCALC, LDLDIRECT, TRIG, CHOLHDL  Significant Diagnostic Results in last 30 days:  Dg Chest 1 View  Result Date: 09/13/2015 CLINICAL DATA:  Preoperative chest x-ray.  Left hip fracture. EXAM: CHEST 1 VIEW COMPARISON:  05/07/2015 FINDINGS: Normal heart size and pulmonary vascularity. Emphysematous changes in the lungs. Linear fibrosis in the right lung base. Esophageal hiatal hernia behind the heart. Calcified and tortuous aorta. No focal consolidation. No blunting of costophrenic angles. No pneumothorax. Old right rib fractures. Degenerative changes and scoliosis of the thoracolumbar spine. Degenerative changes in the shoulders. IMPRESSION: No evidence of active pulmonary disease. Emphysematous changes and fibrosis in the lungs. Esophageal hiatal hernia behind the heart. Electronically Signed   By: Lucienne Capers M.D.   On: 09/13/2015 03:30   Ct Head Wo Contrast  Result Date: 09/13/2015 CLINICAL DATA:  Status post fall, with forehead hematoma. Concern for cervical spine injury. Initial encounter. EXAM: CT HEAD WITHOUT CONTRAST CT CERVICAL SPINE WITHOUT CONTRAST TECHNIQUE: Multidetector CT imaging of the head and cervical spine was performed following the standard protocol without intravenous contrast. Multiplanar CT image reconstructions of the cervical spine were also generated. COMPARISON:  CT of the head performed 09/14/2014,  and CT of the cervical spine performed 08/02/2014 FINDINGS: CT HEAD FINDINGS There is no evidence of acute infarction, mass lesion, or intra- or extra-axial hemorrhage on CT. Prominence of the ventricles and sulci reflects mild to moderate cortical volume loss. Scattered periventricular and subcortical white matter change likely reflects small vessel ischemic microangiopathy. A small chronic infarct is seen at the left cerebellar hemisphere. The brainstem and fourth ventricle are within normal limits. The basal ganglia are unremarkable in appearance. The cerebral hemispheres demonstrate grossly normal gray-white differentiation. No mass effect or midline shift is seen. There is no evidence of fracture; there is minimal nonspecific heterogeneity of visualized osseous structures. The visualized portions of the orbits are within normal limits. The paranasal sinuses and mastoid air cells are well-aerated. Soft tissue swelling is noted overlying the frontal calvarium. CT CERVICAL SPINE FINDINGS There is no evidence of fracture or subluxation. Vertebral bodies demonstrate normal height and alignment. Intervertebral disc space narrowing is noted at C5-C6, with a small anterior disc osteophyte complex. Prevertebral soft tissues are within normal limits. The visualized neural foramina are grossly unremarkable. The thyroid gland is unremarkable in appearance. The visualized lung apices are clear. Calcification is noted at the carotid bifurcations bilaterally, more prominent on the left. There is likely mild to moderate luminal narrowing at the proximal left internal carotid artery. IMPRESSION: 1. No evidence of traumatic intracranial injury or fracture. 2. No evidence of fracture or subluxation along the cervical spine. 3. Soft tissue swelling overlying the frontal calvarium. 4. Mild to moderate cortical volume loss and scattered small vessel ischemic microangiopathy. 5. 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 6. Calcification at the carotid bifurcations bilaterally, more prominent on the left. There is likely mild to moderate luminal narrowing at the proximal left internal carotid artery. Carotid ultrasound would be helpful for further evaluation, when and as deemed clinically appropriate. Electronically Signed   By: Garald Balding M.D.   On: 09/13/2015  04:01   Ct Cervical Spine Wo Contrast  Result Date: 09/13/2015 CLINICAL DATA:  Status post fall, with forehead hematoma. Concern for cervical spine injury. Initial encounter. EXAM: CT HEAD WITHOUT CONTRAST CT CERVICAL SPINE WITHOUT CONTRAST TECHNIQUE: Multidetector CT imaging of the head and cervical spine was performed following the standard protocol without intravenous contrast. Multiplanar CT image reconstructions of the cervical spine were also generated. COMPARISON:  CT of the head performed 09/14/2014, and CT of the cervical spine performed 08/02/2014 FINDINGS: CT HEAD FINDINGS There is no evidence of acute infarction, mass lesion, or intra- or extra-axial hemorrhage on CT. Prominence of the ventricles and sulci reflects mild to moderate cortical volume loss. Scattered periventricular and subcortical white matter change likely reflects small vessel ischemic microangiopathy. A small chronic infarct is seen at the left cerebellar hemisphere. The brainstem and fourth ventricle are within normal limits. The basal ganglia are unremarkable in appearance. The cerebral hemispheres demonstrate grossly normal gray-white differentiation. No mass effect or midline shift is seen. There is no evidence of fracture; there is minimal nonspecific heterogeneity of visualized osseous structures. The visualized portions of the orbits are within normal limits. The paranasal sinuses and mastoid air cells are well-aerated. Soft tissue swelling is noted overlying the frontal calvarium. CT CERVICAL SPINE FINDINGS There is no evidence of fracture or subluxation. Vertebral  bodies demonstrate normal height and alignment. Intervertebral disc space narrowing is noted at C5-C6, with a small anterior disc osteophyte complex. Prevertebral soft tissues are within normal limits. The visualized neural foramina are grossly unremarkable. The thyroid gland is unremarkable in appearance. The visualized lung apices are clear. Calcification is noted at the carotid bifurcations bilaterally, more prominent on the left. There is likely mild to moderate luminal narrowing at the proximal left internal carotid artery. IMPRESSION: 1. No evidence of traumatic intracranial injury or fracture. 2. No evidence of fracture or subluxation along the cervical spine. 3. Soft tissue swelling overlying the frontal calvarium. 4. Mild to moderate cortical volume loss and scattered small vessel ischemic microangiopathy. 5. 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 6. Calcification at the carotid bifurcations bilaterally, more prominent on the left. There is likely mild to moderate luminal narrowing at the proximal left internal carotid artery. Carotid ultrasound would be helpful for further evaluation, when and as deemed clinically appropriate. Electronically Signed   By: Garald Balding M.D.   On: 09/13/2015 04:01   Dg Pelvis Portable  Result Date: 09/13/2015 CLINICAL DATA:  Post open reduction internal fixation left intertrochanteric fracture EXAM: PORTABLE PELVIS 1-2 VIEWS COMPARISON:  Intraoperative film same day FINDINGS: Single portable frontal view of the pelvis submitted. Again noted intra medullary rod and compression screw in proximal left femur. There is anatomic alignment. Postsurgical changes are noted with lateral skin staples and periarticular soft tissue air. Intra medullary rod and compression screw is noted in right femur. IMPRESSION: Again noted intra medullary rod and compression screw in proximal left femur. There is anatomic alignment. Postsurgical changes are noted with lateral  skin staples and periarticular soft tissue air. Intra medullary rod and compression screw is noted in right femur. Electronically Signed   By: Lahoma Crocker M.D.   On: 09/13/2015 16:32   Dg Shoulder Left  Result Date: 09/13/2015 CLINICAL DATA:  Left shoulder pain and limited range of motion after a fall. EXAM: LEFT SHOULDER - 2+ VIEW COMPARISON:  None. FINDINGS: Degenerative changes in the acromioclavicular and glenohumeral joints. Diffuse bone demineralization. No evidence of acute fracture or dislocation in  the left humerus. Calcification along greater tuberosity may indicate calcific tendinitis. IMPRESSION: Degenerative changes in the left shoulder. No acute fractures. Soft tissue calcifications suggesting calcific tendinitis. Electronically Signed   By: Lucienne Capers M.D.   On: 09/13/2015 03:33   Dg C-arm 61-120 Min  Result Date: 09/13/2015 CLINICAL DATA:  Portable imaging following left intertrochanteric proximal femur fracture ORIF EXAM: LEFT FEMUR 2 VIEWS; DG C-ARM 61-120 MIN COMPARISON:  09/13/2015 at 2:59 a.m. FINDINGS: Images show placement long intra medullary rod supporting a compression screw. The major fracture components have been reduced into near anatomic alignment. No new fracture or evidence of an operative complication. IMPRESSION: Well aligned major fracture components following ORIF of the left proximal femur intertrochanteric fracture. Electronically Signed   By: Lajean Manes M.D.   On: 09/13/2015 15:24   Dg Hip Unilat With Pelvis 2-3 Views Left  Result Date: 09/13/2015 CLINICAL DATA:  Fall with hematoma to the forehead. Left hip pain with shortening and rotation EXAM: DG HIP (WITH OR WITHOUT PELVIS) 2-3V LEFT COMPARISON:  None. FINDINGS: Acute inter trochanteric fracture of the left hip with varus angulation of the fracture fragments. No dislocation at the hip joint. Pelvis appears intact. Degenerative changes in the lower lumbar spine and in both hips. Previous intra medullary  rod fixation of the right hip. Vascular calcifications. IMPRESSION: Acute comminuted fracture of the inter trochanteric left hip with varus angulation. Electronically Signed   By: Lucienne Capers M.D.   On: 09/13/2015 03:31   Dg Femur Min 2 Views Left  Result Date: 09/13/2015 CLINICAL DATA:  82 year old female status post left femur surgery for intertrochanteric fracture. Initial encounter. EXAM: LEFT FEMUR 2 VIEWS COMPARISON:  Intraoperative images 1558 hours today. FINDINGS: Portable AP and cross-table lateral views of the left femur. Left femur intra medullary rod placed with proximal interlocking dynamic hip screw and distal interlocking cortical screws. Near anatomic alignment about the intertrochanteric fracture. Hardware appears intact. Left femoral head remains normally located. Overlying postoperative changes to the soft tissues with subcutaneous gas and skin staples. Iliac and femoral artery calcified atherosclerosis. IMPRESSION: 1. Left femur ORIF with no adverse features. 2. Calcified peripheral vascular disease. Electronically Signed   By: Genevie Ann M.D.   On: 09/13/2015 16:29   Dg Femur Min 2 Views Left  Result Date: 09/13/2015 CLINICAL DATA:  Portable imaging following left intertrochanteric proximal femur fracture ORIF EXAM: LEFT FEMUR 2 VIEWS; DG C-ARM 61-120 MIN COMPARISON:  09/13/2015 at 2:59 a.m. FINDINGS: Images show placement long intra medullary rod supporting a compression screw. The major fracture components have been reduced into near anatomic alignment. No new fracture or evidence of an operative complication. IMPRESSION: Well aligned major fracture components following ORIF of the left proximal femur intertrochanteric fracture. Electronically Signed   By: Lajean Manes M.D.   On: 09/13/2015 15:24    Assessment/Plan 1. Pain, postoperative, acute  Schedule tylenol 650 mg po TID  D/C Scheduled tramadol 50 mg po QID  Oxycodone 5 mg 1 tablet po QID scheduled. OK to hold for  sedation  Oxycodone 5 mg 1-2 tablets po Q 3 hours prn pain    Tramadol 50 mg po Q 4 hours prn  Ice pack QID and prn  2. Constipation  Increase Senna S to 2 tablets BID  Continue miralax   Family/ staff Communication:   Total Time: 30 minutes  Documentation: 15 minutes  Face to Face: 15 minutes  Family/Phone:   Labs/tests ordered:  none  Vikki Ports,  NP-C [facility] [address], [place] [address] Cell Phone (Mon-Fri 8am-5pm):  [phone] On Call:  [phone] & follow prompts after 5pm & weekends Office Phone:  [phone] Office Fax:  [phone]

## 2015-09-19 NOTE — Progress Notes (Signed)
Location:      Place of Service:  SNF (31) Provider:  Toni Arthurs, NP-C  Kirk Ruths., MD  Patient Care Team: Kirk Ruths, MD as PCP - General (Internal Medicine)  Extended Emergency Contact Information Primary Emergency Contact: Karen Chafe Address: [address]          [place], [place] [address] Johnnette Litter of [place] Phone: [phone] Relation: Son Secondary Emergency Contact: Lynch,Kathy Address: [address]          [place], [place] [address] Johnnette Litter of [place] Phone: [phone] Mobile Phone: [phone] Relation: Daughter  Code Status:  dnr Goals of care: Advanced Directive information Advanced Directives 09/13/2015  Does patient have an advance directive? Yes  Type of Advance Directive Out of facility DNR (pink MOST or yellow form)  Does patient want to make changes to advanced directive? -  Copy of advanced directive(s) in chart? -  Would patient like information on creating an advanced directive? -  Pre-existing out of facility DNR order (yellow form or pink MOST form) -     Chief Complaint  Patient presents with  . Hospitalization Follow-up    HPI:  Pt is a 82 y.o. female seen today for a hospital f/u s/p admission from [facility] following fall with subsequent left hip fracture and forehead contusion. Pt had gotten up out of bed during the night to go to the bathroom unassisted and fell in the bathroom. Hit head on floor. ED records show no intracranial issues. Pt underwent Left IM nailing. She reports pain as mild when laying still, but severe with movement. She has facial grimacing with movement.   Past Medical History:  Diagnosis Date  . Diabetes mellitus without complication ([place])   . Glenohumeral arthritis   . Hyperlipidemia   . Hypertension   . Instability of right shoulder joint   . Lumbar spinal stenosis   . Osteoporosis   . PMR (polymyalgia rheumatica) ([facility])   . SVT (supraventricular tachycardia) ([place])   .  Thyroid cancer ([place])   . Thyroid disease   . Vertigo    Past Surgical History:  Procedure Laterality Date  . ABDOMINAL HYSTERECTOMY    . APPENDECTOMY    . INTRAMEDULLARY (IM) NAIL INTERTROCHANTERIC Right 05/04/2015   Procedure: INTRAMEDULLARY (IM) NAIL INTERTROCHANTRIC;  Surgeon: Corky Mull, MD;  Location: [facility] ORS;  Service: Orthopedics;  Laterality: Right;  . INTRAMEDULLARY (IM) NAIL INTERTROCHANTERIC Left 09/13/2015   Procedure: INTRAMEDULLARY (IM) NAIL INTERTROCHANTRIC;  Surgeon: Thornton Park, MD;  Location: [facility] ORS;  Service: Orthopedics;  Laterality: Left;  . kidney stone removal    . right lens implant    . THYROIDECTOMY    . TOTAL THYROIDECTOMY      Allergies  Allergen Reactions  . Ascriptin A-D [Aspirin Buf(Alhyd-Mghyd-Cacar)] Other (See Comments)    Reaction:  Unknown   . Aspirin Other (See Comments)    Reaction:  Unknown   . Codeine Other (See Comments)    Reaction:  Unknown   . Hydrocodone Other (See Comments)    Reaction:  Unknown   . Statins Other (See Comments)    Reaction:  Muscle pain        Medication List       Accurate as of 09/18/15 11:59 PM. Always use your most recent med list.          acetaminophen 325 MG tablet Commonly known as:  TYLENOL Take 650 mg by mouth 3 (three) times daily.   amLODipine 5 MG tablet  Commonly known as:  NORVASC Take 5 mg by mouth daily.   bisacodyl 10 MG suppository Commonly known as:  DULCOLAX Place 1 suppository (10 mg total) rectally daily as needed for moderate constipation.   CALCIUM 600+D 600-400 MG-UNIT tablet Generic drug:  Calcium Carbonate-Vitamin D Take 1 tablet by mouth 2 (two) times daily.   demeclocycline 300 MG tablet Commonly known as:  DECLOMYCIN Take 450 mg by mouth 2 (two) times daily.   docusate sodium 100 MG capsule Commonly known as:  COLACE Take 1 capsule (100 mg total) by mouth 2 (two) times daily.   enoxaparin 40 MG/0.4ML injection Commonly known as:  LOVENOX Inject 0.4 mLs  (40 mg total) into the skin daily. For 21 days for dvt prophylaxis for hip fracutre   ferrous sulfate 325 (65 FE) MG tablet Take 1 tablet (325 mg total) by mouth 3 (three) times daily after meals.   glucagon 1 MG Solr injection Commonly known as:  GLUCAGEN Inject 1 mg into the muscle once as needed for low blood sugar.   guaifenesin 100 MG/5ML syrup Commonly known as:  ROBITUSSIN Take 200 mg by mouth every 4 (four) hours as needed for cough.   insulin detemir 100 UNIT/ML injection Commonly known as:  LEVEMIR Inject 0.03 mLs (3 Units total) into the skin 2 (two) times daily. Pt uses 10 units in the morning and 4 units at bedtime.   levothyroxine 150 MCG tablet Commonly known as:  SYNTHROID, LEVOTHROID Take 150 mcg by mouth daily before breakfast.   lidocaine 5 % Commonly known as:  LIDODERM Place 1 patch onto the skin daily.   LORazepam 1 MG tablet Commonly known as:  ATIVAN Take 1 tablet (1 mg total) by mouth at bedtime.   meclizine 25 MG tablet Commonly known as:  ANTIVERT Take 25 mg by mouth 3 (three) times daily as needed for nausea.   metoprolol 50 MG tablet Commonly known as:  LOPRESSOR Take 1 tablet (50 mg total) by mouth 2 (two) times daily.   omeprazole 20 MG capsule Commonly known as:  PRILOSEC Take 20 mg by mouth daily.   polyethylene glycol packet Commonly known as:  MIRALAX / GLYCOLAX Take 17 g by mouth every other day.   predniSONE 5 MG tablet Commonly known as:  DELTASONE Take 7.5 mg by mouth daily with breakfast.   senna-docusate 8.6-50 MG tablet Commonly known as:  Senokot-S Take 1 tablet by mouth 2 (two) times daily.   sotalol 80 MG tablet Commonly known as:  BETAPACE Take 0.5 tablets (40 mg total) by mouth every 12 (twelve) hours.   traMADol 50 MG tablet Commonly known as:  ULTRAM Take by mouth 3 (three) times daily.       Review of Systems  Unable to perform ROS: Dementia  Constitutional: Negative for activity change, appetite  change, chills, diaphoresis and fever.  HENT: Negative for congestion, sneezing, sore throat, trouble swallowing and voice change.   Respiratory: Negative for apnea, cough, choking, chest tightness, shortness of breath and wheezing.   Cardiovascular: Positive for leg swelling. Negative for chest pain and palpitations.  Gastrointestinal: Negative for abdominal distention, abdominal pain, constipation, diarrhea and nausea.  Genitourinary: Negative for difficulty urinating, dysuria, frequency and urgency.  Musculoskeletal: Positive for arthralgias (typical arthritis), gait problem and joint swelling. Negative for back pain and myalgias.  Skin: Positive for pallor and wound. Negative for color change and rash.  Neurological: Negative for dizziness, tremors, syncope, speech difficulty, weakness, numbness and headaches.  Psychiatric/Behavioral:  Negative for agitation and behavioral problems.  All other systems reviewed and are negative.    There is no immunization history on file for this patient. There are no preventive care reminders to display for this patient. No flowsheet data found. Functional Status Survey:    Vitals:   09/19/15 1618  BP: (!) 153/53  Pulse: 70  Resp: 16  Temp: 98.1 F (36.7 C)  SpO2: 97%   There is no height or weight on file to calculate BMI. Physical Exam  Constitutional: She is oriented to person, place, and time. Vital signs are normal. She appears well-developed and well-nourished. She is active and cooperative. She does not appear ill. No distress.  HENT:  Head: Normocephalic and atraumatic.  Mouth/Throat: Uvula is midline, oropharynx is clear and moist and mucous membranes are normal. Mucous membranes are not pale, not dry and not cyanotic.  Eyes: Conjunctivae, EOM and lids are normal. Pupils are equal, round, and reactive to light.  Neck: Trachea normal, normal range of motion and full passive range of motion without pain. Neck supple. No JVD present. No  tracheal deviation, no edema and no erythema present. No thyromegaly present.  Cardiovascular: Normal rate, regular rhythm, normal heart sounds, intact distal pulses and normal pulses.  Exam reveals no gallop, no distant heart sounds and no friction rub.   No murmur heard. Pulmonary/Chest: Effort normal and breath sounds normal. No accessory muscle usage. No respiratory distress. She has no wheezes. She has no rales. She exhibits no tenderness.  Abdominal: Normal appearance and bowel sounds are normal. She exhibits no distension and no ascites. There is no tenderness. There is no guarding.  Musculoskeletal: She exhibits no edema.       Left hip: She exhibits decreased range of motion, decreased strength, tenderness, swelling and laceration (incision).  Expected osteoarthritis, stiffness  Neurological: She is alert and oriented to person, place, and time. She has normal strength. Disoriented: some confusion.  Skin: Skin is warm and dry. Laceration (Left hip incision) noted. She is not diaphoretic. No cyanosis. There is pallor. Nails show no clubbing.  Psychiatric: She has a normal mood and affect. Her speech is normal and behavior is normal. Judgment and thought content normal. Cognition and memory are normal.  Nursing note and vitals reviewed.   Labs reviewed:  Recent Labs  09/13/15 0250 09/13/15 0922 09/14/15 0459 09/15/15 0435  NA 130*  --  133* 133*  K 4.0  --  4.0 3.8  CL 93*  --  95* 97*  CO2 33*  --  31 33*  GLUCOSE 77  --  145* 80  BUN 37*  --  29* 23*  CREATININE 0.58  --  0.70 0.48  CALCIUM 9.5 9.3 8.7* 8.8*    Recent Labs  11/20/14 2009 01/29/15 1130 05/13/15 1730 09/13/15 0922  AST 23 23 21   --   ALT 18 20 14   --   ALKPHOS 86 62 69  --   BILITOT 0.7 0.6 0.9  --   PROT 7.0 7.6 6.8  --   ALBUMIN 3.3* 3.4* 3.1* 3.1*    Recent Labs  05/22/15 0750 06/13/15 0843 09/13/15 0250 09/14/15 0459 09/15/15 0435  WBC 12.5* 10.4 12.1* 20.1* 17.3*  NEUTROABS 8.8* 6.4  7.5*  --   --   HGB 10.5* 11.3* 11.1* 7.7* 8.4*  HCT 32.0* 33.6* 34.5* 23.6* 25.2*  MCV 86.6 84.6 83.5 83.8 82.2  PLT 522* 364 379 269 227   Lab Results  Component Value Date  TSH 1.562 09/13/2015   Lab Results  Component Value Date   HGBA1C 8.0 (H) 09/13/2015   No results found for: CHOL, HDL, LDLCALC, LDLDIRECT, TRIG, CHOLHDL  Significant Diagnostic Results in last 30 days:  Dg Chest 1 View  Result Date: 09/13/2015 CLINICAL DATA:  Preoperative chest x-ray.  Left hip fracture. EXAM: CHEST 1 VIEW COMPARISON:  05/07/2015 FINDINGS: Normal heart size and pulmonary vascularity. Emphysematous changes in the lungs. Linear fibrosis in the right lung base. Esophageal hiatal hernia behind the heart. Calcified and tortuous aorta. No focal consolidation. No blunting of costophrenic angles. No pneumothorax. Old right rib fractures. Degenerative changes and scoliosis of the thoracolumbar spine. Degenerative changes in the shoulders. IMPRESSION: No evidence of active pulmonary disease. Emphysematous changes and fibrosis in the lungs. Esophageal hiatal hernia behind the heart. Electronically Signed   By: Lucienne Capers M.D.   On: 09/13/2015 03:30   Ct Head Wo Contrast  Result Date: 09/13/2015 CLINICAL DATA:  Status post fall, with forehead hematoma. Concern for cervical spine injury. Initial encounter. EXAM: CT HEAD WITHOUT CONTRAST CT CERVICAL SPINE WITHOUT CONTRAST TECHNIQUE: Multidetector CT imaging of the head and cervical spine was performed following the standard protocol without intravenous contrast. Multiplanar CT image reconstructions of the cervical spine were also generated. COMPARISON:  CT of the head performed 09/14/2014, and CT of the cervical spine performed 08/02/2014 FINDINGS: CT HEAD FINDINGS There is no evidence of acute infarction, mass lesion, or intra- or extra-axial hemorrhage on CT. Prominence of the ventricles and sulci reflects mild to moderate cortical volume loss. Scattered  periventricular and subcortical white matter change likely reflects small vessel ischemic microangiopathy. A small chronic infarct is seen at the left cerebellar hemisphere. The brainstem and fourth ventricle are within normal limits. The basal ganglia are unremarkable in appearance. The cerebral hemispheres demonstrate grossly normal gray-white differentiation. No mass effect or midline shift is seen. There is no evidence of fracture; there is minimal nonspecific heterogeneity of visualized osseous structures. The visualized portions of the orbits are within normal limits. The paranasal sinuses and mastoid air cells are well-aerated. Soft tissue swelling is noted overlying the frontal calvarium. CT CERVICAL SPINE FINDINGS There is no evidence of fracture or subluxation. Vertebral bodies demonstrate normal height and alignment. Intervertebral disc space narrowing is noted at C5-C6, with a small anterior disc osteophyte complex. Prevertebral soft tissues are within normal limits. The visualized neural foramina are grossly unremarkable. The thyroid gland is unremarkable in appearance. The visualized lung apices are clear. Calcification is noted at the carotid bifurcations bilaterally, more prominent on the left. There is likely mild to moderate luminal narrowing at the proximal left internal carotid artery. IMPRESSION: 1. No evidence of traumatic intracranial injury or fracture. 2. No evidence of fracture or subluxation along the cervical spine. 3. Soft tissue swelling overlying the frontal calvarium. 4. Mild to moderate cortical volume loss and scattered small vessel ischemic microangiopathy. 5. 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 6. Calcification at the carotid bifurcations bilaterally, more prominent on the left. There is likely mild to moderate luminal narrowing at the proximal left internal carotid artery. Carotid ultrasound would be helpful for further evaluation, when and as deemed  clinically appropriate. Electronically Signed   By: Garald Balding M.D.   On: 09/13/2015 04:01   Ct Cervical Spine Wo Contrast  Result Date: 09/13/2015 CLINICAL DATA:  Status post fall, with forehead hematoma. Concern for cervical spine injury. Initial encounter. EXAM: CT HEAD WITHOUT CONTRAST CT CERVICAL SPINE WITHOUT  CONTRAST TECHNIQUE: Multidetector CT imaging of the head and cervical spine was performed following the standard protocol without intravenous contrast. Multiplanar CT image reconstructions of the cervical spine were also generated. COMPARISON:  CT of the head performed 09/14/2014, and CT of the cervical spine performed 08/02/2014 FINDINGS: CT HEAD FINDINGS There is no evidence of acute infarction, mass lesion, or intra- or extra-axial hemorrhage on CT. Prominence of the ventricles and sulci reflects mild to moderate cortical volume loss. Scattered periventricular and subcortical white matter change likely reflects small vessel ischemic microangiopathy. A small chronic infarct is seen at the left cerebellar hemisphere. The brainstem and fourth ventricle are within normal limits. The basal ganglia are unremarkable in appearance. The cerebral hemispheres demonstrate grossly normal gray-white differentiation. No mass effect or midline shift is seen. There is no evidence of fracture; there is minimal nonspecific heterogeneity of visualized osseous structures. The visualized portions of the orbits are within normal limits. The paranasal sinuses and mastoid air cells are well-aerated. Soft tissue swelling is noted overlying the frontal calvarium. CT CERVICAL SPINE FINDINGS There is no evidence of fracture or subluxation. Vertebral bodies demonstrate normal height and alignment. Intervertebral disc space narrowing is noted at C5-C6, with a small anterior disc osteophyte complex. Prevertebral soft tissues are within normal limits. The visualized neural foramina are grossly unremarkable. The thyroid gland  is unremarkable in appearance. The visualized lung apices are clear. Calcification is noted at the carotid bifurcations bilaterally, more prominent on the left. There is likely mild to moderate luminal narrowing at the proximal left internal carotid artery. IMPRESSION: 1. No evidence of traumatic intracranial injury or fracture. 2. No evidence of fracture or subluxation along the cervical spine. 3. Soft tissue swelling overlying the frontal calvarium. 4. Mild to moderate cortical volume loss and scattered small vessel ischemic microangiopathy. 5. 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 6. Calcification at the carotid bifurcations bilaterally, more prominent on the left. There is likely mild to moderate luminal narrowing at the proximal left internal carotid artery. Carotid ultrasound would be helpful for further evaluation, when and as deemed clinically appropriate. Electronically Signed   By: Garald Balding M.D.   On: 09/13/2015 04:01   Dg Pelvis Portable  Result Date: 09/13/2015 CLINICAL DATA:  Post open reduction internal fixation left intertrochanteric fracture EXAM: PORTABLE PELVIS 1-2 VIEWS COMPARISON:  Intraoperative film same day FINDINGS: Single portable frontal view of the pelvis submitted. Again noted intra medullary rod and compression screw in proximal left femur. There is anatomic alignment. Postsurgical changes are noted with lateral skin staples and periarticular soft tissue air. Intra medullary rod and compression screw is noted in right femur. IMPRESSION: Again noted intra medullary rod and compression screw in proximal left femur. There is anatomic alignment. Postsurgical changes are noted with lateral skin staples and periarticular soft tissue air. Intra medullary rod and compression screw is noted in right femur. Electronically Signed   By: Lahoma Crocker M.D.   On: 09/13/2015 16:32   Dg Shoulder Left  Result Date: 09/13/2015 CLINICAL DATA:  Left shoulder pain and limited  range of motion after a fall. EXAM: LEFT SHOULDER - 2+ VIEW COMPARISON:  None. FINDINGS: Degenerative changes in the acromioclavicular and glenohumeral joints. Diffuse bone demineralization. No evidence of acute fracture or dislocation in the left humerus. Calcification along greater tuberosity may indicate calcific tendinitis. IMPRESSION: Degenerative changes in the left shoulder. No acute fractures. Soft tissue calcifications suggesting calcific tendinitis. Electronically Signed   By: Oren Beckmann.D.  On: 09/13/2015 03:33   Dg C-arm 61-120 Min  Result Date: 09/13/2015 CLINICAL DATA:  Portable imaging following left intertrochanteric proximal femur fracture ORIF EXAM: LEFT FEMUR 2 VIEWS; DG C-ARM 61-120 MIN COMPARISON:  09/13/2015 at 2:59 a.m. FINDINGS: Images show placement long intra medullary rod supporting a compression screw. The major fracture components have been reduced into near anatomic alignment. No new fracture or evidence of an operative complication. IMPRESSION: Well aligned major fracture components following ORIF of the left proximal femur intertrochanteric fracture. Electronically Signed   By: Lajean Manes M.D.   On: 09/13/2015 15:24   Dg Hip Unilat With Pelvis 2-3 Views Left  Result Date: 09/13/2015 CLINICAL DATA:  Fall with hematoma to the forehead. Left hip pain with shortening and rotation EXAM: DG HIP (WITH OR WITHOUT PELVIS) 2-3V LEFT COMPARISON:  None. FINDINGS: Acute inter trochanteric fracture of the left hip with varus angulation of the fracture fragments. No dislocation at the hip joint. Pelvis appears intact. Degenerative changes in the lower lumbar spine and in both hips. Previous intra medullary rod fixation of the right hip. Vascular calcifications. IMPRESSION: Acute comminuted fracture of the inter trochanteric left hip with varus angulation. Electronically Signed   By: Lucienne Capers M.D.   On: 09/13/2015 03:31   Dg Femur Min 2 Views Left  Result Date:  09/13/2015 CLINICAL DATA:  82 year old female status post left femur surgery for intertrochanteric fracture. Initial encounter. EXAM: LEFT FEMUR 2 VIEWS COMPARISON:  Intraoperative images 1558 hours today. FINDINGS: Portable AP and cross-table lateral views of the left femur. Left femur intra medullary rod placed with proximal interlocking dynamic hip screw and distal interlocking cortical screws. Near anatomic alignment about the intertrochanteric fracture. Hardware appears intact. Left femoral head remains normally located. Overlying postoperative changes to the soft tissues with subcutaneous gas and skin staples. Iliac and femoral artery calcified atherosclerosis. IMPRESSION: 1. Left femur ORIF with no adverse features. 2. Calcified peripheral vascular disease. Electronically Signed   By: Genevie Ann M.D.   On: 09/13/2015 16:29   Dg Femur Min 2 Views Left  Result Date: 09/13/2015 CLINICAL DATA:  Portable imaging following left intertrochanteric proximal femur fracture ORIF EXAM: LEFT FEMUR 2 VIEWS; DG C-ARM 61-120 MIN COMPARISON:  09/13/2015 at 2:59 a.m. FINDINGS: Images show placement long intra medullary rod supporting a compression screw. The major fracture components have been reduced into near anatomic alignment. No new fracture or evidence of an operative complication. IMPRESSION: Well aligned major fracture components following ORIF of the left proximal femur intertrochanteric fracture. Electronically Signed   By: Lajean Manes M.D.   On: 09/13/2015 15:24    Assessment/Plan 1. Hip fracture, left, closed, with routine healing, subsequent encounter  Hip precautions  Ice pack as listed below   Pain control as listed below  Monitor closely for needs  Suture removal at 10 days  Fu with surgeon as instructed  2. Pain, postoperative, acute  Schedule tylenol 650 mg po TID  Schedule tramadol 50 mg po QID  Tramadol 50 mg po Q 4 hours prn  Ice pack QID and prn  Family/ staff Communication:    Total Time: 30 minutes  Documentation: 15 minutes  Face to Face: 15 minutes  Family/Phone:   Labs/tests ordered:  none  Vikki Ports, NP-C [facility] [address], [place] [address] Cell Phone (Mon-Fri 8am-5pm):  [phone] On Call:  [phone] & follow prompts after 5pm & weekends Office Phone:  [phone] Office  Fax:  [phone]

## 2015-09-20 ENCOUNTER — Non-Acute Institutional Stay (SKILLED_NURSING_FACILITY): Payer: Medicare Other | Admitting: Gerontology

## 2015-09-20 DIAGNOSIS — G8918 Other acute postprocedural pain: Secondary | ICD-10-CM | POA: Diagnosis not present

## 2015-09-25 NOTE — Progress Notes (Signed)
Location:      Place of Service:  SNF (31) Provider:  Toni Arthurs, NP-C  Kirk Ruths., MD  Patient Care Team: Kirk Ruths, MD as PCP - General (Internal Medicine)  Extended Emergency Contact Information Primary Emergency Contact: Karen Chafe Address: [address]          [place], [place] [address] Johnnette Litter of [place] Phone: [phone] Relation: Son Secondary Emergency Contact: Lynch,Kathy Address: [address]          [place], [place] [address] Johnnette Litter of [place] Phone: [phone] Mobile Phone: [phone] Relation: Daughter  Code Status:  dnr Goals of care: Advanced Directive information Advanced Directives 09/13/2015  Does patient have an advance directive? Yes  Type of Advance Directive Out of facility DNR (pink MOST or yellow form)  Does patient want to make changes to advanced directive? -  Copy of advanced directive(s) in chart? -  Would patient like information on creating an advanced directive? -  Pre-existing out of facility DNR order (yellow form or pink MOST form) -     Chief Complaint  Patient presents with  . Follow-up    HPI:  Pt is a 82 y.o. female seen today f/u for pain management . Pt underwent Left IM nailing. Earlier this week, she reported pain as mild when laying still, but severe with movement. She had facial grimacing with movement. Today, she is agitated, rolling around on the bed. She is trying to remove her clothes and the dressing. She is crying, complaining that her hip hurts. Night shift reports she was awake all night with agitation, moaning and crying. Family initially was adament about pt not receiving anything stronger than Tylenol or tramadol for pain for fear of addiction and increased confusion. Family was counseled by nursing and they are now agreeable to stronger pain medications d/t severity of the pain symptoms. Family was assured that we would get the pain under control, then back down on  the pain regimen. Now, pt is resting quietly. No reports of increased confusion. Pt reports her pain is being well controlled on current regimen. No sedation observed.  Past Medical History:  Diagnosis Date  . Diabetes mellitus without complication ([place])   . Glenohumeral arthritis   . Hyperlipidemia   . Hypertension   . Instability of right shoulder joint   . Lumbar spinal stenosis   . Osteoporosis   . PMR (polymyalgia rheumatica) ([facility])   . SVT (supraventricular tachycardia) ([place])   . Thyroid cancer ([place])   . Thyroid disease   . Vertigo    Past Surgical History:  Procedure Laterality Date  . ABDOMINAL HYSTERECTOMY    . APPENDECTOMY    . INTRAMEDULLARY (IM) NAIL INTERTROCHANTERIC Right 05/04/2015   Procedure: INTRAMEDULLARY (IM) NAIL INTERTROCHANTRIC;  Surgeon: Corky Mull, MD;  Location: [facility] ORS;  Service: Orthopedics;  Laterality: Right;  . INTRAMEDULLARY (IM) NAIL INTERTROCHANTERIC Left 09/13/2015   Procedure: INTRAMEDULLARY (IM) NAIL INTERTROCHANTRIC;  Surgeon: Thornton Park, MD;  Location: [facility] ORS;  Service: Orthopedics;  Laterality: Left;  . kidney stone removal    . right lens implant    . THYROIDECTOMY    . TOTAL THYROIDECTOMY      Allergies  Allergen Reactions  . Ascriptin A-D [Aspirin Buf(Alhyd-Mghyd-Cacar)] Other (See Comments)    Reaction:  Unknown   . Aspirin Other (See Comments)    Reaction:  Unknown   . Codeine Other (See Comments)    Reaction:  Unknown   . Hydrocodone Other (See  Comments)    Reaction:  Unknown   . Statins Other (See Comments)    Reaction:  Muscle pain        Medication List       Accurate as of 09/20/15 11:59 PM. Always use your most recent med list.          acetaminophen 325 MG tablet Commonly known as:  TYLENOL Take 650 mg by mouth 3 (three) times daily.   amLODipine 5 MG tablet Commonly known as:  NORVASC Take 5 mg by mouth daily.   bisacodyl 10 MG suppository Commonly known as:  DULCOLAX Place 1 suppository (10 mg  total) rectally daily as needed for moderate constipation.   CALCIUM 600+D 600-400 MG-UNIT tablet Generic drug:  Calcium Carbonate-Vitamin D Take 1 tablet by mouth 2 (two) times daily.   demeclocycline 300 MG tablet Commonly known as:  DECLOMYCIN Take 450 mg by mouth 2 (two) times daily.   docusate sodium 100 MG capsule Commonly known as:  COLACE Take 1 capsule (100 mg total) by mouth 2 (two) times daily.   enoxaparin 40 MG/0.4ML injection Commonly known as:  LOVENOX Inject 0.4 mLs (40 mg total) into the skin daily. For 21 days for dvt prophylaxis for hip fracutre   ferrous sulfate 325 (65 FE) MG tablet Take 1 tablet (325 mg total) by mouth 3 (three) times daily after meals.   glucagon 1 MG Solr injection Commonly known as:  GLUCAGEN Inject 1 mg into the muscle once as needed for low blood sugar.   guaifenesin 100 MG/5ML syrup Commonly known as:  ROBITUSSIN Take 200 mg by mouth every 4 (four) hours as needed for cough.   insulin detemir 100 UNIT/ML injection Commonly known as:  LEVEMIR Inject 0.03 mLs (3 Units total) into the skin 2 (two) times daily. Pt uses 10 units in the morning and 4 units at bedtime.   levothyroxine 150 MCG tablet Commonly known as:  SYNTHROID, LEVOTHROID Take 150 mcg by mouth daily before breakfast.   lidocaine 5 % Commonly known as:  LIDODERM Place 1 patch onto the skin daily.   LORazepam 1 MG tablet Commonly known as:  ATIVAN Take 1 tablet (1 mg total) by mouth at bedtime.   meclizine 25 MG tablet Commonly known as:  ANTIVERT Take 25 mg by mouth 3 (three) times daily as needed for nausea.   metoprolol 50 MG tablet Commonly known as:  LOPRESSOR Take 1 tablet (50 mg total) by mouth 2 (two) times daily.   omeprazole 20 MG capsule Commonly known as:  PRILOSEC Take 20 mg by mouth daily.   polyethylene glycol packet Commonly known as:  MIRALAX / GLYCOLAX Take 17 g by mouth every other day.   predniSONE 5 MG tablet Commonly known as:   DELTASONE Take 7.5 mg by mouth daily with breakfast.   senna-docusate 8.6-50 MG tablet Commonly known as:  Senokot-S Take 1 tablet by mouth 2 (two) times daily.   sotalol 80 MG tablet Commonly known as:  BETAPACE Take 0.5 tablets (40 mg total) by mouth every 12 (twelve) hours.   traMADol 50 MG tablet Commonly known as:  ULTRAM Take by mouth 3 (three) times daily.       Review of Systems  Unable to perform ROS: Dementia  Constitutional: Negative for activity change, appetite change, chills, diaphoresis and fever.  HENT: Negative for congestion, sneezing, sore throat, trouble swallowing and voice change.   Respiratory: Negative for apnea, cough, choking, chest tightness, shortness of breath and  wheezing.   Cardiovascular: Positive for leg swelling. Negative for chest pain and palpitations.  Gastrointestinal: Negative for abdominal distention, abdominal pain, constipation, diarrhea and nausea.  Genitourinary: Negative for difficulty urinating, dysuria, frequency and urgency.  Musculoskeletal: Positive for arthralgias (typical arthritis), gait problem and joint swelling. Negative for back pain and myalgias.  Skin: Positive for pallor and wound. Negative for color change and rash.  Neurological: Negative for dizziness, tremors, syncope, speech difficulty, weakness, numbness and headaches.  Psychiatric/Behavioral: Negative for agitation and behavioral problems.  All other systems reviewed and are negative.    There is no immunization history on file for this patient. Pertinent  Health Maintenance Due  Topic Date Due  . DEXA SCAN  02/02/1989  . PNA vac Low Risk Adult (1 of 2 - PCV13) 02/02/1989  . INFLUENZA VACCINE  08/29/2015   No flowsheet data found. Functional Status Survey:    There were no vitals filed for this visit. There is no height or weight on file to calculate BMI. Physical Exam  Constitutional: She is oriented to person, place, and time. Vital signs are normal.  She appears well-developed and well-nourished. She is active and cooperative. She does not appear ill. No distress.  HENT:  Head: Normocephalic and atraumatic.  Mouth/Throat: Uvula is midline, oropharynx is clear and moist and mucous membranes are normal. Mucous membranes are not pale, not dry and not cyanotic.  Eyes: Conjunctivae, EOM and lids are normal. Pupils are equal, round, and reactive to light.  Neck: Trachea normal, normal range of motion and full passive range of motion without pain. Neck supple. No JVD present. No tracheal deviation, no edema and no erythema present. No thyromegaly present.  Cardiovascular: Normal rate, regular rhythm, normal heart sounds, intact distal pulses and normal pulses.  Exam reveals no gallop, no distant heart sounds and no friction rub.   No murmur heard. Pulmonary/Chest: Effort normal and breath sounds normal. No accessory muscle usage. No respiratory distress. She has no wheezes. She has no rales. She exhibits no tenderness.  Abdominal: Normal appearance and bowel sounds are normal. She exhibits no distension and no ascites. There is no tenderness. There is no guarding.  Musculoskeletal: She exhibits no edema.       Left hip: She exhibits decreased range of motion, decreased strength, tenderness, swelling and laceration (incision).  Expected osteoarthritis, stiffness  Neurological: She is alert and oriented to person, place, and time. She has normal strength. Disoriented: some confusion.  Skin: Skin is warm and dry. Laceration (Left hip incision) noted. She is not diaphoretic. No cyanosis. There is pallor. Nails show no clubbing.  Psychiatric: She has a normal mood and affect. Her speech is normal and behavior is normal. Judgment and thought content normal. Cognition and memory are normal.  Nursing note and vitals reviewed.   Labs reviewed:  Recent Labs  09/13/15 0250 09/13/15 0922 09/14/15 0459 09/15/15 0435  NA 130*  --  133* 133*  K 4.0  --   4.0 3.8  CL 93*  --  95* 97*  CO2 33*  --  31 33*  GLUCOSE 77  --  145* 80  BUN 37*  --  29* 23*  CREATININE 0.58  --  0.70 0.48  CALCIUM 9.5 9.3 8.7* 8.8*    Recent Labs  11/20/14 2009 01/29/15 1130 05/13/15 1730 09/13/15 0922  AST 23 23 21   --   ALT 18 20 14   --   ALKPHOS 86 62 69  --   BILITOT 0.7  0.6 0.9  --   PROT 7.0 7.6 6.8  --   ALBUMIN 3.3* 3.4* 3.1* 3.1*    Recent Labs  05/22/15 0750 06/13/15 0843 09/13/15 0250 09/14/15 0459 09/15/15 0435  WBC 12.5* 10.4 12.1* 20.1* 17.3*  NEUTROABS 8.8* 6.4 7.5*  --   --   HGB 10.5* 11.3* 11.1* 7.7* 8.4*  HCT 32.0* 33.6* 34.5* 23.6* 25.2*  MCV 86.6 84.6 83.5 83.8 82.2  PLT 522* 364 379 269 227   Lab Results  Component Value Date   TSH 1.562 09/13/2015   Lab Results  Component Value Date   HGBA1C 8.0 (H) 09/13/2015   No results found for: CHOL, HDL, LDLCALC, LDLDIRECT, TRIG, CHOLHDL  Significant Diagnostic Results in last 30 days:  Dg Chest 1 View  Result Date: 09/13/2015 CLINICAL DATA:  Preoperative chest x-ray.  Left hip fracture. EXAM: CHEST 1 VIEW COMPARISON:  05/07/2015 FINDINGS: Normal heart size and pulmonary vascularity. Emphysematous changes in the lungs. Linear fibrosis in the right lung base. Esophageal hiatal hernia behind the heart. Calcified and tortuous aorta. No focal consolidation. No blunting of costophrenic angles. No pneumothorax. Old right rib fractures. Degenerative changes and scoliosis of the thoracolumbar spine. Degenerative changes in the shoulders. IMPRESSION: No evidence of active pulmonary disease. Emphysematous changes and fibrosis in the lungs. Esophageal hiatal hernia behind the heart. Electronically Signed   By: Lucienne Capers M.D.   On: 09/13/2015 03:30   Ct Head Wo Contrast  Result Date: 09/13/2015 CLINICAL DATA:  Status post fall, with forehead hematoma. Concern for cervical spine injury. Initial encounter. EXAM: CT HEAD WITHOUT CONTRAST CT CERVICAL SPINE WITHOUT CONTRAST TECHNIQUE:  Multidetector CT imaging of the head and cervical spine was performed following the standard protocol without intravenous contrast. Multiplanar CT image reconstructions of the cervical spine were also generated. COMPARISON:  CT of the head performed 09/14/2014, and CT of the cervical spine performed 08/02/2014 FINDINGS: CT HEAD FINDINGS There is no evidence of acute infarction, mass lesion, or intra- or extra-axial hemorrhage on CT. Prominence of the ventricles and sulci reflects mild to moderate cortical volume loss. Scattered periventricular and subcortical white matter change likely reflects small vessel ischemic microangiopathy. A small chronic infarct is seen at the left cerebellar hemisphere. The brainstem and fourth ventricle are within normal limits. The basal ganglia are unremarkable in appearance. The cerebral hemispheres demonstrate grossly normal gray-white differentiation. No mass effect or midline shift is seen. There is no evidence of fracture; there is minimal nonspecific heterogeneity of visualized osseous structures. The visualized portions of the orbits are within normal limits. The paranasal sinuses and mastoid air cells are well-aerated. Soft tissue swelling is noted overlying the frontal calvarium. CT CERVICAL SPINE FINDINGS There is no evidence of fracture or subluxation. Vertebral bodies demonstrate normal height and alignment. Intervertebral disc space narrowing is noted at C5-C6, with a small anterior disc osteophyte complex. Prevertebral soft tissues are within normal limits. The visualized neural foramina are grossly unremarkable. The thyroid gland is unremarkable in appearance. The visualized lung apices are clear. Calcification is noted at the carotid bifurcations bilaterally, more prominent on the left. There is likely mild to moderate luminal narrowing at the proximal left internal carotid artery. IMPRESSION: 1. No evidence of traumatic intracranial injury or fracture. 2. No evidence  of fracture or subluxation along the cervical spine. 3. Soft tissue swelling overlying the frontal calvarium. 4. Mild to moderate cortical volume loss and scattered small vessel ischemic microangiopathy. 5. 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  6. Calcification at the carotid bifurcations bilaterally, more prominent on the left. There is likely mild to moderate luminal narrowing at the proximal left internal carotid artery. Carotid ultrasound would be helpful for further evaluation, when and as deemed clinically appropriate. Electronically Signed   By: Garald Balding M.D.   On: 09/13/2015 04:01   Ct Cervical Spine Wo Contrast  Result Date: 09/13/2015 CLINICAL DATA:  Status post fall, with forehead hematoma. Concern for cervical spine injury. Initial encounter. EXAM: CT HEAD WITHOUT CONTRAST CT CERVICAL SPINE WITHOUT CONTRAST TECHNIQUE: Multidetector CT imaging of the head and cervical spine was performed following the standard protocol without intravenous contrast. Multiplanar CT image reconstructions of the cervical spine were also generated. COMPARISON:  CT of the head performed 09/14/2014, and CT of the cervical spine performed 08/02/2014 FINDINGS: CT HEAD FINDINGS There is no evidence of acute infarction, mass lesion, or intra- or extra-axial hemorrhage on CT. Prominence of the ventricles and sulci reflects mild to moderate cortical volume loss. Scattered periventricular and subcortical white matter change likely reflects small vessel ischemic microangiopathy. A small chronic infarct is seen at the left cerebellar hemisphere. The brainstem and fourth ventricle are within normal limits. The basal ganglia are unremarkable in appearance. The cerebral hemispheres demonstrate grossly normal gray-white differentiation. No mass effect or midline shift is seen. There is no evidence of fracture; there is minimal nonspecific heterogeneity of visualized osseous structures. The visualized portions of  the orbits are within normal limits. The paranasal sinuses and mastoid air cells are well-aerated. Soft tissue swelling is noted overlying the frontal calvarium. CT CERVICAL SPINE FINDINGS There is no evidence of fracture or subluxation. Vertebral bodies demonstrate normal height and alignment. Intervertebral disc space narrowing is noted at C5-C6, with a small anterior disc osteophyte complex. Prevertebral soft tissues are within normal limits. The visualized neural foramina are grossly unremarkable. The thyroid gland is unremarkable in appearance. The visualized lung apices are clear. Calcification is noted at the carotid bifurcations bilaterally, more prominent on the left. There is likely mild to moderate luminal narrowing at the proximal left internal carotid artery. IMPRESSION: 1. No evidence of traumatic intracranial injury or fracture. 2. No evidence of fracture or subluxation along the cervical spine. 3. Soft tissue swelling overlying the frontal calvarium. 4. Mild to moderate cortical volume loss and scattered small vessel ischemic microangiopathy. 5. 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 6. Calcification at the carotid bifurcations bilaterally, more prominent on the left. There is likely mild to moderate luminal narrowing at the proximal left internal carotid artery. Carotid ultrasound would be helpful for further evaluation, when and as deemed clinically appropriate. Electronically Signed   By: Garald Balding M.D.   On: 09/13/2015 04:01   Dg Pelvis Portable  Result Date: 09/13/2015 CLINICAL DATA:  Post open reduction internal fixation left intertrochanteric fracture EXAM: PORTABLE PELVIS 1-2 VIEWS COMPARISON:  Intraoperative film same day FINDINGS: Single portable frontal view of the pelvis submitted. Again noted intra medullary rod and compression screw in proximal left femur. There is anatomic alignment. Postsurgical changes are noted with lateral skin staples and periarticular  soft tissue air. Intra medullary rod and compression screw is noted in right femur. IMPRESSION: Again noted intra medullary rod and compression screw in proximal left femur. There is anatomic alignment. Postsurgical changes are noted with lateral skin staples and periarticular soft tissue air. Intra medullary rod and compression screw is noted in right femur. Electronically Signed   By: Lahoma Crocker M.D.   On:  09/13/2015 16:32   Dg Shoulder Left  Result Date: 09/13/2015 CLINICAL DATA:  Left shoulder pain and limited range of motion after a fall. EXAM: LEFT SHOULDER - 2+ VIEW COMPARISON:  None. FINDINGS: Degenerative changes in the acromioclavicular and glenohumeral joints. Diffuse bone demineralization. No evidence of acute fracture or dislocation in the left humerus. Calcification along greater tuberosity may indicate calcific tendinitis. IMPRESSION: Degenerative changes in the left shoulder. No acute fractures. Soft tissue calcifications suggesting calcific tendinitis. Electronically Signed   By: Lucienne Capers M.D.   On: 09/13/2015 03:33   Dg C-arm 61-120 Min  Result Date: 09/13/2015 CLINICAL DATA:  Portable imaging following left intertrochanteric proximal femur fracture ORIF EXAM: LEFT FEMUR 2 VIEWS; DG C-ARM 61-120 MIN COMPARISON:  09/13/2015 at 2:59 a.m. FINDINGS: Images show placement long intra medullary rod supporting a compression screw. The major fracture components have been reduced into near anatomic alignment. No new fracture or evidence of an operative complication. IMPRESSION: Well aligned major fracture components following ORIF of the left proximal femur intertrochanteric fracture. Electronically Signed   By: Lajean Manes M.D.   On: 09/13/2015 15:24   Dg Hip Unilat With Pelvis 2-3 Views Left  Result Date: 09/13/2015 CLINICAL DATA:  Fall with hematoma to the forehead. Left hip pain with shortening and rotation EXAM: DG HIP (WITH OR WITHOUT PELVIS) 2-3V LEFT COMPARISON:  None. FINDINGS:  Acute inter trochanteric fracture of the left hip with varus angulation of the fracture fragments. No dislocation at the hip joint. Pelvis appears intact. Degenerative changes in the lower lumbar spine and in both hips. Previous intra medullary rod fixation of the right hip. Vascular calcifications. IMPRESSION: Acute comminuted fracture of the inter trochanteric left hip with varus angulation. Electronically Signed   By: Lucienne Capers M.D.   On: 09/13/2015 03:31   Dg Femur Min 2 Views Left  Result Date: 09/13/2015 CLINICAL DATA:  82 year old female status post left femur surgery for intertrochanteric fracture. Initial encounter. EXAM: LEFT FEMUR 2 VIEWS COMPARISON:  Intraoperative images 1558 hours today. FINDINGS: Portable AP and cross-table lateral views of the left femur. Left femur intra medullary rod placed with proximal interlocking dynamic hip screw and distal interlocking cortical screws. Near anatomic alignment about the intertrochanteric fracture. Hardware appears intact. Left femoral head remains normally located. Overlying postoperative changes to the soft tissues with subcutaneous gas and skin staples. Iliac and femoral artery calcified atherosclerosis. IMPRESSION: 1. Left femur ORIF with no adverse features. 2. Calcified peripheral vascular disease. Electronically Signed   By: Genevie Ann M.D.   On: 09/13/2015 16:29   Dg Femur Min 2 Views Left  Result Date: 09/13/2015 CLINICAL DATA:  Portable imaging following left intertrochanteric proximal femur fracture ORIF EXAM: LEFT FEMUR 2 VIEWS; DG C-ARM 61-120 MIN COMPARISON:  09/13/2015 at 2:59 a.m. FINDINGS: Images show placement long intra medullary rod supporting a compression screw. The major fracture components have been reduced into near anatomic alignment. No new fracture or evidence of an operative complication. IMPRESSION: Well aligned major fracture components following ORIF of the left proximal femur intertrochanteric fracture.  Electronically Signed   By: Lajean Manes M.D.   On: 09/13/2015 15:24    Assessment/Plan 1. Pain, postoperative, acute  Schedule tylenol 650 mg po TID- continue  Oxycodone 5 mg 1 tablet po QID scheduled. OK to hold for sedation  Oxycodone 5 mg 1-2 tablets po Q 3 hours prn pain    Ice pack QID and prn   Family/ staff Communication:   Total Time:  20 minutes  Documentation: 10 minutes  Face to Face: 10 minutes  Family/Phone:   Labs/tests ordered:  none  Vikki Ports, NP-C [facility] [address], [place] [address] Cell Phone (Mon-Fri 8am-5pm):  [phone] On Call:  [phone] & follow prompts after 5pm & weekends Office Phone:  [phone] Office Fax:  [phone]

## 2015-09-29 ENCOUNTER — Encounter
Admission: RE | Admit: 2015-09-29 | Discharge: 2015-09-29 | Disposition: A | Discharge status: Home / Self Care | Source: Ambulatory Visit | Attending: Internal Medicine | Admitting: Internal Medicine

## 2015-09-29 DIAGNOSIS — E119 Type 2 diabetes mellitus without complications: Secondary | ICD-10-CM | POA: Insufficient documentation

## 2015-09-29 LAB — GLUCOSE, CAPILLARY
GLUCOSE-CAPILLARY: 390 mg/dL — AB (ref 65–99)
Glucose-Capillary: 232 mg/dL — ABNORMAL HIGH (ref 65–99)
Glucose-Capillary: 245 mg/dL — ABNORMAL HIGH (ref 65–99)

## 2015-09-30 DIAGNOSIS — E119 Type 2 diabetes mellitus without complications: Secondary | ICD-10-CM | POA: Diagnosis not present

## 2015-09-30 LAB — GLUCOSE, CAPILLARY
GLUCOSE-CAPILLARY: 214 mg/dL — AB (ref 65–99)
Glucose-Capillary: 174 mg/dL — ABNORMAL HIGH (ref 65–99)
Glucose-Capillary: 256 mg/dL — ABNORMAL HIGH (ref 65–99)
Glucose-Capillary: 181 mg/dL — ABNORMAL HIGH (ref 65–99)

## 2015-10-01 DIAGNOSIS — E119 Type 2 diabetes mellitus without complications: Secondary | ICD-10-CM | POA: Diagnosis not present

## 2015-10-01 LAB — GLUCOSE, CAPILLARY
GLUCOSE-CAPILLARY: 113 mg/dL — AB (ref 65–99)
GLUCOSE-CAPILLARY: 202 mg/dL — AB (ref 65–99)
GLUCOSE-CAPILLARY: 299 mg/dL — AB (ref 65–99)
GLUCOSE-CAPILLARY: 190 mg/dL — AB (ref 65–99)

## 2015-10-02 DIAGNOSIS — E119 Type 2 diabetes mellitus without complications: Secondary | ICD-10-CM | POA: Diagnosis not present

## 2015-10-02 LAB — GLUCOSE, CAPILLARY
GLUCOSE-CAPILLARY: 123 mg/dL — AB (ref 65–99)
GLUCOSE-CAPILLARY: 198 mg/dL — AB (ref 65–99)
Glucose-Capillary: 235 mg/dL — ABNORMAL HIGH (ref 65–99)
Glucose-Capillary: 245 mg/dL — ABNORMAL HIGH (ref 65–99)

## 2015-10-03 ENCOUNTER — Emergency Department: Payer: Medicare Other

## 2015-10-03 ENCOUNTER — Emergency Department
Admission: EM | Admit: 2015-10-03 | Discharge: 2015-10-03 | Disposition: A | Discharge status: Home / Self Care | Payer: Medicare Other | Attending: Emergency Medicine | Admitting: Emergency Medicine

## 2015-10-03 DIAGNOSIS — M25551 Pain in right hip: Secondary | ICD-10-CM | POA: Insufficient documentation

## 2015-10-03 DIAGNOSIS — Z7952 Long term (current) use of systemic steroids: Secondary | ICD-10-CM | POA: Insufficient documentation

## 2015-10-03 DIAGNOSIS — Y999 Unspecified external cause status: Secondary | ICD-10-CM | POA: Diagnosis not present

## 2015-10-03 DIAGNOSIS — Y92129 Unspecified place in nursing home as the place of occurrence of the external cause: Secondary | ICD-10-CM | POA: Insufficient documentation

## 2015-10-03 DIAGNOSIS — E119 Type 2 diabetes mellitus without complications: Secondary | ICD-10-CM | POA: Diagnosis not present

## 2015-10-03 DIAGNOSIS — F0390 Unspecified dementia without behavioral disturbance: Secondary | ICD-10-CM | POA: Insufficient documentation

## 2015-10-03 DIAGNOSIS — Z85850 Personal history of malignant neoplasm of thyroid: Secondary | ICD-10-CM | POA: Diagnosis not present

## 2015-10-03 DIAGNOSIS — Z794 Long term (current) use of insulin: Secondary | ICD-10-CM | POA: Diagnosis not present

## 2015-10-03 DIAGNOSIS — W19XXXA Unspecified fall, initial encounter: Secondary | ICD-10-CM

## 2015-10-03 DIAGNOSIS — S81812A Laceration without foreign body, left lower leg, initial encounter: Secondary | ICD-10-CM | POA: Insufficient documentation

## 2015-10-03 DIAGNOSIS — Z79891 Long term (current) use of opiate analgesic: Secondary | ICD-10-CM | POA: Insufficient documentation

## 2015-10-03 DIAGNOSIS — Z79899 Other long term (current) drug therapy: Secondary | ICD-10-CM | POA: Diagnosis not present

## 2015-10-03 DIAGNOSIS — Y939 Activity, unspecified: Secondary | ICD-10-CM | POA: Insufficient documentation

## 2015-10-03 DIAGNOSIS — W06XXXA Fall from bed, initial encounter: Secondary | ICD-10-CM | POA: Insufficient documentation

## 2015-10-03 DIAGNOSIS — S51012A Laceration without foreign body of left elbow, initial encounter: Secondary | ICD-10-CM | POA: Diagnosis not present

## 2015-10-03 DIAGNOSIS — I10 Essential (primary) hypertension: Secondary | ICD-10-CM | POA: Diagnosis not present

## 2015-10-03 DIAGNOSIS — Z7901 Long term (current) use of anticoagulants: Secondary | ICD-10-CM | POA: Diagnosis not present

## 2015-10-03 DIAGNOSIS — S59902A Unspecified injury of left elbow, initial encounter: Secondary | ICD-10-CM | POA: Diagnosis present

## 2015-10-03 DIAGNOSIS — Z791 Long term (current) use of non-steroidal anti-inflammatories (NSAID): Secondary | ICD-10-CM | POA: Diagnosis not present

## 2015-10-03 LAB — CBC
HEMATOCRIT: 34.8 % — AB (ref 35.0–47.0)
HEMOGLOBIN: 11.2 g/dL — AB (ref 12.0–16.0)
MCH: 27.2 pg (ref 26.0–34.0)
MCHC: 32.3 g/dL (ref 32.0–36.0)
MCV: 84.2 fL (ref 80.0–100.0)
Platelets: 627 10*3/uL — ABNORMAL HIGH (ref 150–440)
RBC: 4.13 MIL/uL (ref 3.80–5.20)
RDW: 15.5 % — AB (ref 11.5–14.5)
WBC: 11.5 10*3/uL — AB (ref 3.6–11.0)

## 2015-10-03 LAB — BASIC METABOLIC PANEL
Anion gap: 6 (ref 5–15)
BUN: 24 mg/dL — AB (ref 6–20)
CHLORIDE: 96 mmol/L — AB (ref 101–111)
CO2: 30 mmol/L (ref 22–32)
CREATININE: 0.56 mg/dL (ref 0.44–1.00)
Calcium: 9.3 mg/dL (ref 8.9–10.3)
GFR calc Af Amer: >60 mL/min (ref >60)
GFR calc non Af Amer: >60 mL/min (ref >60)
Glucose, Bld: 142 mg/dL — ABNORMAL HIGH (ref 65–99)
POTASSIUM: 4 mmol/L (ref 3.5–5.1)
SODIUM: 132 mmol/L — AB (ref 135–145)

## 2015-10-03 LAB — TROPONIN I

## 2015-10-03 MED ORDER — BACITRACIN ZINC 500 UNIT/GM EX OINT
TOPICAL_OINTMENT | CUTANEOUS | Status: AC
  Filled 2015-10-03: qty 0.9

## 2015-10-03 MED ORDER — METOPROLOL TARTRATE 50 MG PO TABS
50.0000 mg | ORAL_TABLET | Freq: Once | ORAL | Status: AC
  Administered 2015-10-03: 50 mg via ORAL
  Filled 2015-10-03: qty 1

## 2015-10-03 MED ORDER — OXYCODONE HCL 5 MG PO TABS
5.0000 mg | ORAL_TABLET | Freq: Once | ORAL | Status: AC
  Administered 2015-10-03: 5 mg via ORAL
  Filled 2015-10-03: qty 1

## 2015-10-03 NOTE — ED Provider Notes (Signed)
Patient is in no distress, vital signs are stable. She is stable to return to [facility]. Family agrees with plan.   Earleen Newport, MD 10/03/15 [phone]

## 2015-10-03 NOTE — ED Provider Notes (Signed)
[facility] Emergency Department Provider Note   ____________________________________________   First MD Initiated Contact with Patient 10/03/15 0600     (approximate)  I have reviewed the triage vital signs and the nursing notes.   HISTORY  Chief Complaint Fall and Hip Pain  History Limited by patient's dementia  HPI Amanda Stark is a 82 y.o. female who comes into the hospital today after a fall at her nursing home. The patient rolled out of bed and was found on a padded mat on the floor. The patient has a history of falls with previous hip fractures bilaterally. The patient had a left-sided hip fracture one month ago. Per EMS the patient came in complaining of some right-sided hip pain. Her right leg is shortened but they report that that is at the patient's baseline. The patient has a history of dementia and while she can answer some questions she is still confused as to what happened tonight. She reports that she is unsure what happened. According to EMS as well the patient has a special lowered bed that about 10-12 inches off of the floor and there are padded mattress around her bed due to the history of her falls. The patient is here for evaluation.   Past Medical History:  Diagnosis Date  . Diabetes mellitus without complication ([place])   . Glenohumeral arthritis   . Hyperlipidemia   . Hypertension   . Instability of right shoulder joint   . Lumbar spinal stenosis   . Osteoporosis   . PMR (polymyalgia rheumatica) ([facility])   . SVT (supraventricular tachycardia) ([place])   . Thyroid cancer ([place])   . Thyroid disease   . Vertigo     Patient Active Problem List   Diagnosis Date Noted  . Pain, postoperative, acute 09/19/2015  . Hip fracture ([place]) 09/13/2015  . Closed right hip fracture ([place]) 05/03/2015  . Atrial fibrillation, rapid ([place]) 11/20/2014  . SVT (supraventricular tachycardia) ([place]) 08/02/2014  . Hyponatremia 08/02/2014    Past Surgical  History:  Procedure Laterality Date  . ABDOMINAL HYSTERECTOMY    . APPENDECTOMY    . INTRAMEDULLARY (IM) NAIL INTERTROCHANTERIC Right 05/04/2015   Procedure: INTRAMEDULLARY (IM) NAIL INTERTROCHANTRIC;  Surgeon: Corky Mull, MD;  Location: [facility] ORS;  Service: Orthopedics;  Laterality: Right;  . INTRAMEDULLARY (IM) NAIL INTERTROCHANTERIC Left 09/13/2015   Procedure: INTRAMEDULLARY (IM) NAIL INTERTROCHANTRIC;  Surgeon: Thornton Park, MD;  Location: [facility] ORS;  Service: Orthopedics;  Laterality: Left;  . kidney stone removal    . right lens implant    . THYROIDECTOMY    . TOTAL THYROIDECTOMY      Prior to Admission medications   Medication Sig Start Date End Date Taking? Authorizing Provider  acetaminophen (TYLENOL) 325 MG tablet Take 650 mg by mouth 3 (three) times daily.    Historical Provider, MD  amLODipine (NORVASC) 5 MG tablet Take 5 mg by mouth daily.    Historical Provider, MD  bisacodyl (DULCOLAX) 10 MG suppository Place 1 suppository (10 mg total) rectally daily as needed for moderate constipation. 05/08/15   Dustin Flock, MD  Calcium Carbonate-Vitamin D (CALCIUM 600+D) 600-400 MG-UNIT tablet Take 1 tablet by mouth 2 (two) times daily.    Historical Provider, MD  demeclocycline (DECLOMYCIN) 300 MG tablet Take 450 mg by mouth 2 (two) times daily.    Historical Provider, MD  docusate sodium (COLACE) 100 MG capsule Take 1 capsule (100 mg total) by mouth 2 (two) times daily. Patient not taking: Reported on  09/13/2015 05/08/15   Dustin Flock, MD  enoxaparin (LOVENOX) 40 MG/0.4ML injection Inject 0.4 mLs (40 mg total) into the skin daily. For 21 days for dvt prophylaxis for hip fracutre 09/15/15   Fritzi Mandes, MD  ferrous sulfate 325 (65 FE) MG tablet Take 1 tablet (325 mg total) by mouth 3 (three) times daily after meals. 09/15/15   Fritzi Mandes, MD  glucagon (GLUCAGEN) 1 MG SOLR injection Inject 1 mg into the muscle once as needed for low blood sugar.    Historical Provider, MD  guaifenesin  (ROBITUSSIN) 100 MG/5ML syrup Take 200 mg by mouth every 4 (four) hours as needed for cough.     Historical Provider, MD  insulin detemir (LEVEMIR) 100 UNIT/ML injection Inject 0.03 mLs (3 Units total) into the skin 2 (two) times daily. Pt uses 10 units in the morning and 4 units at bedtime. Patient taking differently: Inject 5-18 Units into the skin 2 (two) times daily. Pt uses 18 units in the morning and 5 units at bedtime. 05/08/15   Dustin Flock, MD  levothyroxine (SYNTHROID, LEVOTHROID) 150 MCG tablet Take 150 mcg by mouth daily before breakfast.    Historical Provider, MD  lidocaine (LIDODERM) 5 % Place 1 patch onto the skin daily.     Historical Provider, MD  LORazepam (ATIVAN) 1 MG tablet Take 1 tablet (1 mg total) by mouth at bedtime. 05/08/15   Dustin Flock, MD  meclizine (ANTIVERT) 25 MG tablet Take 25 mg by mouth 3 (three) times daily as needed for nausea.    Historical Provider, MD  metoprolol (LOPRESSOR) 50 MG tablet Take 1 tablet (50 mg total) by mouth 2 (two) times daily. 11/22/14   Rusty Aus, MD  omeprazole (PRILOSEC) 20 MG capsule Take 20 mg by mouth daily.    Historical Provider, MD  polyethylene glycol (MIRALAX / GLYCOLAX) packet Take 17 g by mouth every other day.     Historical Provider, MD  predniSONE (DELTASONE) 5 MG tablet Take 7.5 mg by mouth daily with breakfast.     Historical Provider, MD  senna-docusate (SENOKOT-S) 8.6-50 MG tablet Take 1 tablet by mouth 2 (two) times daily.    Historical Provider, MD  sotalol (BETAPACE) 80 MG tablet Take 0.5 tablets (40 mg total) by mouth every 12 (twelve) hours. 11/22/14   Rusty Aus, MD  traMADol (ULTRAM) 50 MG tablet Take by mouth 3 (three) times daily.    Historical Provider, MD    Allergies Ascriptin a-d [aspirin buf(alhyd-mghyd-cacar)]; Aspirin; Codeine; Hydrocodone; and Statins  Family History  Problem Relation Age of Onset  . Cancer Mother   . Cancer Sister     Social History Social History  Substance Use  Topics  . Smoking status: Never Smoker  . Smokeless tobacco: Never Used  . Alcohol use No    Review of Systems  Due to the patient's dementia she is unable to actively participate in the review of systems.   ____________________________________________   PHYSICAL EXAM:  VITAL SIGNS: ED Triage Vitals  Enc Vitals Group     BP 10/03/15 0548 (!) 118/102     Pulse Rate 10/03/15 0548 92     Resp 10/03/15 0548 16     Temp 10/03/15 0548 98.3 F (36.8 C)     Temp Source 10/03/15 0548 Oral     SpO2 10/03/15 0548 97 %     Weight 10/03/15 0551 105 lb (47.6 kg)     Height 10/03/15 0551 5\' 3"  (1.6 m)  Head Circumference --      Peak Flow --      Pain Score 10/03/15 0551 9     Pain Loc --      Pain Edu? --      Excl. in [place]? --     Constitutional: Alert and oriented. Well appearing and inMild to moderate distress. Eyes: Conjunctivae are normal. PERRL. EOMI. Head: Atraumatic. Nose: No congestion/rhinnorhea. Mouth/Throat: Mucous membranes are moist.  Oropharynx non-erythematous. Cardiovascular: Normal rate, regular rhythm. Grossly normal heart sounds.  Good peripheral circulation. Respiratory: Normal respiratory effort.  No retractions. Lungs CTAB. Gastrointestinal: Soft and nontender. No distention. Positive bowel sounds Musculoskeletal: Left-sided hip pain to palpation Neurologic:  Normal speech and language.  Skin:  Skin tear to left leg Psychiatric: Mood and affect are normal.   ____________________________________________   LABS (all labs ordered are listed, but only abnormal results are displayed)  Labs Reviewed  BASIC METABOLIC PANEL - Abnormal; Notable for the following:       Result Value   Sodium 132 (*)    Chloride 96 (*)    Glucose, Bld 142 (*)    BUN 24 (*)    All other components within normal limits  CBC - Abnormal; Notable for the following:    WBC 11.5 (*)    Hemoglobin 11.2 (*)    HCT 34.8 (*)    RDW 15.5 (*)    Platelets 627 (*)    All other  components within normal limits  TROPONIN I  TROPONIN I  TROPONIN I  URINALYSIS COMPLETEWITH MICROSCOPIC ([facility] ONLY)   ____________________________________________  EKG  ED ECG REPORT I, Loney Hering, the attending physician, personally viewed and interpreted this ECG.   Date: 10/03/2015  EKG Time: 554  Rate: 76  Rhythm: normal sinus rhythm  Axis: Normal  Intervals:none  ST&T Change: None  ____________________________________________  RADIOLOGY  CT head and cervical spine Right hip x-ray Left leg x-ray  ____________________________________________   PROCEDURES  Procedure(s) performed: None  Procedures  Critical Care performed: No  ____________________________________________   INITIAL IMPRESSION / ASSESSMENT AND PLAN / ED COURSE  Pertinent labs & imaging results that were available during my care of the patient were reviewed by me and considered in my medical decision making (see chart for details).  This is a 82 year old female who comes into the hospital today with a fall out of her bed at her nursing home. The patient initially complained of right hip pain but then complained of left hip pain. I will assess the patient's imaging as well as the patient's blood work and then [employer] reassess the patient once I have the results.  Clinical Course  Value Comment By Time  DG Hip Unilat  With Pelvis 2-3 Views Right 1. No evidence of fracture or dislocation. 2. Bilateral femoral hardware is grossly unremarkable. 3. Diffuse vascular calcifications seen.   Loney Hering, MD 09/05 0710  DG Tibia/Fibula Left No evidence of fracture or dislocation Loney Hering, MD 09/05 [phone]  CT Head Wo Contrast 1. No acute intracranial abnormality or significant interval change. 2. Advanced atrophy and diffuse white matter disease likely reflects the sequela of chronic microvascular ischemia. 3. Atherosclerosis. 4. Stable spondylosis of the cervical spine without  evidence for acute fracture or traumatic subluxation.   Loney Hering, MD 09/05 [phone]   The patient developed atrial fibrillation while she was in the ER with rapid ventricular rate. I will give the patient her morning dose of metoprolol 50  mg as well as a dose of oxycodone as she says she is still hurting 9 out of 10 in intensity. We will reassess the patient when she's receive her medication. The patient's care will be signed out to Dr. Jimmye Norman who will reassess the patient.  ____________________________________________   FINAL CLINICAL IMPRESSION(S) / ED DIAGNOSES  Final diagnoses:  Fall, initial encounter  Right hip pain  Skin tear of elbow without complication, left, initial encounter      NEW MEDICATIONS STARTED DURING THIS VISIT:  New Prescriptions   No medications on file     Note:  This document was prepared using Dragon voice recognition software and may include unintentional dictation errors.    Loney Hering, MD 10/03/15 [phone]

## 2015-10-03 NOTE — ED Triage Notes (Signed)
Pt arrives to ED via [facility] from [facility] d/t a fall/roll out of bed. EMS states pt fell about 10-12" from a low bed onto safety mats that were placed nearby d/t pt having dementia and being a fall risk. Pt with c/o RIGHT hip pain, LEFT lower leg pain. EMS states pt recently had srugery on the LEFT hip d/t a fracture with sutures still in place.

## 2015-10-04 ENCOUNTER — Non-Acute Institutional Stay (SKILLED_NURSING_FACILITY): Payer: Medicare Other | Admitting: Gerontology

## 2015-10-04 DIAGNOSIS — I4891 Unspecified atrial fibrillation: Secondary | ICD-10-CM | POA: Diagnosis not present

## 2015-10-04 DIAGNOSIS — S90822A Blister (nonthermal), left foot, initial encounter: Secondary | ICD-10-CM | POA: Diagnosis not present

## 2015-10-04 DIAGNOSIS — Z9181 History of falling: Secondary | ICD-10-CM

## 2015-10-04 DIAGNOSIS — G8918 Other acute postprocedural pain: Secondary | ICD-10-CM

## 2015-10-04 NOTE — Progress Notes (Signed)
Location:      Place of Service:  SNF (31) Provider:  Toni Arthurs, NP-C  Kirk Ruths., MD  Patient Care Team: Kirk Ruths, MD as PCP - General (Internal Medicine)  Extended Emergency Contact Information Primary Emergency Contact: Karen Chafe Address: [address]          [place], [place] [address] Johnnette Litter of [place] Phone: [phone] Relation: Son Secondary Emergency Contact: Lynch,Kathy Address: [address]          [place], [place] [address] Johnnette Litter of [place] Phone: [phone] Mobile Phone: [phone] Relation: Daughter  Code Status:  dnr Goals of care: Advanced Directive information Advanced Directives 10/03/2015  Does patient have an advance directive? Yes  Type of Advance Directive Out of facility DNR (pink MOST or yellow form)  Does patient want to make changes to advanced directive? No - Patient declined  Copy of advanced directive(s) in chart? No - copy requested  Would patient like information on creating an advanced directive? -  Pre-existing out of facility DNR order (yellow form or pink MOST form) Yellow form placed in chart (order not valid for inpatient use)     Chief Complaint  Patient presents with  . Medical Management of Chronic Issues    HPI:  Pt is a 82 y.o. female seen today for medical management of chronic diseases. Orson Gear seen today for management and follow up of her acute postoperative pain related to fall from the bed two weeks ago causing a hip fracture, rapid atrial fibrillation, personal history of Falls, and blister of the left heel initial encounter. Patient has been doing well with pain control as of late. Fentanyl 12 microgram patch was initiated last week, doing well with this. Also has scheduled Tylenol and receives oxycodone if needed. Patient appears comfortable and at ease when evaluated. Patient had another fall yesterday morning, unwitnessed. She apparently slid out of bed. Caused  skin tear to the left shin. Was sent to the hospital to be evaluated per protocol. No acute findings aside from skin tear. Patient return to facility to continue her stay. Heart rate is well controlled with metoprolol 50 mg twice a day. continue to closely monitor for pain control issues as this will cause heart rate to increase. Will consider an increase in metoprolol dose or possibly Propanolol if heart rate climbs. Since return from the hospital patient has developed a blister on the left heel. Skin is intact. With unblanchablel redness at base of blister. Area at this time is still a small bullous, with evidence of healing. Patient does not complain of pain in this area. Please note, patient has dementia so review of systems was limited. However, she was alert and pleasant. Did not appear to be in pain or any discomfort. Vital signs stable. No other complaints.     Past Medical History:  Diagnosis Date  . Diabetes mellitus without complication ([facility])   . Glenohumeral arthritis   . Hyperlipidemia   . Hypertension   . Instability of right shoulder joint   . Lumbar spinal stenosis   . Osteoporosis   . PMR (polymyalgia rheumatica) ([facility])   . SVT (supraventricular tachycardia) ([facility])   . Thyroid cancer ([facility])   . Thyroid disease   . Vertigo    Past Surgical History:  Procedure Laterality Date  . ABDOMINAL HYSTERECTOMY    . APPENDECTOMY    . INTRAMEDULLARY (IM) NAIL INTERTROCHANTERIC Right 05/04/2015   Procedure: INTRAMEDULLARY (IM) NAIL INTERTROCHANTRIC;  Surgeon: Marshall Cork  Poggi, MD;  Location: [facility] ORS;  Service: Orthopedics;  Laterality: Right;  . INTRAMEDULLARY (IM) NAIL INTERTROCHANTERIC Left 09/13/2015   Procedure: INTRAMEDULLARY (IM) NAIL INTERTROCHANTRIC;  Surgeon: Thornton Park, MD;  Location: [facility] ORS;  Service: Orthopedics;  Laterality: Left;  . kidney stone removal    . right lens implant    . THYROIDECTOMY    . TOTAL THYROIDECTOMY      Allergies  Allergen Reactions  . Ascriptin  A-D [Aspirin Buf(Alhyd-Mghyd-Cacar)] Other (See Comments)    Reaction:  Unknown   . Aspirin Other (See Comments)    Reaction:  Unknown   . Codeine Other (See Comments)    Reaction:  Unknown   . Hydrocodone Other (See Comments)    Reaction:  Unknown   . Statins Other (See Comments)    Reaction:  Muscle pain    . Tramadol Other (See Comments)    Reaction: unknown      Medication List       Accurate as of 10/04/15 12:26 PM. Always use your most recent med list.          acetaminophen 325 MG tablet Commonly known as:  TYLENOL Take 650 mg by mouth 3 (three) times daily.   amLODipine 5 MG tablet Commonly known as:  NORVASC Take 5 mg by mouth daily.   bisacodyl 10 MG suppository Commonly known as:  DULCOLAX Place 1 suppository (10 mg total) rectally daily as needed for moderate constipation.   CALCIUM 600+D 600-400 MG-UNIT tablet Generic drug:  Calcium Carbonate-Vitamin D Take 1 tablet by mouth 2 (two) times daily.   cholecalciferol 1000 units tablet Commonly known as:  VITAMIN D Take 4,000 Units by mouth daily.   demeclocycline 300 MG tablet Commonly known as:  DECLOMYCIN Take 450 mg by mouth 2 (two) times daily.   dextrose 40 % Gel Commonly known as:  GLUTOSE Take 1 Tube by mouth as needed for low blood sugar. Every 30 minutes orally as needed until BS>70 and patient is still responsive   docusate sodium 100 MG capsule Commonly known as:  COLACE Take 1 capsule (100 mg total) by mouth 2 (two) times daily.   enoxaparin 40 MG/0.4ML injection Commonly known as:  LOVENOX Inject 0.4 mLs (40 mg total) into the skin daily. For 21 days for dvt prophylaxis for hip fracutre   fentaNYL 12 MCG/HR Commonly known as:  [place] - dosed mcg/hr Place 12.5 mcg onto the skin every 3 (three) days.   ferrous sulfate 325 (65 FE) MG tablet Take 1 tablet (325 mg total) by mouth 3 (three) times daily after meals.   glucagon 1 MG Solr injection Commonly known as:  GLUCAGEN Inject  1 mg into the muscle once as needed for low blood sugar.   guaifenesin 100 MG/5ML syrup Commonly known as:  ROBITUSSIN Take 200 mg by mouth every 4 (four) hours as needed for cough.   insulin detemir 100 UNIT/ML injection Commonly known as:  LEVEMIR Inject 0.03 mLs (3 Units total) into the skin 2 (two) times daily. Pt uses 10 units in the morning and 4 units at bedtime.   levothyroxine 150 MCG tablet Commonly known as:  SYNTHROID, LEVOTHROID Take 150 mcg by mouth daily before breakfast.   lidocaine 5 % Commonly known as:  LIDODERM Place 1 patch onto the skin every 12 (twelve) hours. Apply to affected area for up to 12 hours then remove   LORazepam 1 MG tablet Commonly known as:  ATIVAN Take 1 tablet (1 mg total)  by mouth at bedtime.   meclizine 25 MG tablet Commonly known as:  ANTIVERT Take 25 mg by mouth 3 (three) times daily as needed for nausea.   metoprolol 50 MG tablet Commonly known as:  LOPRESSOR Take 1 tablet (50 mg total) by mouth 2 (two) times daily.   nystatin cream Commonly known as:  MYCOSTATIN Apply 1 application topically 3 (three) times daily as needed for dry skin. Apply thin film to inner labia as needed for itching   omeprazole 20 MG capsule Commonly known as:  PRILOSEC Take 20 mg by mouth daily.   oxyCODONE 5 MG immediate release tablet Commonly known as:  Oxy IR/ROXICODONE Take 5-10 mg by mouth every 3 (three) hours as needed for severe pain. Give 5mg  for moderate pain. Give 10mg  for severe pain.   polyethylene glycol packet Commonly known as:  MIRALAX / GLYCOLAX Take 17 g by mouth every other day.   predniSONE 5 MG tablet Commonly known as:  DELTASONE Take 7.5 mg by mouth daily with breakfast.   senna-docusate 8.6-50 MG tablet Commonly known as:  Senokot-S Take 1 tablet by mouth 2 (two) times daily.   sorbitol 70 % solution Take 30 mLs by mouth 2 (two) times daily.   sotalol 80 MG tablet Commonly known as:  BETAPACE Take 0.5 tablets (40  mg total) by mouth every 12 (twelve) hours.   TOUJEO SOLOSTAR 300 UNIT/ML Sopn Generic drug:  Insulin Glargine Inject 12 Units into the skin daily.   traMADol 50 MG tablet Commonly known as:  ULTRAM Take 50 mg by mouth every 4 (four) hours as needed.       Review of Systems  Unable to perform ROS: Dementia  Constitutional: Negative for activity change, appetite change, chills, diaphoresis and fever.  HENT: Negative for congestion, sneezing, sore throat, trouble swallowing and voice change.   Respiratory: Negative for apnea, cough, choking, chest tightness, shortness of breath and wheezing.   Cardiovascular: Positive for leg swelling. Negative for chest pain and palpitations.  Gastrointestinal: Negative for abdominal distention, abdominal pain, constipation, diarrhea and nausea.  Genitourinary: Negative for difficulty urinating, dysuria, frequency and urgency.  Musculoskeletal: Positive for arthralgias (typical arthritis), gait problem and joint swelling. Negative for back pain and myalgias.  Skin: Positive for pallor and wound. Negative for color change and rash.  Neurological: Negative for dizziness, tremors, syncope, speech difficulty, weakness, numbness and headaches.  Psychiatric/Behavioral: Negative for agitation and behavioral problems.  All other systems reviewed and are negative.    There is no immunization history on file for this patient. Pertinent  Health Maintenance Due  Topic Date Due  . DEXA SCAN  02/02/1989  . PNA vac Low Risk Adult (1 of 2 - PCV13) 02/02/1989  . INFLUENZA VACCINE  08/29/2015   No flowsheet data found. Functional Status Survey:    Vitals:   10/04/15 0500  BP: (!) 104/55  Pulse: 88  Resp: 20  Temp: 97.5 F (36.4 C)  SpO2: 96%   There is no height or weight on file to calculate BMI. Physical Exam  Constitutional: She is oriented to person, place, and time. Vital signs are normal. She appears well-developed and well-nourished. She is  active and cooperative. She does not appear ill. No distress.  HENT:  Head: Normocephalic and atraumatic.  Mouth/Throat: Uvula is midline, oropharynx is clear and moist and mucous membranes are normal. Mucous membranes are not pale, not dry and not cyanotic.  Eyes: Conjunctivae, EOM and lids are normal. Pupils are equal,  round, and reactive to light.  Neck: Trachea normal, normal range of motion and full passive range of motion without pain. Neck supple. No JVD present. No tracheal deviation, no edema and no erythema present. No thyromegaly present.  Cardiovascular: Normal rate, regular rhythm, normal heart sounds, intact distal pulses and normal pulses.  Exam reveals no gallop, no distant heart sounds and no friction rub.   No murmur heard. Pulmonary/Chest: Effort normal and breath sounds normal. No accessory muscle usage. No respiratory distress. She has no wheezes. She has no rales. She exhibits no tenderness.  Abdominal: Normal appearance and bowel sounds are normal. She exhibits no distension and no ascites. There is no tenderness. There is no guarding.  Musculoskeletal: She exhibits no edema.       Left hip: She exhibits decreased range of motion, decreased strength, tenderness, swelling and laceration (incision).  Expected osteoarthritis, stiffness  Neurological: She is alert and oriented to person, place, and time. She has normal strength. Disoriented: some confusion.  Skin: Skin is warm and dry. Bruising and laceration (Left hip incision) noted. She is not diaphoretic. No cyanosis. No pallor. Nails show no clubbing.  Yellow bruising to left side of head/ forehead/ peri-orbital area- r/t fall 2 weeks ago.  Blister on left heel- slight bullous, appears to have gone down in size, pink/purple ring at base of bullous 3cm diameter. No dressing at present time. Skin intact  Skin tear on Left lower leg- covered with Kerlix. R/t most recent fall.   Scab with bruising on right shin- r/t previous  fall  Psychiatric: She has a normal mood and affect. Her speech is normal and behavior is normal. Judgment and thought content normal. Cognition and memory are normal.  Nursing note and vitals reviewed.   Labs reviewed:  Recent Labs  09/14/15 0459 09/15/15 0435 10/03/15 0559  NA 133* 133* 132*  K 4.0 3.8 4.0  CL 95* 97* 96*  CO2 31 33* 30  GLUCOSE 145* 80 142*  BUN 29* 23* 24*  CREATININE 0.70 0.48 0.56  CALCIUM 8.7* 8.8* 9.3    Recent Labs  11/20/14 2009 01/29/15 1130 05/13/15 1730 09/13/15 0922  AST 23 23 21   --   ALT 18 20 14   --   ALKPHOS 86 62 69  --   BILITOT 0.7 0.6 0.9  --   PROT 7.0 7.6 6.8  --   ALBUMIN 3.3* 3.4* 3.1* 3.1*    Recent Labs  05/22/15 0750 06/13/15 0843 09/13/15 0250 09/14/15 0459 09/15/15 0435 10/03/15 0559  WBC 12.5* 10.4 12.1* 20.1* 17.3* 11.5*  NEUTROABS 8.8* 6.4 7.5*  --   --   --   HGB 10.5* 11.3* 11.1* 7.7* 8.4* 11.2*  HCT 32.0* 33.6* 34.5* 23.6* 25.2* 34.8*  MCV 86.6 84.6 83.5 83.8 82.2 84.2  PLT 522* 364 379 269 227 627*   Lab Results  Component Value Date   TSH 1.562 09/13/2015   Lab Results  Component Value Date   HGBA1C 8.0 (H) 09/13/2015   No results found for: CHOL, HDL, LDLCALC, LDLDIRECT, TRIG, CHOLHDL  Significant Diagnostic Results in last 30 days:  Dg Chest 1 View  Result Date: 09/13/2015 CLINICAL DATA:  Preoperative chest x-ray.  Left hip fracture. EXAM: CHEST 1 VIEW COMPARISON:  05/07/2015 FINDINGS: Normal heart size and pulmonary vascularity. Emphysematous changes in the lungs. Linear fibrosis in the right lung base. Esophageal hiatal hernia behind the heart. Calcified and tortuous aorta. No focal consolidation. No blunting of costophrenic angles. No pneumothorax. Old  right rib fractures. Degenerative changes and scoliosis of the thoracolumbar spine. Degenerative changes in the shoulders. IMPRESSION: No evidence of active pulmonary disease. Emphysematous changes and fibrosis in the lungs. Esophageal hiatal  hernia behind the heart. Electronically Signed   By: Lucienne Capers M.D.   On: 09/13/2015 03:30   Dg Tibia/fibula Left  Result Date: 10/03/2015 CLINICAL DATA:  Status post fall, with left lower leg pain. Initial encounter. EXAM: LEFT TIBIA AND FIBULA - 2 VIEW COMPARISON:  Left tibia/fibula radiographs performed 02/25/2015 FINDINGS: The tibia and fibula are grossly intact. There is no evidence of fracture or dislocation. The knee joint is grossly unremarkable in appearance. A femoral intramedullary rod is only minimally imaged on this study. Overlying skin staples are seen. Scattered soft tissue calcifications are noted at the left lower leg. IMPRESSION: No evidence of fracture or dislocation. Electronically Signed   By: Garald Balding M.D.   On: 10/03/2015 07:08   Ct Head Wo Contrast  Result Date: 10/03/2015 CLINICAL DATA:  Fall out of bed. Fell 10 inches onto a mat. Personal history of dementia. EXAM: CT HEAD WITHOUT CONTRAST CT CERVICAL SPINE WITHOUT CONTRAST TECHNIQUE: Multidetector CT imaging of the head and cervical spine was performed following the standard protocol without intravenous contrast. Multiplanar CT image reconstructions of the cervical spine were also generated. COMPARISON:  CT head without contrast 09/13/2015 FINDINGS: CT HEAD FINDINGS Advanced atrophy and diffuse white matter disease is similar to the prior exam. No acute infarct, hemorrhage, or mass lesion is present. Remote infarcts are present in the left cerebellum. No acute cortical infarct is present. The ventricles are proportionate to the degree of atrophy. No significant extra-axial fluid collection is present. Atherosclerotic calcifications are present within the cavernous internal carotid arteries and at the dural margin of both vertebral arteries. No focal hyperdense vessel is present. The paranasal sinuses and mastoid air cells are clear. Calvarium is intact. The globes orbits are within normal limits. No significant  extracranial soft tissue injury is evident. CT CERVICAL SPINE FINDINGS The cervical spine is imaged from the skullbase through T2-3. Chronic loss of disc height at C5-6 is stable. Endplate changes are stable. Uncovertebral spurring is present on the left. No acute fracture or traumatic subluxation is present. Mild leftward curvature of the cervical spine is stable. Atherosclerotic calcifications are present at carotid bifurcations and proximal great vessels. The visualized lung apices are clear. The soft tissues of the neck are otherwise unremarkable. IMPRESSION: 1. No acute intracranial abnormality or significant interval change. 2. Advanced atrophy and diffuse white matter disease likely reflects the sequela of chronic microvascular ischemia. 3. Atherosclerosis. 4. Stable spondylosis of the cervical spine without evidence for acute fracture or traumatic subluxation. Electronically Signed   By: San Morelle M.D.   On: 10/03/2015 07:20   Ct Head Wo Contrast  Result Date: 09/13/2015 CLINICAL DATA:  Status post fall, with forehead hematoma. Concern for cervical spine injury. Initial encounter. EXAM: CT HEAD WITHOUT CONTRAST CT CERVICAL SPINE WITHOUT CONTRAST TECHNIQUE: Multidetector CT imaging of the head and cervical spine was performed following the standard protocol without intravenous contrast. Multiplanar CT image reconstructions of the cervical spine were also generated. COMPARISON:  CT of the head performed 09/14/2014, and CT of the cervical spine performed 08/02/2014 FINDINGS: CT HEAD FINDINGS There is no evidence of acute infarction, mass lesion, or intra- or extra-axial hemorrhage on CT. Prominence of the ventricles and sulci reflects mild to moderate cortical volume loss. Scattered periventricular and subcortical white matter change likely  reflects small vessel ischemic microangiopathy. A small chronic infarct is seen at the left cerebellar hemisphere. The brainstem and fourth ventricle are  within normal limits. The basal ganglia are unremarkable in appearance. The cerebral hemispheres demonstrate grossly normal gray-white differentiation. No mass effect or midline shift is seen. There is no evidence of fracture; there is minimal nonspecific heterogeneity of visualized osseous structures. The visualized portions of the orbits are within normal limits. The paranasal sinuses and mastoid air cells are well-aerated. Soft tissue swelling is noted overlying the frontal calvarium. CT CERVICAL SPINE FINDINGS There is no evidence of fracture or subluxation. Vertebral bodies demonstrate normal height and alignment. Intervertebral disc space narrowing is noted at C5-C6, with a small anterior disc osteophyte complex. Prevertebral soft tissues are within normal limits. The visualized neural foramina are grossly unremarkable. The thyroid gland is unremarkable in appearance. The visualized lung apices are clear. Calcification is noted at the carotid bifurcations bilaterally, more prominent on the left. There is likely mild to moderate luminal narrowing at the proximal left internal carotid artery. IMPRESSION: 1. No evidence of traumatic intracranial injury or fracture. 2. No evidence of fracture or subluxation along the cervical spine. 3. Soft tissue swelling overlying the frontal calvarium. 4. Mild to moderate cortical volume loss and scattered small vessel ischemic microangiopathy. 5. 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 6. Calcification at the carotid bifurcations bilaterally, more prominent on the left. There is likely mild to moderate luminal narrowing at the proximal left internal carotid artery. Carotid ultrasound would be helpful for further evaluation, when and as deemed clinically appropriate. Electronically Signed   By: Garald Balding M.D.   On: 09/13/2015 04:01   Ct Cervical Spine Wo Contrast  Result Date: 10/03/2015 CLINICAL DATA:  Fall out of bed. Fell 10 inches onto a mat.  Personal history of dementia. EXAM: CT HEAD WITHOUT CONTRAST CT CERVICAL SPINE WITHOUT CONTRAST TECHNIQUE: Multidetector CT imaging of the head and cervical spine was performed following the standard protocol without intravenous contrast. Multiplanar CT image reconstructions of the cervical spine were also generated. COMPARISON:  CT head without contrast 09/13/2015 FINDINGS: CT HEAD FINDINGS Advanced atrophy and diffuse white matter disease is similar to the prior exam. No acute infarct, hemorrhage, or mass lesion is present. Remote infarcts are present in the left cerebellum. No acute cortical infarct is present. The ventricles are proportionate to the degree of atrophy. No significant extra-axial fluid collection is present. Atherosclerotic calcifications are present within the cavernous internal carotid arteries and at the dural margin of both vertebral arteries. No focal hyperdense vessel is present. The paranasal sinuses and mastoid air cells are clear. Calvarium is intact. The globes orbits are within normal limits. No significant extracranial soft tissue injury is evident. CT CERVICAL SPINE FINDINGS The cervical spine is imaged from the skullbase through T2-3. Chronic loss of disc height at C5-6 is stable. Endplate changes are stable. Uncovertebral spurring is present on the left. No acute fracture or traumatic subluxation is present. Mild leftward curvature of the cervical spine is stable. Atherosclerotic calcifications are present at carotid bifurcations and proximal great vessels. The visualized lung apices are clear. The soft tissues of the neck are otherwise unremarkable. IMPRESSION: 1. No acute intracranial abnormality or significant interval change. 2. Advanced atrophy and diffuse white matter disease likely reflects the sequela of chronic microvascular ischemia. 3. Atherosclerosis. 4. Stable spondylosis of the cervical spine without evidence for acute fracture or traumatic subluxation. Electronically  Signed   By: San Morelle  M.D.   On: 10/03/2015 07:20   Ct Cervical Spine Wo Contrast  Result Date: 09/13/2015 CLINICAL DATA:  Status post fall, with forehead hematoma. Concern for cervical spine injury. Initial encounter. EXAM: CT HEAD WITHOUT CONTRAST CT CERVICAL SPINE WITHOUT CONTRAST TECHNIQUE: Multidetector CT imaging of the head and cervical spine was performed following the standard protocol without intravenous contrast. Multiplanar CT image reconstructions of the cervical spine were also generated. COMPARISON:  CT of the head performed 09/14/2014, and CT of the cervical spine performed 08/02/2014 FINDINGS: CT HEAD FINDINGS There is no evidence of acute infarction, mass lesion, or intra- or extra-axial hemorrhage on CT. Prominence of the ventricles and sulci reflects mild to moderate cortical volume loss. Scattered periventricular and subcortical white matter change likely reflects small vessel ischemic microangiopathy. A small chronic infarct is seen at the left cerebellar hemisphere. The brainstem and fourth ventricle are within normal limits. The basal ganglia are unremarkable in appearance. The cerebral hemispheres demonstrate grossly normal gray-white differentiation. No mass effect or midline shift is seen. There is no evidence of fracture; there is minimal nonspecific heterogeneity of visualized osseous structures. The visualized portions of the orbits are within normal limits. The paranasal sinuses and mastoid air cells are well-aerated. Soft tissue swelling is noted overlying the frontal calvarium. CT CERVICAL SPINE FINDINGS There is no evidence of fracture or subluxation. Vertebral bodies demonstrate normal height and alignment. Intervertebral disc space narrowing is noted at C5-C6, with a small anterior disc osteophyte complex. Prevertebral soft tissues are within normal limits. The visualized neural foramina are grossly unremarkable. The thyroid gland is unremarkable in appearance. The  visualized lung apices are clear. Calcification is noted at the carotid bifurcations bilaterally, more prominent on the left. There is likely mild to moderate luminal narrowing at the proximal left internal carotid artery. IMPRESSION: 1. No evidence of traumatic intracranial injury or fracture. 2. No evidence of fracture or subluxation along the cervical spine. 3. Soft tissue swelling overlying the frontal calvarium. 4. Mild to moderate cortical volume loss and scattered small vessel ischemic microangiopathy. 5. 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 6. Calcification at the carotid bifurcations bilaterally, more prominent on the left. There is likely mild to moderate luminal narrowing at the proximal left internal carotid artery. Carotid ultrasound would be helpful for further evaluation, when and as deemed clinically appropriate. Electronically Signed   By: Garald Balding M.D.   On: 09/13/2015 04:01   Dg Pelvis Portable  Result Date: 09/13/2015 CLINICAL DATA:  Post open reduction internal fixation left intertrochanteric fracture EXAM: PORTABLE PELVIS 1-2 VIEWS COMPARISON:  Intraoperative film same day FINDINGS: Single portable frontal view of the pelvis submitted. Again noted intra medullary rod and compression screw in proximal left femur. There is anatomic alignment. Postsurgical changes are noted with lateral skin staples and periarticular soft tissue air. Intra medullary rod and compression screw is noted in right femur. IMPRESSION: Again noted intra medullary rod and compression screw in proximal left femur. There is anatomic alignment. Postsurgical changes are noted with lateral skin staples and periarticular soft tissue air. Intra medullary rod and compression screw is noted in right femur. Electronically Signed   By: Lahoma Crocker M.D.   On: 09/13/2015 16:32   Dg Shoulder Left  Result Date: 09/13/2015 CLINICAL DATA:  Left shoulder pain and limited range of motion after a fall. EXAM:  LEFT SHOULDER - 2+ VIEW COMPARISON:  None. FINDINGS: Degenerative changes in the acromioclavicular and glenohumeral joints. Diffuse bone demineralization. No evidence of  acute fracture or dislocation in the left humerus. Calcification along greater tuberosity may indicate calcific tendinitis. IMPRESSION: Degenerative changes in the left shoulder. No acute fractures. Soft tissue calcifications suggesting calcific tendinitis. Electronically Signed   By: Lucienne Capers M.D.   On: 09/13/2015 03:33   Dg C-arm 61-120 Min  Result Date: 09/13/2015 CLINICAL DATA:  Portable imaging following left intertrochanteric proximal femur fracture ORIF EXAM: LEFT FEMUR 2 VIEWS; DG C-ARM 61-120 MIN COMPARISON:  09/13/2015 at 2:59 a.m. FINDINGS: Images show placement long intra medullary rod supporting a compression screw. The major fracture components have been reduced into near anatomic alignment. No new fracture or evidence of an operative complication. IMPRESSION: Well aligned major fracture components following ORIF of the left proximal femur intertrochanteric fracture. Electronically Signed   By: Lajean Manes M.D.   On: 09/13/2015 15:24   Dg Hip Unilat With Pelvis 2-3 Views Left  Result Date: 09/13/2015 CLINICAL DATA:  Fall with hematoma to the forehead. Left hip pain with shortening and rotation EXAM: DG HIP (WITH OR WITHOUT PELVIS) 2-3V LEFT COMPARISON:  None. FINDINGS: Acute inter trochanteric fracture of the left hip with varus angulation of the fracture fragments. No dislocation at the hip joint. Pelvis appears intact. Degenerative changes in the lower lumbar spine and in both hips. Previous intra medullary rod fixation of the right hip. Vascular calcifications. IMPRESSION: Acute comminuted fracture of the inter trochanteric left hip with varus angulation. Electronically Signed   By: Lucienne Capers M.D.   On: 09/13/2015 03:31   Dg Hip Unilat  With Pelvis 2-3 Views Right  Result Date: 10/03/2015 CLINICAL DATA:   Status post fall from bed, with right hip pain. Initial encounter. EXAM: DG HIP (WITH OR WITHOUT PELVIS) 2-3V RIGHT COMPARISON:  Right hip radiographs performed 05/03/2015 FINDINGS: There is no evidence of fracture or dislocation. Bilateral hip screws and intramedullary rods are seen. There is a chronically displaced left lesser trochanteric fragment, and a partially healed chronic right greater trochanteric fragment. Both femoral heads are seated normally within their respective acetabula. Mild degenerative change is noted at the lower lumbar spine. The visualized bowel gas pattern is grossly unremarkable in appearance. Diffuse vascular calcifications are seen. IMPRESSION: 1. No evidence of fracture or dislocation. 2. Bilateral femoral hardware is grossly unremarkable. 3. Diffuse vascular calcifications seen. Electronically Signed   By: Garald Balding M.D.   On: 10/03/2015 06:54   Dg Femur Min 2 Views Left  Result Date: 09/13/2015 CLINICAL DATA:  82 year old female status post left femur surgery for intertrochanteric fracture. Initial encounter. EXAM: LEFT FEMUR 2 VIEWS COMPARISON:  Intraoperative images 1558 hours today. FINDINGS: Portable AP and cross-table lateral views of the left femur. Left femur intra medullary rod placed with proximal interlocking dynamic hip screw and distal interlocking cortical screws. Near anatomic alignment about the intertrochanteric fracture. Hardware appears intact. Left femoral head remains normally located. Overlying postoperative changes to the soft tissues with subcutaneous gas and skin staples. Iliac and femoral artery calcified atherosclerosis. IMPRESSION: 1. Left femur ORIF with no adverse features. 2. Calcified peripheral vascular disease. Electronically Signed   By: Genevie Ann M.D.   On: 09/13/2015 16:29   Dg Femur Min 2 Views Left  Result Date: 09/13/2015 CLINICAL DATA:  Portable imaging following left intertrochanteric proximal femur fracture ORIF EXAM: LEFT FEMUR 2  VIEWS; DG C-ARM 61-120 MIN COMPARISON:  09/13/2015 at 2:59 a.m. FINDINGS: Images show placement long intra medullary rod supporting a compression screw. The major fracture components have been reduced into  near anatomic alignment. No new fracture or evidence of an operative complication. IMPRESSION: Well aligned major fracture components following ORIF of the left proximal femur intertrochanteric fracture. Electronically Signed   By: Lajean Manes M.D.   On: 09/13/2015 15:24    Assessment/Plan 1. Pain, postoperative, acute  Continue fentanyl 12 mcg patch- change q 3 days  Continue prn oxycodone 5 mg 1-2 tablets po Q 3 hours prn for pain  Continue scheduled tylenol 650 mg po TID  Ice to right hip prn  2. Atrial fibrillation, rapid ([place])  Rate well controlled with current regimen  Continue current regimen of metoprolol 50 mg BID  Pain control- to decrease risk of tachycardia  3. Personal history of fall  Continue fall precautions already in place  Frequent checks  Frequent reminders to call for help  Call bell in reach  Low bed  Bed in lowest position  Fall mats in place  Toilet frequently  4. Blister of Left Heel, initial encounter  Allevyn dressing to left heel. Change Q 5 days  Prevelon boots to B-feet when in bed. Monitor carefully for pt attempting to get up while wearing  Elevate heels in bed if prevelon boots not on  Turn Q 2 hours while in bed  Ensure Geomat on bed.  Family/ staff Communication:   Total Time:  Documentation:  Face to Face:  Family/Phone: via Hospice nurse   Labs/tests ordered:  none  Medication list reviewed and assessed for continued appropriateness. Monthly medication orders reviewed and signed.  Vikki Ports, NP-C Geriatrics [facility] [phone], [place] [address] Cell Phone (Mon-Fri 8am-5pm):  [phone] On Call:  [phone] & follow prompts after 5pm &  weekends Office Phone:  [phone] Office Fax:  [phone]

## 2015-10-05 DIAGNOSIS — E119 Type 2 diabetes mellitus without complications: Secondary | ICD-10-CM | POA: Diagnosis not present

## 2015-10-05 LAB — GLUCOSE, CAPILLARY
GLUCOSE-CAPILLARY: 230 mg/dL — AB (ref 65–99)
Glucose-Capillary: 180 mg/dL — ABNORMAL HIGH (ref 65–99)

## 2015-10-25 DIAGNOSIS — E119 Type 2 diabetes mellitus without complications: Secondary | ICD-10-CM | POA: Diagnosis not present

## 2015-10-25 LAB — GLUCOSE, CAPILLARY
GLUCOSE-CAPILLARY: 106 mg/dL — AB (ref 65–99)
GLUCOSE-CAPILLARY: 168 mg/dL — AB (ref 65–99)
GLUCOSE-CAPILLARY: 227 mg/dL — AB (ref 65–99)

## 2015-10-26 DIAGNOSIS — E119 Type 2 diabetes mellitus without complications: Secondary | ICD-10-CM | POA: Diagnosis not present

## 2015-10-26 LAB — GLUCOSE, CAPILLARY
GLUCOSE-CAPILLARY: 243 mg/dL — AB (ref 65–99)
GLUCOSE-CAPILLARY: 325 mg/dL — AB (ref 65–99)
Glucose-Capillary: 294 mg/dL — ABNORMAL HIGH (ref 65–99)

## 2015-10-27 DIAGNOSIS — E119 Type 2 diabetes mellitus without complications: Secondary | ICD-10-CM | POA: Diagnosis not present

## 2015-10-27 LAB — GLUCOSE, CAPILLARY
GLUCOSE-CAPILLARY: 212 mg/dL — AB (ref 65–99)
GLUCOSE-CAPILLARY: 283 mg/dL — AB (ref 65–99)
GLUCOSE-CAPILLARY: 286 mg/dL — AB (ref 65–99)

## 2015-10-28 DIAGNOSIS — E119 Type 2 diabetes mellitus without complications: Secondary | ICD-10-CM | POA: Diagnosis not present

## 2015-10-28 LAB — GLUCOSE, CAPILLARY
GLUCOSE-CAPILLARY: 170 mg/dL — AB (ref 65–99)
GLUCOSE-CAPILLARY: 274 mg/dL — AB (ref 65–99)
Glucose-Capillary: 345 mg/dL — ABNORMAL HIGH (ref 65–99)
Glucose-Capillary: 327 mg/dL — ABNORMAL HIGH (ref 65–99)

## 2015-10-29 ENCOUNTER — Inpatient Hospital Stay: Admit: 2015-10-29 | Payer: Self-pay

## 2015-10-29 ENCOUNTER — Encounter
Admission: RE | Admit: 2015-10-29 | Discharge: 2015-10-29 | Disposition: A | Discharge status: Home / Self Care | Source: Ambulatory Visit | Attending: Internal Medicine | Admitting: Internal Medicine

## 2015-10-29 DIAGNOSIS — E119 Type 2 diabetes mellitus without complications: Secondary | ICD-10-CM | POA: Insufficient documentation

## 2015-10-29 LAB — GLUCOSE, CAPILLARY
GLUCOSE-CAPILLARY: 162 mg/dL — AB (ref 65–99)
GLUCOSE-CAPILLARY: 210 mg/dL — AB (ref 65–99)
Glucose-Capillary: 288 mg/dL — ABNORMAL HIGH (ref 65–99)
Glucose-Capillary: 292 mg/dL — ABNORMAL HIGH (ref 65–99)

## 2015-11-20 LAB — GLUCOSE, CAPILLARY
GLUCOSE-CAPILLARY: 355 mg/dL — AB (ref 65–99)
Glucose-Capillary: 281 mg/dL — ABNORMAL HIGH (ref 65–99)
Glucose-Capillary: 254 mg/dL — ABNORMAL HIGH (ref 65–99)

## 2015-11-21 LAB — GLUCOSE, CAPILLARY
GLUCOSE-CAPILLARY: 187 mg/dL — AB (ref 65–99)
Glucose-Capillary: 337 mg/dL — ABNORMAL HIGH (ref 65–99)
Glucose-Capillary: 453 mg/dL — ABNORMAL HIGH (ref 65–99)
Glucose-Capillary: 302 mg/dL — ABNORMAL HIGH (ref 65–99)

## 2015-11-22 LAB — GLUCOSE, CAPILLARY
GLUCOSE-CAPILLARY: 208 mg/dL — AB (ref 65–99)
GLUCOSE-CAPILLARY: 349 mg/dL — AB (ref 65–99)
Glucose-Capillary: 247 mg/dL — ABNORMAL HIGH (ref 65–99)
Glucose-Capillary: 252 mg/dL — ABNORMAL HIGH (ref 65–99)

## 2015-11-23 LAB — GLUCOSE, CAPILLARY
Glucose-Capillary: 189 mg/dL — ABNORMAL HIGH (ref 65–99)
Glucose-Capillary: 267 mg/dL — ABNORMAL HIGH (ref 65–99)
Glucose-Capillary: 267 mg/dL — ABNORMAL HIGH (ref 65–99)

## 2015-11-24 LAB — GLUCOSE, CAPILLARY
GLUCOSE-CAPILLARY: 198 mg/dL — AB (ref 65–99)
Glucose-Capillary: 231 mg/dL — ABNORMAL HIGH (ref 65–99)
Glucose-Capillary: 306 mg/dL — ABNORMAL HIGH (ref 65–99)

## 2015-11-25 LAB — GLUCOSE, CAPILLARY
GLUCOSE-CAPILLARY: 493 mg/dL — AB (ref 65–99)
GLUCOSE-CAPILLARY: 386 mg/dL — AB (ref 65–99)
Glucose-Capillary: 225 mg/dL — ABNORMAL HIGH (ref 65–99)

## 2015-11-26 LAB — GLUCOSE, CAPILLARY
Glucose-Capillary: 236 mg/dL — ABNORMAL HIGH (ref 65–99)
Glucose-Capillary: 371 mg/dL — ABNORMAL HIGH (ref 65–99)
Glucose-Capillary: 356 mg/dL — ABNORMAL HIGH (ref 65–99)
Glucose-Capillary: 391 mg/dL — ABNORMAL HIGH (ref 65–99)

## 2015-11-27 LAB — GLUCOSE, CAPILLARY
GLUCOSE-CAPILLARY: 267 mg/dL — AB (ref 65–99)
GLUCOSE-CAPILLARY: 331 mg/dL — AB (ref 65–99)
Glucose-Capillary: 239 mg/dL — ABNORMAL HIGH (ref 65–99)
Glucose-Capillary: 302 mg/dL — ABNORMAL HIGH (ref 65–99)

## 2015-11-28 LAB — GLUCOSE, CAPILLARY
GLUCOSE-CAPILLARY: 254 mg/dL — AB (ref 65–99)
GLUCOSE-CAPILLARY: 354 mg/dL — AB (ref 65–99)
GLUCOSE-CAPILLARY: 278 mg/dL — AB (ref 65–99)
Glucose-Capillary: 176 mg/dL — ABNORMAL HIGH (ref 65–99)

## 2015-11-29 ENCOUNTER — Encounter
Admission: RE | Admit: 2015-11-29 | Discharge: 2015-11-29 | Disposition: A | Discharge status: Home / Self Care | Payer: Medicare Other | Source: Ambulatory Visit | Attending: Internal Medicine | Admitting: Internal Medicine

## 2015-11-29 LAB — GLUCOSE, CAPILLARY
GLUCOSE-CAPILLARY: 289 mg/dL — AB (ref 65–99)
GLUCOSE-CAPILLARY: 280 mg/dL — AB (ref 65–99)

## 2015-11-30 LAB — GLUCOSE, CAPILLARY
GLUCOSE-CAPILLARY: 236 mg/dL — AB (ref 65–99)
Glucose-Capillary: 179 mg/dL — ABNORMAL HIGH (ref 65–99)
Glucose-Capillary: 241 mg/dL — ABNORMAL HIGH (ref 65–99)
Glucose-Capillary: 278 mg/dL — ABNORMAL HIGH (ref 65–99)

## 2015-12-01 LAB — GLUCOSE, CAPILLARY
GLUCOSE-CAPILLARY: 146 mg/dL — AB (ref 65–99)
GLUCOSE-CAPILLARY: 181 mg/dL — AB (ref 65–99)
GLUCOSE-CAPILLARY: 204 mg/dL — AB (ref 65–99)
GLUCOSE-CAPILLARY: 135 mg/dL — AB (ref 65–99)

## 2015-12-02 LAB — GLUCOSE, CAPILLARY
GLUCOSE-CAPILLARY: 82 mg/dL (ref 65–99)
GLUCOSE-CAPILLARY: 97 mg/dL (ref 65–99)
Glucose-Capillary: 149 mg/dL — ABNORMAL HIGH (ref 65–99)
Glucose-Capillary: 129 mg/dL — ABNORMAL HIGH (ref 65–99)

## 2015-12-03 LAB — GLUCOSE, CAPILLARY
GLUCOSE-CAPILLARY: 268 mg/dL — AB (ref 65–99)
Glucose-Capillary: 109 mg/dL — ABNORMAL HIGH (ref 65–99)
Glucose-Capillary: 184 mg/dL — ABNORMAL HIGH (ref 65–99)
Glucose-Capillary: 156 mg/dL — ABNORMAL HIGH (ref 65–99)

## 2015-12-04 LAB — GLUCOSE, CAPILLARY
GLUCOSE-CAPILLARY: 130 mg/dL — AB (ref 65–99)
GLUCOSE-CAPILLARY: 201 mg/dL — AB (ref 65–99)
GLUCOSE-CAPILLARY: 290 mg/dL — AB (ref 65–99)
Glucose-Capillary: 288 mg/dL — ABNORMAL HIGH (ref 65–99)

## 2015-12-05 LAB — GLUCOSE, CAPILLARY
GLUCOSE-CAPILLARY: 195 mg/dL — AB (ref 65–99)
GLUCOSE-CAPILLARY: 193 mg/dL — AB (ref 65–99)
Glucose-Capillary: 319 mg/dL — ABNORMAL HIGH (ref 65–99)
Glucose-Capillary: 300 mg/dL — ABNORMAL HIGH (ref 65–99)

## 2015-12-06 LAB — GLUCOSE, CAPILLARY
GLUCOSE-CAPILLARY: 314 mg/dL — AB (ref 65–99)
Glucose-Capillary: 198 mg/dL — ABNORMAL HIGH (ref 65–99)
Glucose-Capillary: 164 mg/dL — ABNORMAL HIGH (ref 65–99)
Glucose-Capillary: 285 mg/dL — ABNORMAL HIGH (ref 65–99)

## 2015-12-07 LAB — GLUCOSE, CAPILLARY
GLUCOSE-CAPILLARY: 173 mg/dL — AB (ref 65–99)
GLUCOSE-CAPILLARY: 288 mg/dL — AB (ref 65–99)
GLUCOSE-CAPILLARY: 287 mg/dL — AB (ref 65–99)
Glucose-Capillary: 245 mg/dL — ABNORMAL HIGH (ref 65–99)

## 2015-12-08 LAB — GLUCOSE, CAPILLARY
GLUCOSE-CAPILLARY: 213 mg/dL — AB (ref 65–99)
Glucose-Capillary: 232 mg/dL — ABNORMAL HIGH (ref 65–99)
Glucose-Capillary: 211 mg/dL — ABNORMAL HIGH (ref 65–99)
Glucose-Capillary: 273 mg/dL — ABNORMAL HIGH (ref 65–99)

## 2015-12-09 LAB — GLUCOSE, CAPILLARY
GLUCOSE-CAPILLARY: 116 mg/dL — AB (ref 65–99)
GLUCOSE-CAPILLARY: 210 mg/dL — AB (ref 65–99)
GLUCOSE-CAPILLARY: 273 mg/dL — AB (ref 65–99)
GLUCOSE-CAPILLARY: 242 mg/dL — AB (ref 65–99)

## 2015-12-10 LAB — GLUCOSE, CAPILLARY
GLUCOSE-CAPILLARY: 192 mg/dL — AB (ref 65–99)
GLUCOSE-CAPILLARY: 164 mg/dL — AB (ref 65–99)
Glucose-Capillary: 106 mg/dL — ABNORMAL HIGH (ref 65–99)
Glucose-Capillary: 199 mg/dL — ABNORMAL HIGH (ref 65–99)

## 2015-12-11 LAB — GLUCOSE, CAPILLARY
GLUCOSE-CAPILLARY: 179 mg/dL — AB (ref 65–99)
Glucose-Capillary: 83 mg/dL (ref 65–99)
Glucose-Capillary: 178 mg/dL — ABNORMAL HIGH (ref 65–99)
Glucose-Capillary: 121 mg/dL — ABNORMAL HIGH (ref 65–99)

## 2015-12-12 LAB — GLUCOSE, CAPILLARY
GLUCOSE-CAPILLARY: 170 mg/dL — AB (ref 65–99)
GLUCOSE-CAPILLARY: 177 mg/dL — AB (ref 65–99)
Glucose-Capillary: 211 mg/dL — ABNORMAL HIGH (ref 65–99)
Glucose-Capillary: 195 mg/dL — ABNORMAL HIGH (ref 65–99)

## 2015-12-13 LAB — GLUCOSE, CAPILLARY
GLUCOSE-CAPILLARY: 169 mg/dL — AB (ref 65–99)
GLUCOSE-CAPILLARY: 256 mg/dL — AB (ref 65–99)
Glucose-Capillary: 103 mg/dL — ABNORMAL HIGH (ref 65–99)

## 2015-12-14 LAB — GLUCOSE, CAPILLARY
GLUCOSE-CAPILLARY: 102 mg/dL — AB (ref 65–99)
Glucose-Capillary: 143 mg/dL — ABNORMAL HIGH (ref 65–99)
Glucose-Capillary: 221 mg/dL — ABNORMAL HIGH (ref 65–99)
Glucose-Capillary: 161 mg/dL — ABNORMAL HIGH (ref 65–99)

## 2015-12-15 LAB — GLUCOSE, CAPILLARY
Glucose-Capillary: 72 mg/dL (ref 65–99)
Glucose-Capillary: 102 mg/dL — ABNORMAL HIGH (ref 65–99)
Glucose-Capillary: 152 mg/dL — ABNORMAL HIGH (ref 65–99)
Glucose-Capillary: 206 mg/dL — ABNORMAL HIGH (ref 65–99)

## 2015-12-16 LAB — GLUCOSE, CAPILLARY
GLUCOSE-CAPILLARY: 160 mg/dL — AB (ref 65–99)
Glucose-Capillary: 179 mg/dL — ABNORMAL HIGH (ref 65–99)
Glucose-Capillary: 238 mg/dL — ABNORMAL HIGH (ref 65–99)

## 2015-12-17 LAB — GLUCOSE, CAPILLARY
GLUCOSE-CAPILLARY: 206 mg/dL — AB (ref 65–99)
GLUCOSE-CAPILLARY: 290 mg/dL — AB (ref 65–99)
Glucose-Capillary: 198 mg/dL — ABNORMAL HIGH (ref 65–99)
Glucose-Capillary: 245 mg/dL — ABNORMAL HIGH (ref 65–99)

## 2015-12-18 LAB — GLUCOSE, CAPILLARY
GLUCOSE-CAPILLARY: 227 mg/dL — AB (ref 65–99)
Glucose-Capillary: 214 mg/dL — ABNORMAL HIGH (ref 65–99)
Glucose-Capillary: 211 mg/dL — ABNORMAL HIGH (ref 65–99)
Glucose-Capillary: 195 mg/dL — ABNORMAL HIGH (ref 65–99)

## 2015-12-19 LAB — GLUCOSE, CAPILLARY
GLUCOSE-CAPILLARY: 87 mg/dL (ref 65–99)
GLUCOSE-CAPILLARY: 153 mg/dL — AB (ref 65–99)
Glucose-Capillary: 189 mg/dL — ABNORMAL HIGH (ref 65–99)
Glucose-Capillary: 285 mg/dL — ABNORMAL HIGH (ref 65–99)

## 2015-12-20 LAB — GLUCOSE, CAPILLARY
GLUCOSE-CAPILLARY: 61 mg/dL — AB (ref 65–99)
GLUCOSE-CAPILLARY: 79 mg/dL (ref 65–99)
GLUCOSE-CAPILLARY: 194 mg/dL — AB (ref 65–99)
GLUCOSE-CAPILLARY: 302 mg/dL — AB (ref 65–99)
Glucose-Capillary: 78 mg/dL (ref 65–99)

## 2015-12-21 LAB — GLUCOSE, CAPILLARY
GLUCOSE-CAPILLARY: 173 mg/dL — AB (ref 65–99)
Glucose-Capillary: 150 mg/dL — ABNORMAL HIGH (ref 65–99)
Glucose-Capillary: 190 mg/dL — ABNORMAL HIGH (ref 65–99)
Glucose-Capillary: 236 mg/dL — ABNORMAL HIGH (ref 65–99)

## 2015-12-22 LAB — GLUCOSE, CAPILLARY
GLUCOSE-CAPILLARY: 123 mg/dL — AB (ref 65–99)
Glucose-Capillary: 194 mg/dL — ABNORMAL HIGH (ref 65–99)
Glucose-Capillary: 346 mg/dL — ABNORMAL HIGH (ref 65–99)

## 2015-12-22 IMAGING — CT CT HEAD W/O CM
2 series · 14 of 30 positions shown, 16 images · non-contrast
Comparison: 08/02/2014

CLINICAL DATA: Fall, head injury. Hit back of head on the right.
Vertigo.

EXAM:
CT HEAD WITHOUT CONTRAST
TECHNIQUE: Contiguous axial images were obtained from the base of the skull
through the vertex without intravenous contrast.

[Series 2: head wo · axial · 0.42mm/px · z∈[-168,-63]mm · 6 of 31 slices shown, 8 images]
[im 5/31  brain]
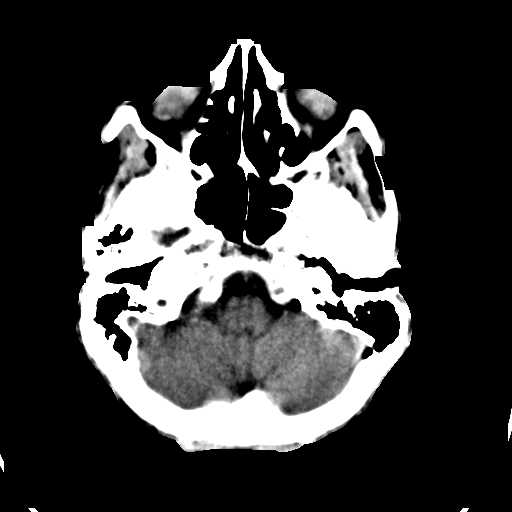
[im 5/31  bone]
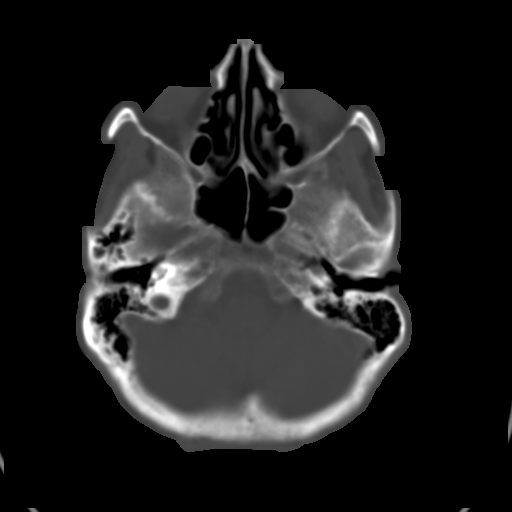
[im 9/31  brain]
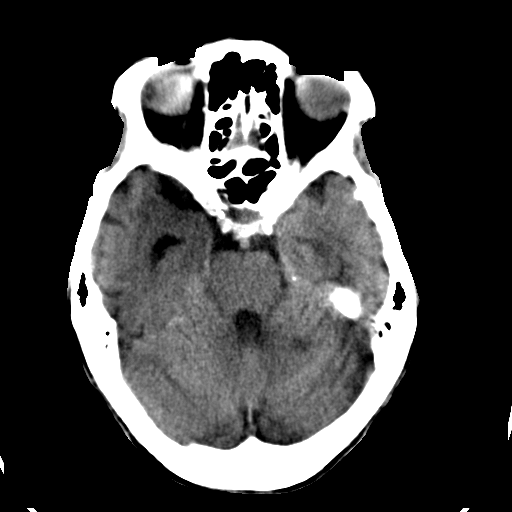
[im 13/31  brain]
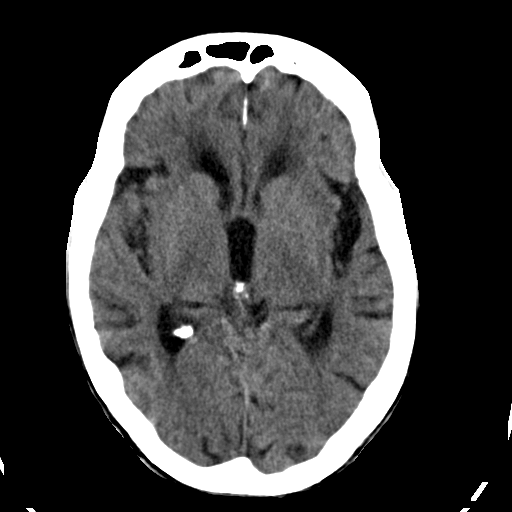
[im 18/31  brain]
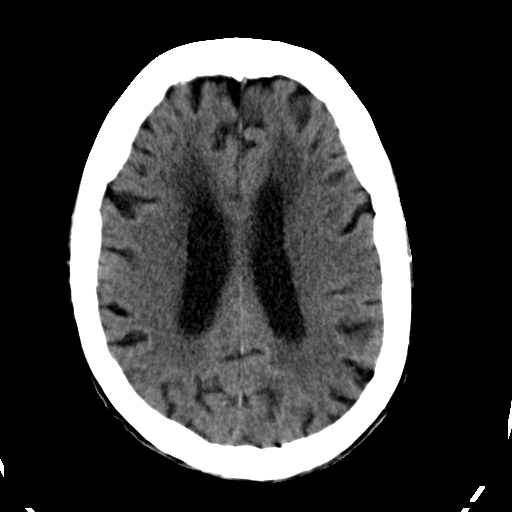
[im 22/31  brain]
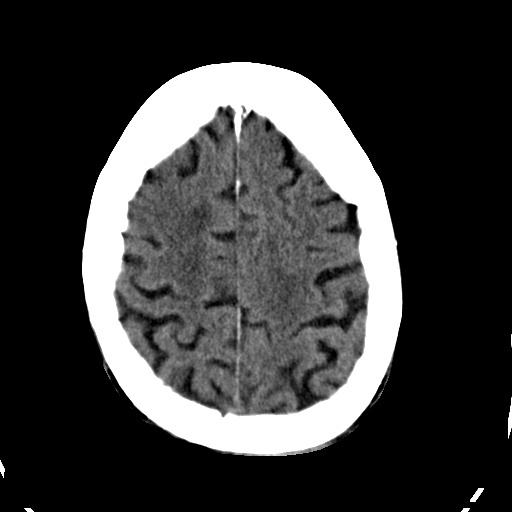
[im 22/31  bone]
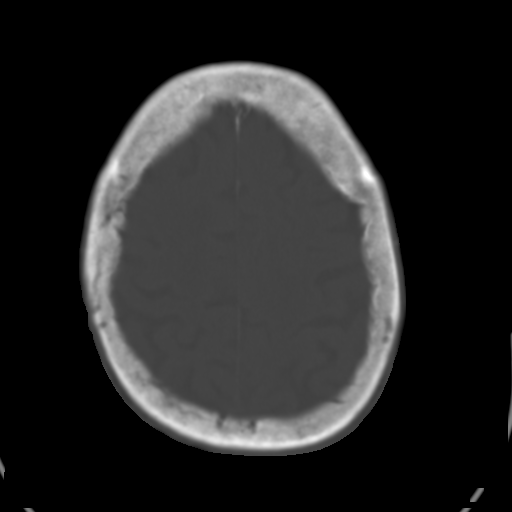
[im 26/31  brain]
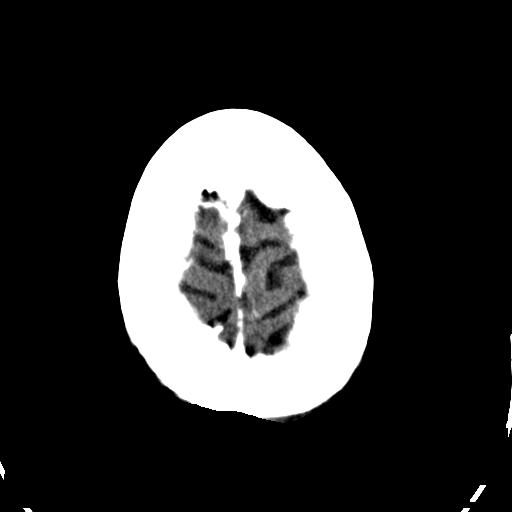

[Series 3: head bone · axial · 0.42mm/px · z∈[-182,-44]mm · 8 of 87 slices shown]
[im 9/87  bone]
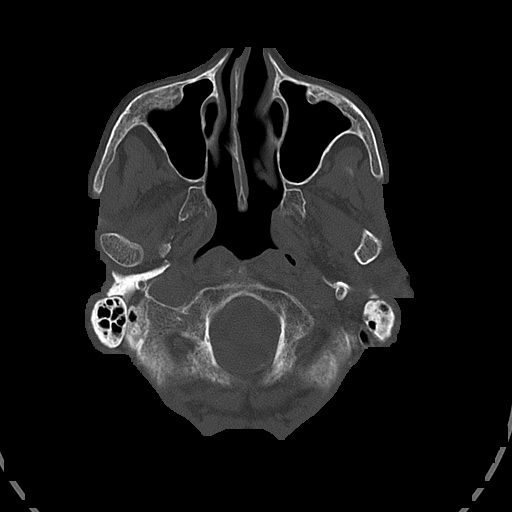
[im 17/87  bone]
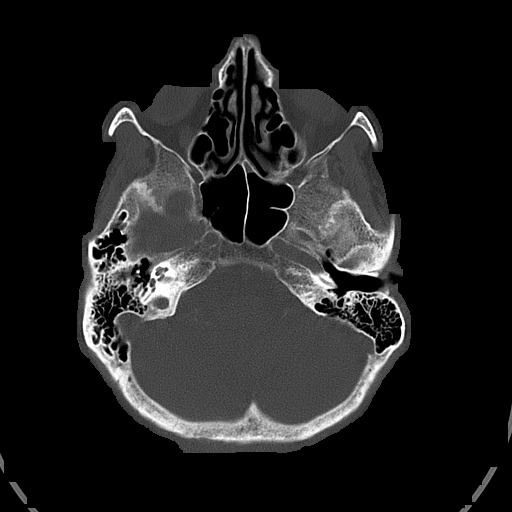
[im 29/87  bone]
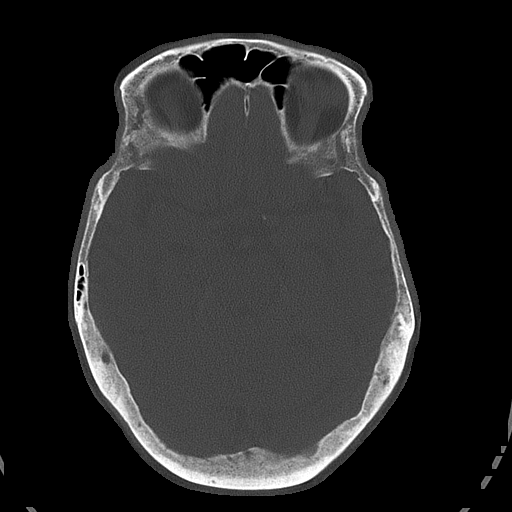
[im 37/87  bone]
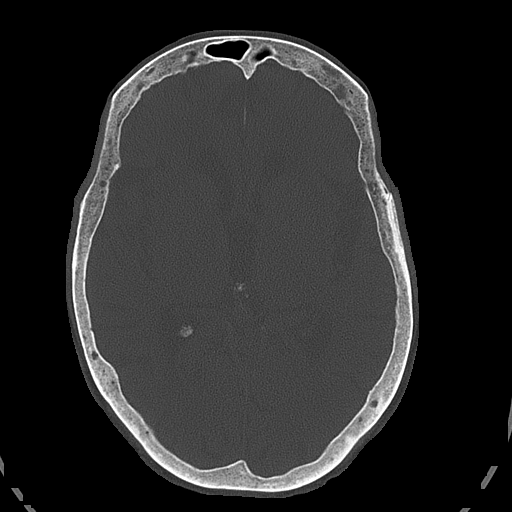
[im 50/87  bone]
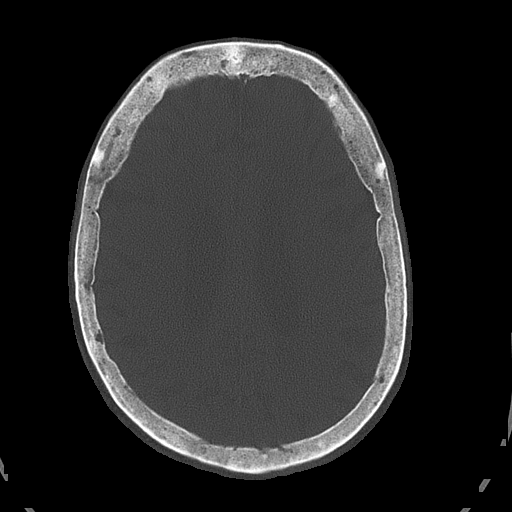
[im 58/87  bone]
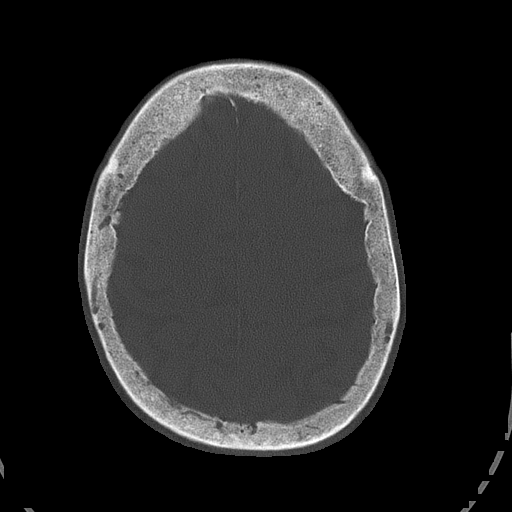
[im 70/87  bone]
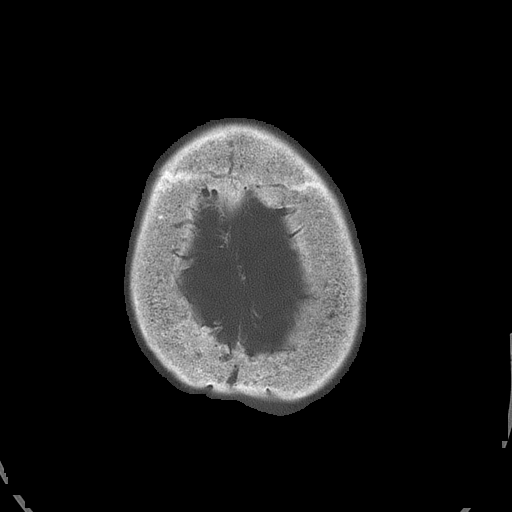
[im 78/87  bone]
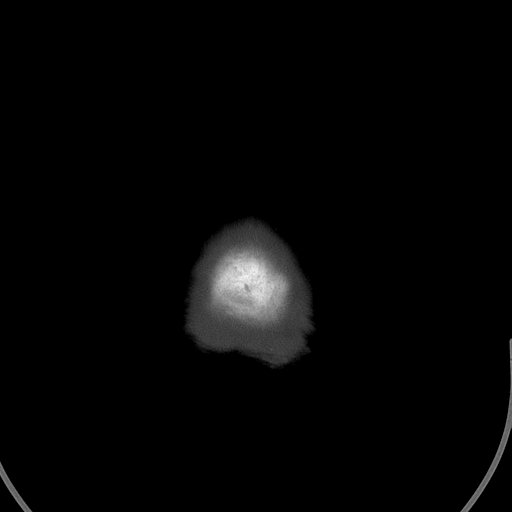

[14 of 30 positions shown; findings below may reference images not displayed]

FINDINGS: Posterior soft tissue swelling/hematoma. No underlying calvarial
abnormality. There is atrophy and chronic small vessel disease
changes. No acute intracranial abnormality. Specifically, no
hemorrhage, hydrocephalus, mass lesion, acute infarction, or
significant intracranial injury. No acute calvarial abnormality.
Visualized paranasal sinuses and mastoids clear. Orbital soft
tissues unremarkable.
IMPRESSION: No acute intracranial abnormality.

Atrophy, chronic microvascular disease.

## 2015-12-23 LAB — GLUCOSE, CAPILLARY
Glucose-Capillary: 125 mg/dL — ABNORMAL HIGH (ref 65–99)
Glucose-Capillary: 262 mg/dL — ABNORMAL HIGH (ref 65–99)
Glucose-Capillary: 160 mg/dL — ABNORMAL HIGH (ref 65–99)
Glucose-Capillary: 253 mg/dL — ABNORMAL HIGH (ref 65–99)
Glucose-Capillary: 202 mg/dL — ABNORMAL HIGH (ref 65–99)

## 2015-12-24 LAB — GLUCOSE, CAPILLARY
GLUCOSE-CAPILLARY: 161 mg/dL — AB (ref 65–99)
GLUCOSE-CAPILLARY: 251 mg/dL — AB (ref 65–99)
Glucose-Capillary: 78 mg/dL (ref 65–99)
Glucose-Capillary: 218 mg/dL — ABNORMAL HIGH (ref 65–99)

## 2015-12-25 LAB — GLUCOSE, CAPILLARY
GLUCOSE-CAPILLARY: 84 mg/dL (ref 65–99)
GLUCOSE-CAPILLARY: 37 mg/dL — AB (ref 65–99)
GLUCOSE-CAPILLARY: 38 mg/dL — AB (ref 65–99)
GLUCOSE-CAPILLARY: 41 mg/dL — AB (ref 65–99)
Glucose-Capillary: 80 mg/dL (ref 65–99)
Glucose-Capillary: 40 mg/dL — CL (ref 65–99)
Glucose-Capillary: 70 mg/dL (ref 65–99)
Glucose-Capillary: 290 mg/dL — ABNORMAL HIGH (ref 65–99)

## 2015-12-26 LAB — GLUCOSE, CAPILLARY
GLUCOSE-CAPILLARY: 167 mg/dL — AB (ref 65–99)
Glucose-Capillary: 185 mg/dL — ABNORMAL HIGH (ref 65–99)
Glucose-Capillary: 171 mg/dL — ABNORMAL HIGH (ref 65–99)

## 2015-12-27 LAB — GLUCOSE, CAPILLARY
GLUCOSE-CAPILLARY: 95 mg/dL (ref 65–99)
GLUCOSE-CAPILLARY: 170 mg/dL — AB (ref 65–99)
Glucose-Capillary: 263 mg/dL — ABNORMAL HIGH (ref 65–99)
Glucose-Capillary: 229 mg/dL — ABNORMAL HIGH (ref 65–99)

## 2015-12-28 LAB — GLUCOSE, CAPILLARY
GLUCOSE-CAPILLARY: 109 mg/dL — AB (ref 65–99)
GLUCOSE-CAPILLARY: 265 mg/dL — AB (ref 65–99)
GLUCOSE-CAPILLARY: 170 mg/dL — AB (ref 65–99)
Glucose-Capillary: 88 mg/dL (ref 65–99)

## 2015-12-29 ENCOUNTER — Encounter
Admission: RE | Admit: 2015-12-29 | Discharge: 2015-12-29 | Disposition: A | Discharge status: Home / Self Care | Payer: Medicare Other | Source: Ambulatory Visit | Attending: Internal Medicine | Admitting: Internal Medicine

## 2015-12-29 LAB — GLUCOSE, CAPILLARY
GLUCOSE-CAPILLARY: 193 mg/dL — AB (ref 65–99)
GLUCOSE-CAPILLARY: 282 mg/dL — AB (ref 65–99)
Glucose-Capillary: 76 mg/dL (ref 65–99)
Glucose-Capillary: 260 mg/dL — ABNORMAL HIGH (ref 65–99)

## 2016-01-09 LAB — BASIC METABOLIC PANEL
ANION GAP: 5 (ref 5–15)
BUN: 30 mg/dL — ABNORMAL HIGH (ref 6–20)
CALCIUM: 9.9 mg/dL (ref 8.9–10.3)
CO2: 31 mmol/L (ref 22–32)
CREATININE: 0.47 mg/dL (ref 0.44–1.00)
Chloride: 96 mmol/L — ABNORMAL LOW (ref 101–111)
Glucose, Bld: 146 mg/dL — ABNORMAL HIGH (ref 65–99)
Potassium: 4.3 mmol/L (ref 3.5–5.1)
SODIUM: 132 mmol/L — AB (ref 135–145)

## 2016-01-20 LAB — GLUCOSE, CAPILLARY
Glucose-Capillary: 132 mg/dL — ABNORMAL HIGH (ref 65–99)
Glucose-Capillary: 133 mg/dL — ABNORMAL HIGH (ref 65–99)
Glucose-Capillary: 259 mg/dL — ABNORMAL HIGH (ref 65–99)
Glucose-Capillary: 223 mg/dL — ABNORMAL HIGH (ref 65–99)

## 2016-01-21 LAB — GLUCOSE, CAPILLARY
GLUCOSE-CAPILLARY: 73 mg/dL (ref 65–99)
GLUCOSE-CAPILLARY: 182 mg/dL — AB (ref 65–99)
Glucose-Capillary: 264 mg/dL — ABNORMAL HIGH (ref 65–99)
Glucose-Capillary: 190 mg/dL — ABNORMAL HIGH (ref 65–99)

## 2016-01-22 LAB — GLUCOSE, CAPILLARY
GLUCOSE-CAPILLARY: 122 mg/dL — AB (ref 65–99)
GLUCOSE-CAPILLARY: 222 mg/dL — AB (ref 65–99)
GLUCOSE-CAPILLARY: 229 mg/dL — AB (ref 65–99)

## 2016-01-23 LAB — GLUCOSE, CAPILLARY
GLUCOSE-CAPILLARY: 157 mg/dL — AB (ref 65–99)
Glucose-Capillary: 126 mg/dL — ABNORMAL HIGH (ref 65–99)
Glucose-Capillary: 164 mg/dL — ABNORMAL HIGH (ref 65–99)
Glucose-Capillary: 106 mg/dL — ABNORMAL HIGH (ref 65–99)

## 2016-01-24 LAB — GLUCOSE, CAPILLARY
GLUCOSE-CAPILLARY: 127 mg/dL — AB (ref 65–99)
GLUCOSE-CAPILLARY: 154 mg/dL — AB (ref 65–99)
GLUCOSE-CAPILLARY: 108 mg/dL — AB (ref 65–99)
Glucose-Capillary: 64 mg/dL — ABNORMAL LOW (ref 65–99)
Glucose-Capillary: 102 mg/dL — ABNORMAL HIGH (ref 65–99)

## 2016-01-25 LAB — GLUCOSE, CAPILLARY
GLUCOSE-CAPILLARY: 109 mg/dL — AB (ref 65–99)
GLUCOSE-CAPILLARY: 57 mg/dL — AB (ref 65–99)
GLUCOSE-CAPILLARY: 145 mg/dL — AB (ref 65–99)
GLUCOSE-CAPILLARY: 123 mg/dL — AB (ref 65–99)
Glucose-Capillary: 120 mg/dL — ABNORMAL HIGH (ref 65–99)

## 2016-01-26 LAB — GLUCOSE, CAPILLARY
GLUCOSE-CAPILLARY: 101 mg/dL — AB (ref 65–99)
GLUCOSE-CAPILLARY: 56 mg/dL — AB (ref 65–99)
GLUCOSE-CAPILLARY: 333 mg/dL — AB (ref 65–99)
GLUCOSE-CAPILLARY: 253 mg/dL — AB (ref 65–99)
Glucose-Capillary: 46 mg/dL — ABNORMAL LOW (ref 65–99)
Glucose-Capillary: 85 mg/dL (ref 65–99)

## 2016-01-27 LAB — GLUCOSE, CAPILLARY
GLUCOSE-CAPILLARY: 110 mg/dL — AB (ref 65–99)
GLUCOSE-CAPILLARY: 69 mg/dL (ref 65–99)
GLUCOSE-CAPILLARY: 127 mg/dL — AB (ref 65–99)
GLUCOSE-CAPILLARY: 138 mg/dL — AB (ref 65–99)

## 2016-01-28 LAB — URINALYSIS, COMPLETE (UACMP) WITH MICROSCOPIC
Bilirubin Urine: NEGATIVE
GLUCOSE, UA: NEGATIVE mg/dL
Ketones, ur: NEGATIVE mg/dL
Nitrite: NEGATIVE
PH: 5 (ref 5.0–8.0)
PROTEIN: NEGATIVE mg/dL
SQUAMOUS EPITHELIAL / LPF: NONE SEEN
Specific Gravity, Urine: 1.013 (ref 1.005–1.030)

## 2016-01-28 LAB — GLUCOSE, CAPILLARY
GLUCOSE-CAPILLARY: 74 mg/dL (ref 65–99)
GLUCOSE-CAPILLARY: 163 mg/dL — AB (ref 65–99)
GLUCOSE-CAPILLARY: 178 mg/dL — AB (ref 65–99)
Glucose-Capillary: 138 mg/dL — ABNORMAL HIGH (ref 65–99)

## 2016-01-29 ENCOUNTER — Encounter
Admission: RE | Admit: 2016-01-29 | Discharge: 2016-01-29 | Disposition: A | Discharge status: Home / Self Care | Payer: Medicare Other | Source: Ambulatory Visit | Attending: Internal Medicine | Admitting: Internal Medicine

## 2016-01-29 LAB — GLUCOSE, CAPILLARY
GLUCOSE-CAPILLARY: 138 mg/dL — AB (ref 65–99)
Glucose-Capillary: 181 mg/dL — ABNORMAL HIGH (ref 65–99)

## 2016-01-30 LAB — GLUCOSE, CAPILLARY
Glucose-Capillary: 68 mg/dL (ref 65–99)
Glucose-Capillary: 194 mg/dL — ABNORMAL HIGH (ref 65–99)
Glucose-Capillary: 208 mg/dL — ABNORMAL HIGH (ref 65–99)
Glucose-Capillary: 229 mg/dL — ABNORMAL HIGH (ref 65–99)

## 2016-01-30 LAB — URINE CULTURE

## 2016-01-31 LAB — GLUCOSE, CAPILLARY
GLUCOSE-CAPILLARY: 113 mg/dL — AB (ref 65–99)
Glucose-Capillary: 164 mg/dL — ABNORMAL HIGH (ref 65–99)
Glucose-Capillary: 301 mg/dL — ABNORMAL HIGH (ref 65–99)
Glucose-Capillary: 210 mg/dL — ABNORMAL HIGH (ref 65–99)

## 2016-02-01 LAB — GLUCOSE, CAPILLARY
GLUCOSE-CAPILLARY: 132 mg/dL — AB (ref 65–99)
GLUCOSE-CAPILLARY: 145 mg/dL — AB (ref 65–99)
GLUCOSE-CAPILLARY: 188 mg/dL — AB (ref 65–99)
Glucose-Capillary: 170 mg/dL — ABNORMAL HIGH (ref 65–99)

## 2016-02-02 LAB — GLUCOSE, CAPILLARY
Glucose-Capillary: 85 mg/dL (ref 65–99)
Glucose-Capillary: 119 mg/dL — ABNORMAL HIGH (ref 65–99)
Glucose-Capillary: 163 mg/dL — ABNORMAL HIGH (ref 65–99)
Glucose-Capillary: 87 mg/dL (ref 65–99)

## 2016-02-03 LAB — GLUCOSE, CAPILLARY
GLUCOSE-CAPILLARY: 97 mg/dL (ref 65–99)
GLUCOSE-CAPILLARY: 163 mg/dL — AB (ref 65–99)
Glucose-Capillary: 131 mg/dL — ABNORMAL HIGH (ref 65–99)
Glucose-Capillary: 57 mg/dL — ABNORMAL LOW (ref 65–99)
Glucose-Capillary: 124 mg/dL — ABNORMAL HIGH (ref 65–99)
Glucose-Capillary: 198 mg/dL — ABNORMAL HIGH (ref 65–99)

## 2016-02-04 LAB — GLUCOSE, CAPILLARY
GLUCOSE-CAPILLARY: 108 mg/dL — AB (ref 65–99)
GLUCOSE-CAPILLARY: 166 mg/dL — AB (ref 65–99)
GLUCOSE-CAPILLARY: 198 mg/dL — AB (ref 65–99)
Glucose-Capillary: 67 mg/dL (ref 65–99)

## 2016-02-05 LAB — GLUCOSE, CAPILLARY
GLUCOSE-CAPILLARY: 62 mg/dL — AB (ref 65–99)
GLUCOSE-CAPILLARY: 125 mg/dL — AB (ref 65–99)
GLUCOSE-CAPILLARY: 73 mg/dL (ref 65–99)
Glucose-Capillary: 105 mg/dL — ABNORMAL HIGH (ref 65–99)
Glucose-Capillary: 180 mg/dL — ABNORMAL HIGH (ref 65–99)

## 2016-02-06 LAB — GLUCOSE, CAPILLARY
GLUCOSE-CAPILLARY: 175 mg/dL — AB (ref 65–99)
GLUCOSE-CAPILLARY: 165 mg/dL — AB (ref 65–99)
Glucose-Capillary: 149 mg/dL — ABNORMAL HIGH (ref 65–99)
Glucose-Capillary: 160 mg/dL — ABNORMAL HIGH (ref 65–99)
Glucose-Capillary: 190 mg/dL — ABNORMAL HIGH (ref 65–99)

## 2016-02-07 LAB — GLUCOSE, CAPILLARY
GLUCOSE-CAPILLARY: 263 mg/dL — AB (ref 65–99)
Glucose-Capillary: 172 mg/dL — ABNORMAL HIGH (ref 65–99)
Glucose-Capillary: 361 mg/dL — ABNORMAL HIGH (ref 65–99)
Glucose-Capillary: 287 mg/dL — ABNORMAL HIGH (ref 65–99)

## 2016-02-08 LAB — GLUCOSE, CAPILLARY
GLUCOSE-CAPILLARY: 278 mg/dL — AB (ref 65–99)
GLUCOSE-CAPILLARY: 330 mg/dL — AB (ref 65–99)
GLUCOSE-CAPILLARY: 255 mg/dL — AB (ref 65–99)
Glucose-Capillary: 151 mg/dL — ABNORMAL HIGH (ref 65–99)

## 2016-02-09 LAB — GLUCOSE, CAPILLARY
GLUCOSE-CAPILLARY: 166 mg/dL — AB (ref 65–99)
Glucose-Capillary: 239 mg/dL — ABNORMAL HIGH (ref 65–99)

## 2016-02-15 LAB — GLUCOSE, CAPILLARY: Glucose-Capillary: 357 mg/dL — ABNORMAL HIGH (ref 65–99)

## 2016-02-29 ENCOUNTER — Encounter
Admission: RE | Admit: 2016-02-29 | Discharge: 2016-02-29 | Disposition: A | Discharge status: Home / Self Care | Payer: Medicare Other | Source: Ambulatory Visit | Attending: Internal Medicine | Admitting: Internal Medicine

## 2016-03-28 DEATH — deceased

## 2016-06-03 IMAGING — CR DG TIBIA/FIBULA 2V*L*
1 series · 2 of 2 positions shown · non-contrast
Comparison: None.

CLINICAL DATA: Left lower leg laceration.

EXAM:
LEFT TIBIA AND FIBULA - 2 VIEW

[Series 1: ap · 0.17mm/px · 2 of 2 slices shown]
[im 1/2]
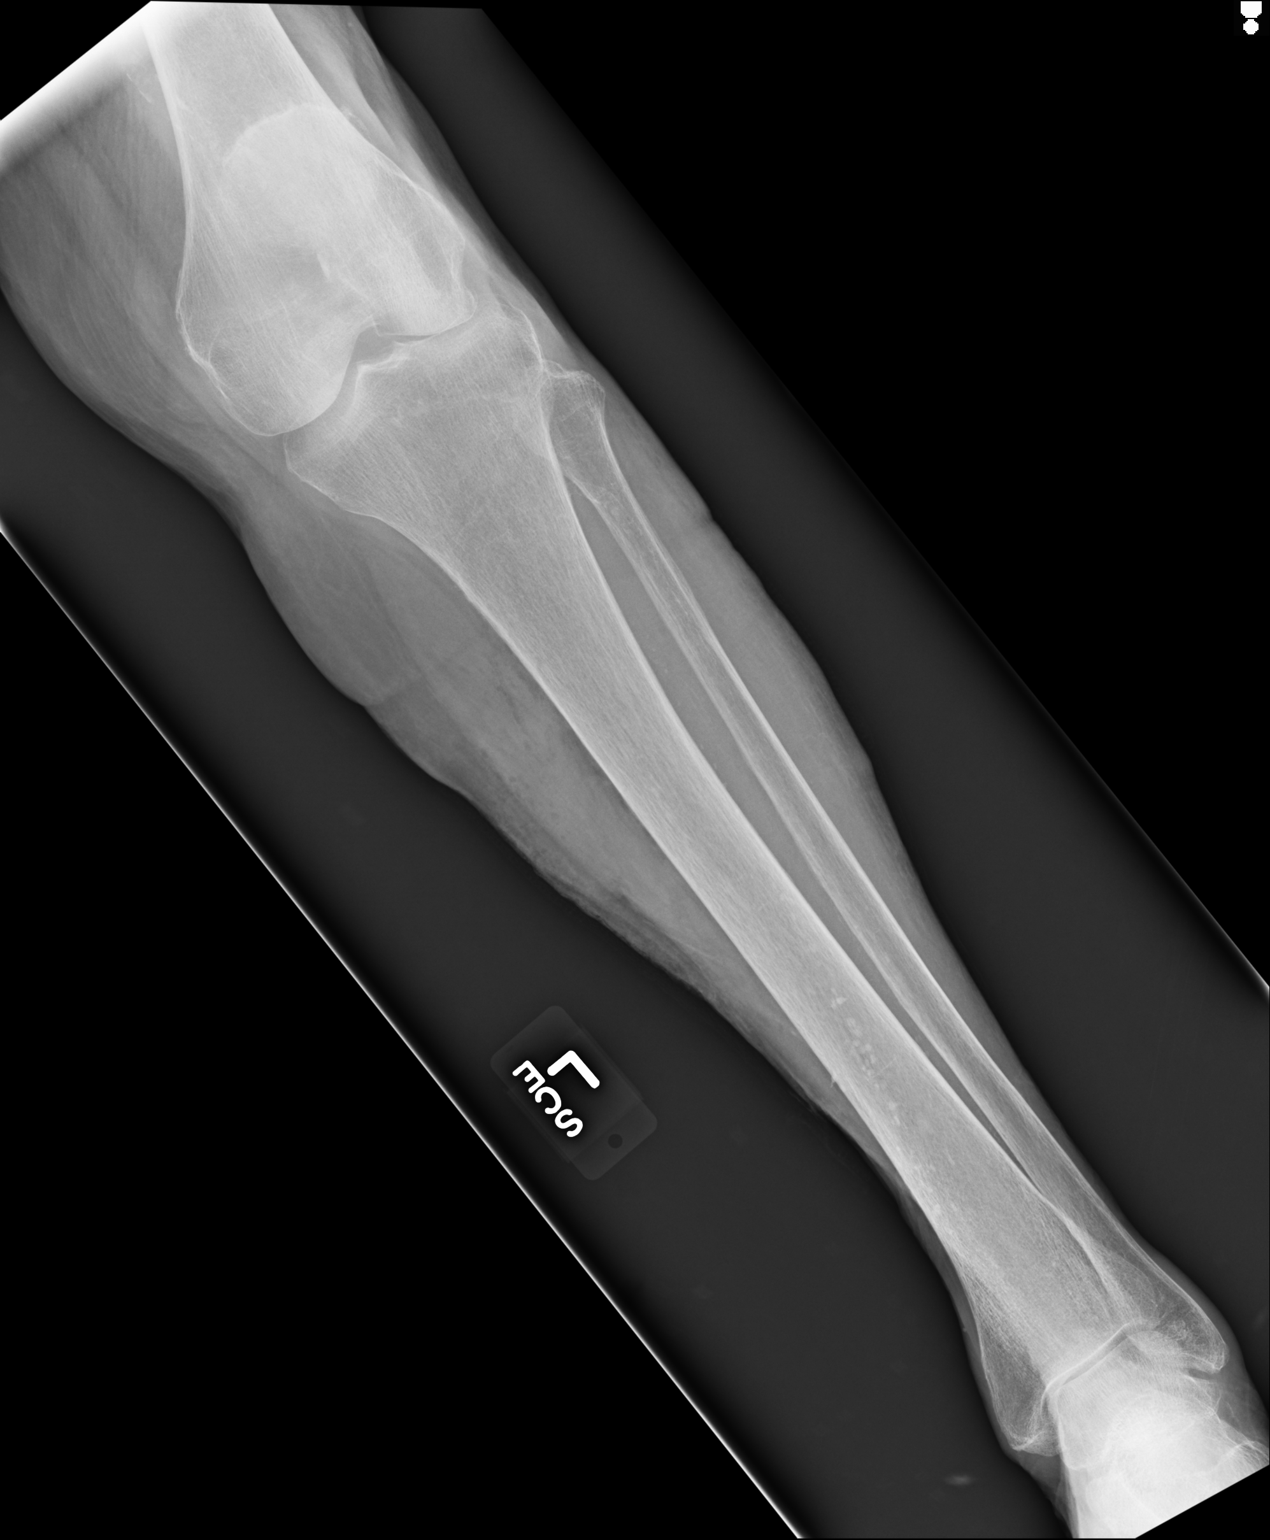
[im 2/2]
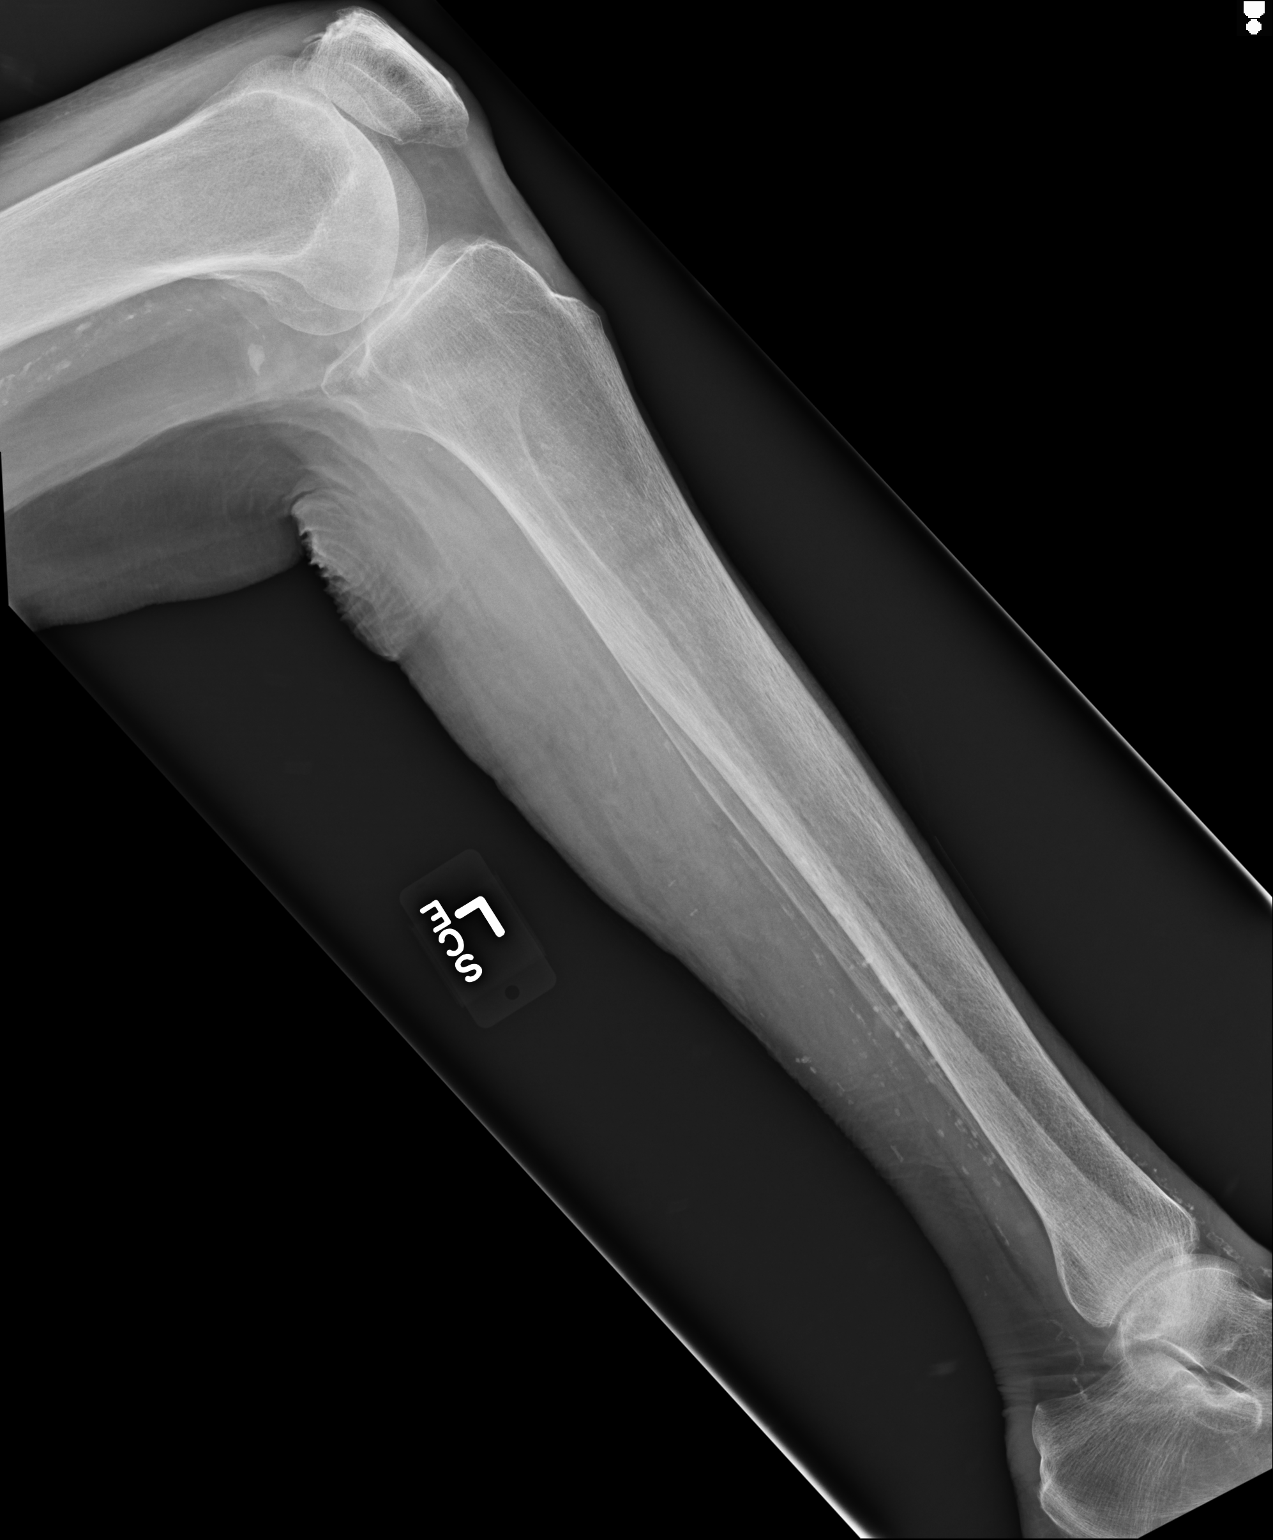

[2 of 2 positions shown; findings below may reference images not displayed]

FINDINGS: There is no evidence of fracture or other focal bone lesions. Soft
tissues are unremarkable.
IMPRESSION: Negative.

## 2016-12-20 IMAGING — CT CT CERVICAL SPINE W/O CM
4 of 8 series · 13 of 33 positions shown, 14 images · non-contrast
Comparison: CT of the head performed 09/14/2014, and CT of the
cervical spine performed 08/02/2014

CLINICAL DATA: Status post fall, with forehead hematoma. Concern
for cervical spine injury. Initial encounter.

EXAM:
CT HEAD WITHOUT CONTRAST
CT CERVICAL SPINE WITHOUT CONTRAST
TECHNIQUE: Multidetector CT imaging of the head and cervical spine was
performed following the standard protocol without intravenous
contrast. Multiplanar CT image reconstructions of the cervical spine
were also generated.

[Series 2: head bone · axial · 0.43mm/px · z∈[-62,+6]mm · 3 of 69 slices shown]
[im 18/69  bone]
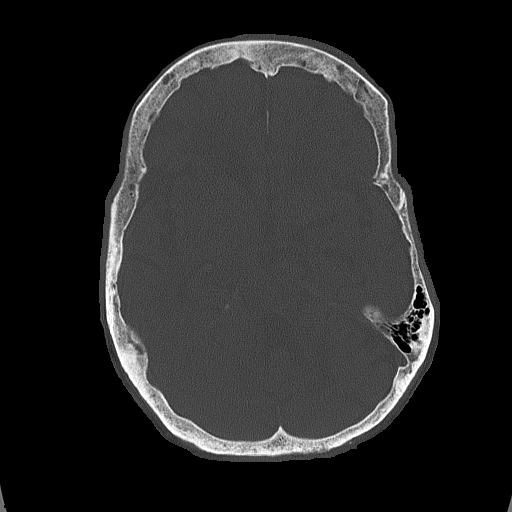
[im 35/69  bone]
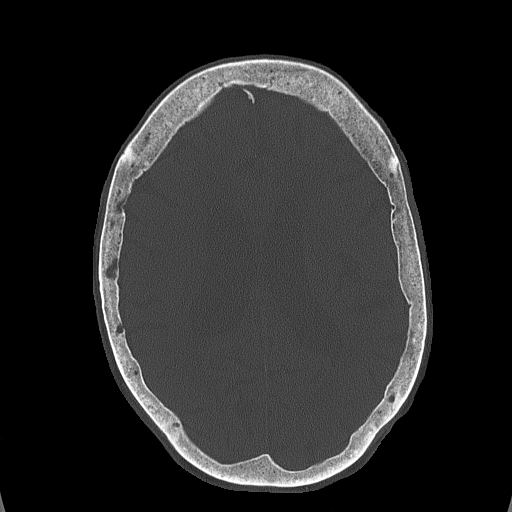
[im 52/69  bone]
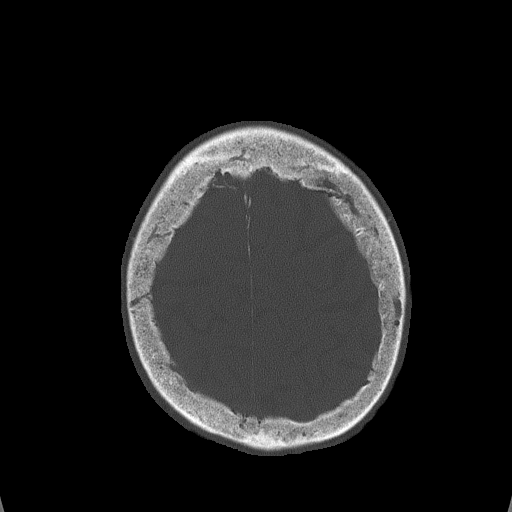

[Series 5: coronal soft tissue · coronal · 0.27mm/px · 3 of 66 slices shown]
[im 17/66  bone]
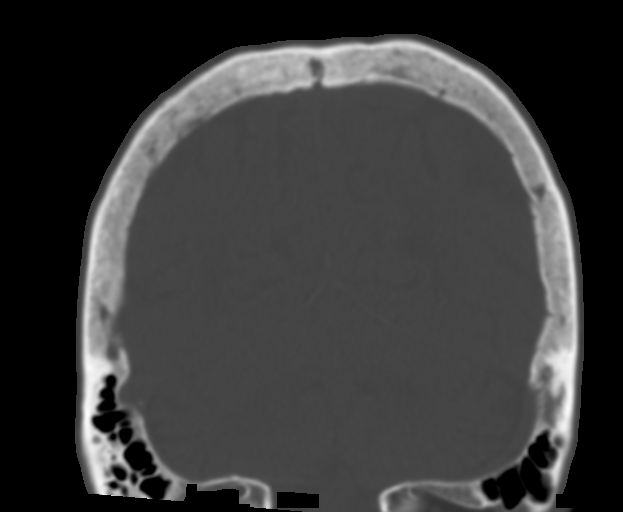
[im 33/66  bone]
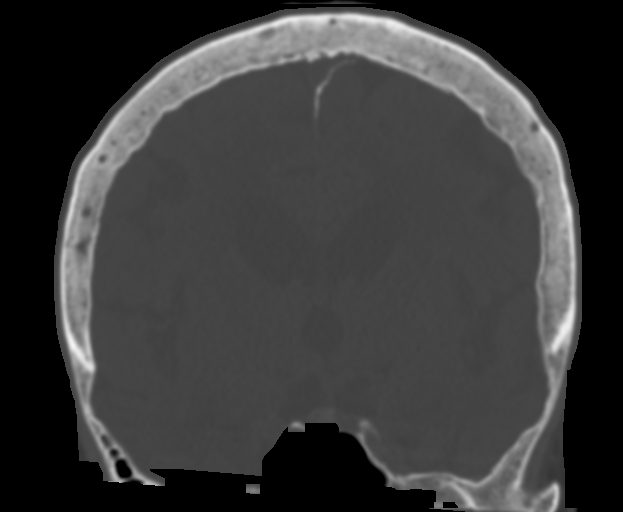
[im 49/66  bone]
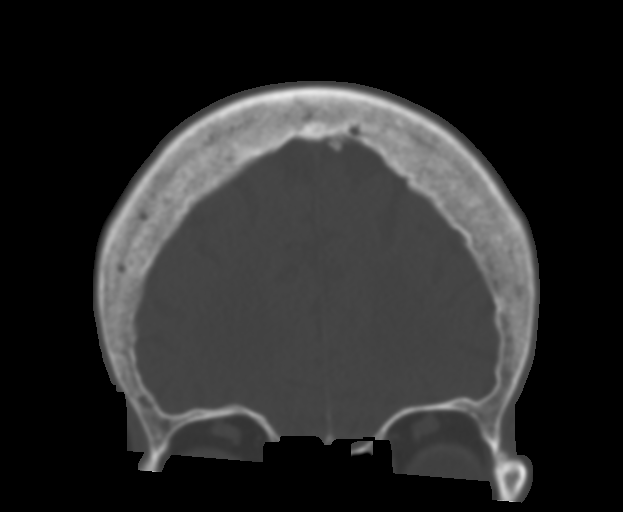

[Series 9: sagittal bone · sagittal · 0.25mm/px · 4 of 40 slices shown]
[im 8/40  bone]
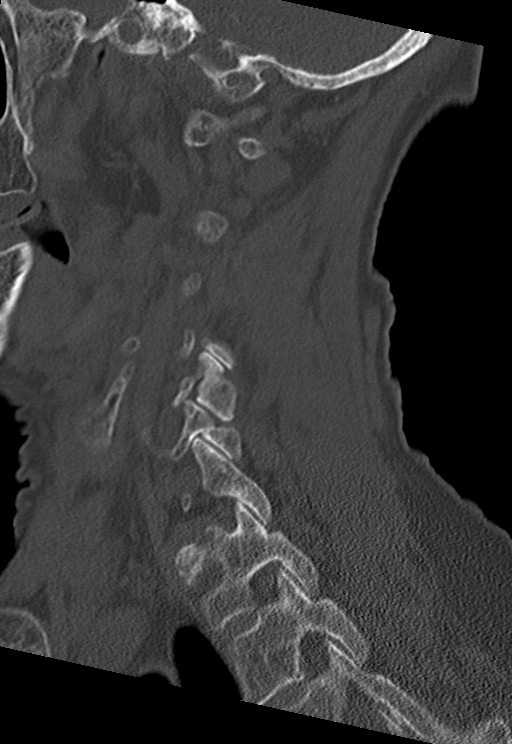
[im 16/40  bone]
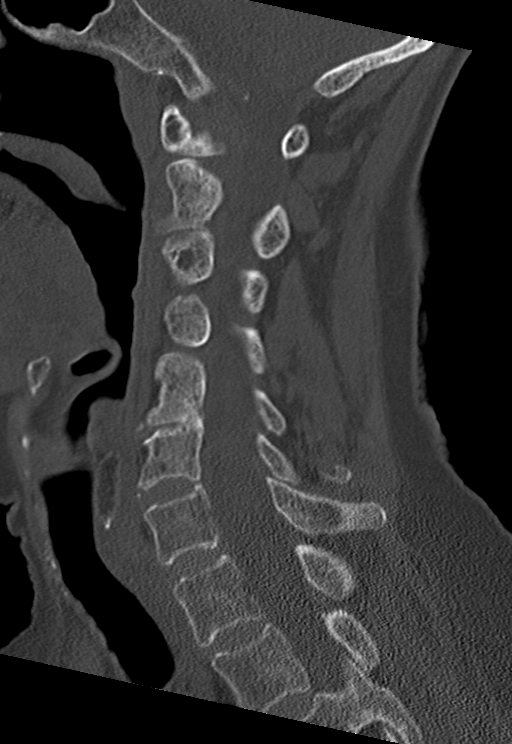
[im 24/40  bone]
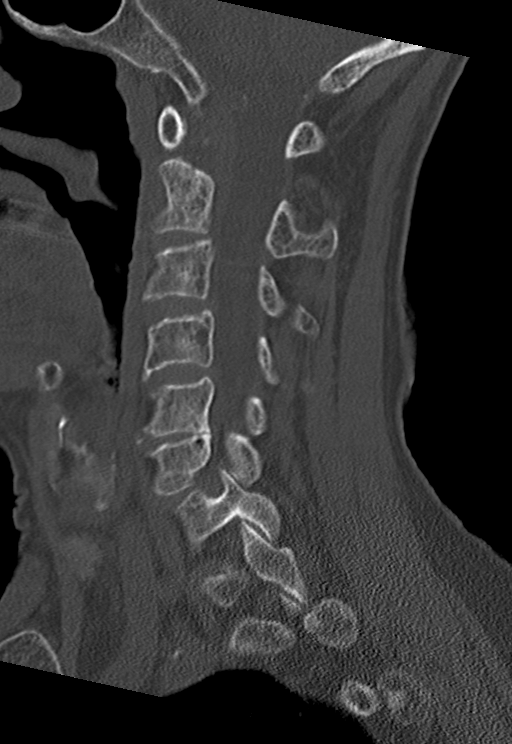
[im 32/40  bone]
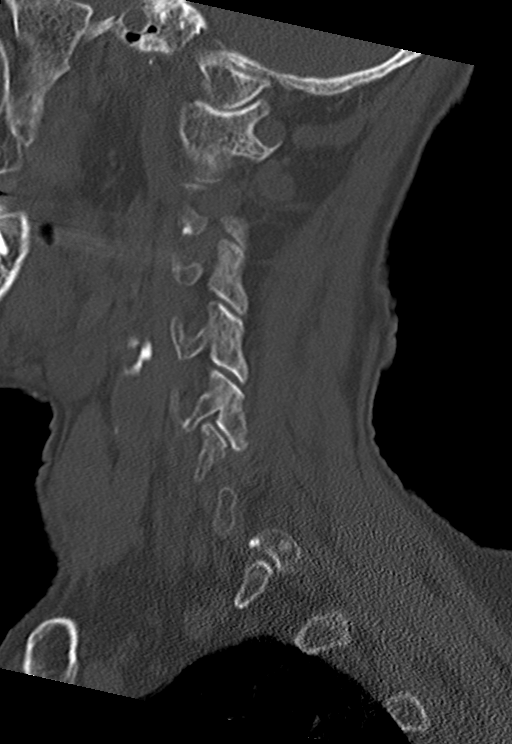

[Series 11: orthogonal bone · axial · 0.27mm/px · z∈[-269,-172]mm · 3 of 105 slices shown, 4 images]
[im 27/105  soft-tissue]
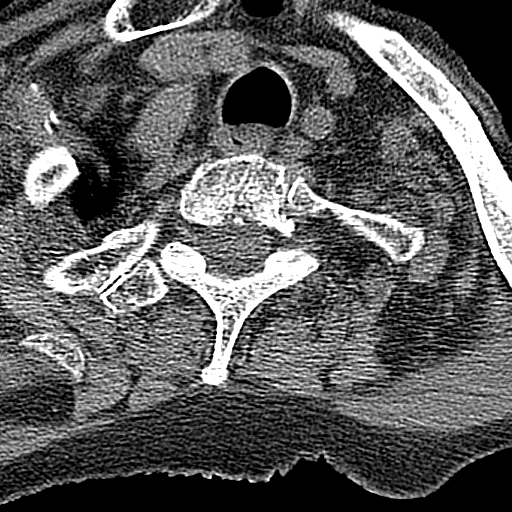
[im 27/105  bone]
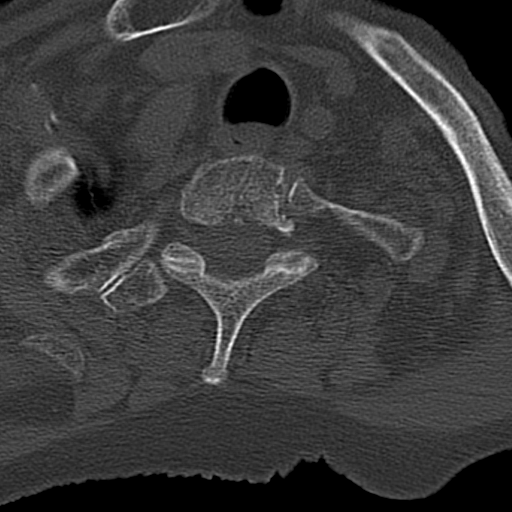
[im 53/105  bone]
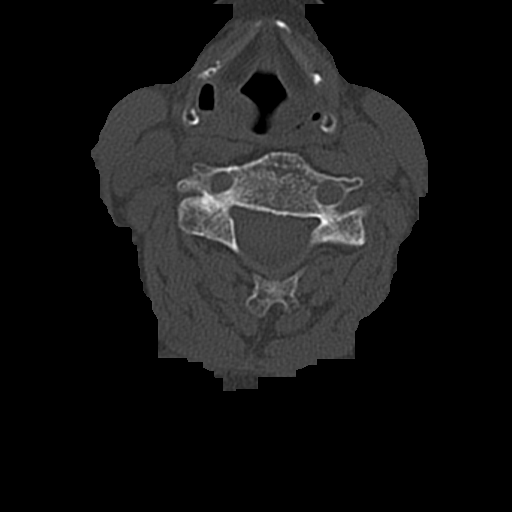
[im 79/105  bone]
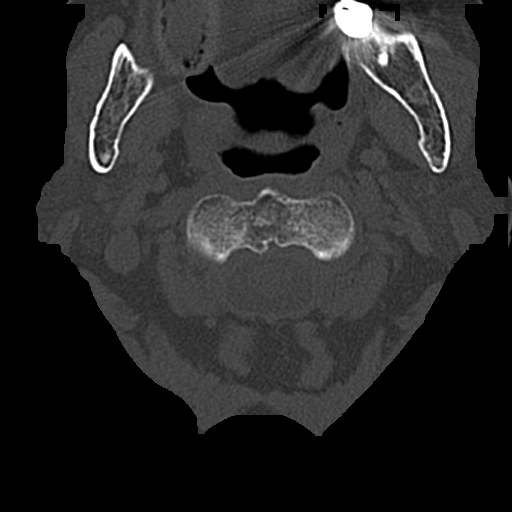

[13 of 33 positions shown; findings below may reference images not displayed]

FINDINGS: CT HEAD FINDINGS

There is no evidence of acute infarction, mass lesion, or intra- or
extra-axial hemorrhage on CT.

Prominence of the ventricles and sulci reflects mild to moderate
cortical volume loss. Scattered periventricular and subcortical
white matter change likely reflects small vessel ischemic
microangiopathy. A small chronic infarct is seen at the left
cerebellar hemisphere.

The brainstem and fourth ventricle are within normal limits. The
basal ganglia are unremarkable in appearance. The cerebral
hemispheres demonstrate grossly normal gray-white differentiation.
No mass effect or midline shift is seen.

There is no evidence of fracture; there is minimal nonspecific
heterogeneity of visualized osseous structures. The visualized
portions of the orbits are within normal limits. The paranasal
sinuses and mastoid air cells are well-aerated. Soft tissue swelling
is noted overlying the frontal calvarium.

CT CERVICAL SPINE FINDINGS

There is no evidence of fracture or subluxation. Vertebral bodies
demonstrate normal height and alignment. Intervertebral disc space
narrowing is noted at C5-C6, with a small anterior disc osteophyte
complex. Prevertebral soft tissues are within normal limits. The
visualized neural foramina are grossly unremarkable.

The thyroid gland is unremarkable in appearance. The visualized lung
apices are clear. Calcification is noted at the carotid bifurcations
bilaterally, more prominent on the left. There is likely mild to
moderate luminal narrowing at the proximal left internal carotid
artery.
IMPRESSION: 1. No evidence of traumatic intracranial injury or fracture.
2. No evidence of fracture or subluxation along the cervical spine.
3. Soft tissue swelling overlying the frontal calvarium.
4. Mild to moderate cortical volume loss and scattered small vessel
ischemic microangiopathy.
5. Small chronic infarct at the left cerebellar hemisphere.
6. Calcification at the carotid bifurcations bilaterally, more
prominent on the left. There is likely mild to moderate luminal
narrowing at the proximal left internal carotid artery. Carotid
ultrasound would be helpful for further evaluation, when and as
deemed clinically appropriate.
